# Patient Record
Sex: Female | Born: 1955 | Race: White | Hispanic: No | Smoking: Never smoker
Health system: Southern US, Community
[De-identification: ages and names within clinical notes are randomized; demographics above are authoritative.]

## PROBLEM LIST (undated history)

## (undated) DIAGNOSIS — G8929 Other chronic pain: Secondary | ICD-10-CM

## (undated) DIAGNOSIS — Z972 Presence of dental prosthetic device (complete) (partial): Secondary | ICD-10-CM

## (undated) DIAGNOSIS — M797 Fibromyalgia: Secondary | ICD-10-CM

## (undated) DIAGNOSIS — I10 Essential (primary) hypertension: Secondary | ICD-10-CM

## (undated) DIAGNOSIS — G43909 Migraine, unspecified, not intractable, without status migrainosus: Secondary | ICD-10-CM

## (undated) DIAGNOSIS — R5382 Chronic fatigue, unspecified: Secondary | ICD-10-CM

## (undated) DIAGNOSIS — C801 Malignant (primary) neoplasm, unspecified: Secondary | ICD-10-CM

## (undated) HISTORY — PX: ABDOMINAL HYSTERECTOMY: SHX81

## (undated) HISTORY — PX: NASAL SINUS SURGERY: SHX719

## (undated) HISTORY — DX: Essential (primary) hypertension: I10

---

## 2005-12-23 ENCOUNTER — Ambulatory Visit: Payer: Self-pay | Admitting: Internal Medicine

## 2006-04-10 ENCOUNTER — Ambulatory Visit: Payer: Self-pay | Admitting: Podiatry

## 2007-04-29 ENCOUNTER — Ambulatory Visit: Payer: Self-pay | Admitting: Internal Medicine

## 2007-11-22 ENCOUNTER — Ambulatory Visit: Payer: Self-pay | Admitting: Unknown Physician Specialty

## 2007-12-16 ENCOUNTER — Ambulatory Visit: Payer: Self-pay | Admitting: Unknown Physician Specialty

## 2008-04-03 ENCOUNTER — Ambulatory Visit: Payer: Self-pay | Admitting: Internal Medicine

## 2008-05-05 ENCOUNTER — Ambulatory Visit: Payer: Self-pay | Admitting: Internal Medicine

## 2009-05-08 ENCOUNTER — Ambulatory Visit: Payer: Self-pay | Admitting: Internal Medicine

## 2010-05-21 ENCOUNTER — Ambulatory Visit: Payer: Self-pay | Admitting: Internal Medicine

## 2011-05-22 ENCOUNTER — Ambulatory Visit: Payer: Self-pay | Admitting: Internal Medicine

## 2011-06-03 ENCOUNTER — Ambulatory Visit: Payer: Self-pay | Admitting: Internal Medicine

## 2012-07-05 ENCOUNTER — Ambulatory Visit: Payer: Self-pay | Admitting: Internal Medicine

## 2013-07-21 ENCOUNTER — Ambulatory Visit: Payer: Self-pay | Admitting: Internal Medicine

## 2014-09-05 ENCOUNTER — Ambulatory Visit: Payer: Self-pay | Admitting: Internal Medicine

## 2014-09-18 ENCOUNTER — Ambulatory Visit: Payer: Self-pay | Admitting: Internal Medicine

## 2014-09-18 ENCOUNTER — Emergency Department: Payer: Self-pay | Admitting: Emergency Medicine

## 2014-09-26 ENCOUNTER — Ambulatory Visit: Payer: Self-pay | Admitting: Internal Medicine

## 2014-09-26 HISTORY — PX: BREAST BIOPSY: SHX20

## 2014-10-26 ENCOUNTER — Ambulatory Visit
Admit: 2014-10-26 | Disposition: A | Discharge status: Home / Self Care | Payer: Self-pay | Attending: Surgery | Admitting: Surgery

## 2014-10-26 DIAGNOSIS — I10 Essential (primary) hypertension: Secondary | ICD-10-CM

## 2014-10-26 LAB — CBC WITH DIFFERENTIAL/PLATELET
BASOS PCT: 0.7 %
Basophil #: 0.1 10*3/uL (ref 0.0–0.1)
EOS ABS: 0.2 10*3/uL (ref 0.0–0.7)
Eosinophil %: 2.3 %
HCT: 39.1 % (ref 35.0–47.0)
HGB: 13.4 g/dL (ref 12.0–16.0)
Lymphocyte #: 2 10*3/uL (ref 1.0–3.6)
Lymphocyte %: 27.7 %
MCH: 30.5 pg (ref 26.0–34.0)
MCHC: 34.2 g/dL (ref 32.0–36.0)
MCV: 89 fL (ref 80–100)
MONO ABS: 0.4 x10 3/mm (ref 0.2–0.9)
Monocyte %: 5.7 %
NEUTROS PCT: 63.6 %
Neutrophil #: 4.6 10*3/uL (ref 1.4–6.5)
Platelet: 239 10*3/uL (ref 150–440)
RBC: 4.39 10*6/uL (ref 3.80–5.20)
RDW: 12.9 % (ref 11.5–14.5)
WBC: 7.2 10*3/uL (ref 3.6–11.0)

## 2014-10-26 LAB — BASIC METABOLIC PANEL
ANION GAP: 5 — AB (ref 7–16)
BUN: 12 mg/dL
Calcium, Total: 9.9 mg/dL
Chloride: 101 mmol/L
Co2: 34 mmol/L — ABNORMAL HIGH
Creatinine: 0.72 mg/dL
EGFR (Non-African Amer.): >60
GLUCOSE: 104 mg/dL — AB
POTASSIUM: 3.8 mmol/L
SODIUM: 140 mmol/L

## 2014-11-02 ENCOUNTER — Ambulatory Visit
Admit: 2014-11-02 | Disposition: A | Discharge status: Home / Self Care | Payer: Self-pay | Attending: Surgery | Admitting: Surgery

## 2014-11-02 HISTORY — PX: BREAST BIOPSY: SHX20

## 2014-11-06 LAB — SURGICAL PATHOLOGY

## 2014-11-12 NOTE — Op Note (Signed)
PATIENT NAME:  Carla Gomez, Carla Gomez MR#:  [id number] DATE OF BIRTH:  05/31/1956  DATE OF PROCEDURE:  11/02/2014  PREOPERATIVE DIAGNOSIS: Left breast intraductal papilloma.   POSTOPERATIVE DIAGNOSIS: Left breast intraductal papilloma.   PROCEDURE PERFORMED: Left breast needle localization, excisional biopsy.   SURGEON: Erick Murin A. Marina Gravel, MD   ASSISTANT: None.   ANESTHESIA: General with local, 30 mL of 0.25% plain Marcaine.   FINDINGS: Specimen mammogram was sufficient.   ESTIMATED BLOOD LOSS: Minimal.   DESCRIPTION OF PROCEDURE: With the patient was brought to the Operating Room. The left breast was sterilely prepped and draped with ChloraPrep solution. Timeout was observed. A 3.5 cm curvilinear incision in the left inner lower quadrant was fashioned. Subdermal flaps were raised. The wire was brought through this incision. Stay suture of 3-0 Vicryl was placed. Excisional biopsy was then performed in the direction of the wire path. Specimen radiograph was sufficient.   Two hemoclips were placed at the base of the wound for future mammographic detection.   With hemostasis being ensured. A total of 30 mL of 0.25% plain Marcaine was infiltrated along the operative field.   The wound was then closed in multiple layers of 3-0 Vicryl and 4-0 running subcuticular in the skin with 4-0 Vicryl. Steri-Strips, Telfa and Tegaderm were then applied and the patient was subsequently extubated and taken to the recovery room in stable and satisfactory condition by anesthesia services.    ____________________________ Jeannette How Marina Gravel, MD mab:AT D: 11/02/2014 10:50:57 ET T: 11/02/2014 14:55:54 ET JOB#: [id number]  cc: Elta Guadeloupe A. Marina Gravel, MD, <Dictator> Leona Carry. Hall Busing, MD Deziah Renwick Bettina Gavia MD ELECTRONICALLY SIGNED 11/08/2014 12:37

## 2015-08-27 ENCOUNTER — Other Ambulatory Visit: Payer: Self-pay | Admitting: Internal Medicine

## 2015-08-27 DIAGNOSIS — Z1231 Encounter for screening mammogram for malignant neoplasm of breast: Secondary | ICD-10-CM

## 2015-09-10 ENCOUNTER — Ambulatory Visit: Payer: Self-pay

## 2015-09-11 ENCOUNTER — Ambulatory Visit
Admission: RE | Admit: 2015-09-11 | Discharge: 2015-09-11 | Disposition: A | Discharge status: Home / Self Care | Payer: Medicare Other | Source: Ambulatory Visit | Attending: Internal Medicine | Admitting: Internal Medicine

## 2015-09-11 DIAGNOSIS — Z1231 Encounter for screening mammogram for malignant neoplasm of breast: Secondary | ICD-10-CM | POA: Diagnosis present

## 2015-09-11 HISTORY — DX: Malignant (primary) neoplasm, unspecified: C80.1

## 2016-06-02 ENCOUNTER — Other Ambulatory Visit: Payer: Self-pay | Admitting: Internal Medicine

## 2016-06-02 DIAGNOSIS — R891 Abnormal level of hormones in specimens from other organs, systems and tissues: Secondary | ICD-10-CM

## 2016-06-02 DIAGNOSIS — R7989 Other specified abnormal findings of blood chemistry: Secondary | ICD-10-CM

## 2016-06-02 DIAGNOSIS — Z780 Asymptomatic menopausal state: Secondary | ICD-10-CM

## 2016-06-10 ENCOUNTER — Ambulatory Visit: Admission: RE | Admit: 2016-06-10 | Payer: Medicare Other | Source: Ambulatory Visit

## 2016-06-22 IMAGING — MG MM DIGITAL SCREENING BILAT W/ CAD
2 series · 5 of 5 positions shown · non-contrast
Comparison: Previous exam(s).

CLINICAL DATA: Screening.

EXAM:
DIGITAL SCREENING BILATERAL MAMMOGRAM WITH CAD

[R CC · right · 4 of 4 slices shown]
[im 1/4]
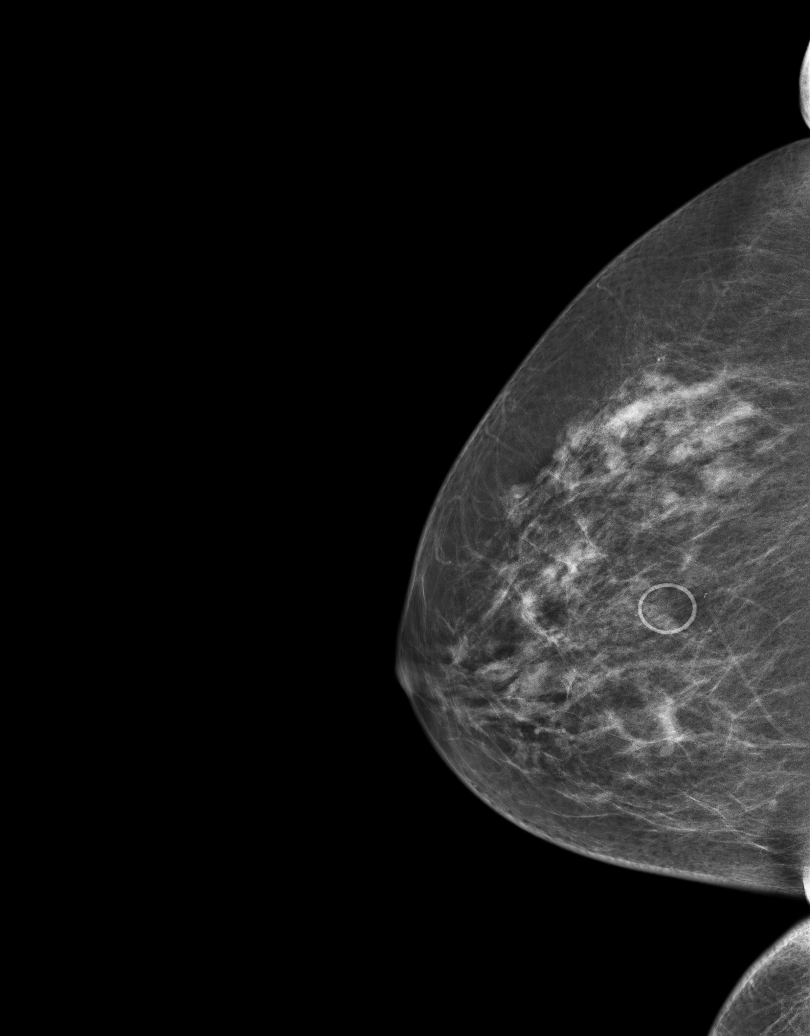
[im 2/4]
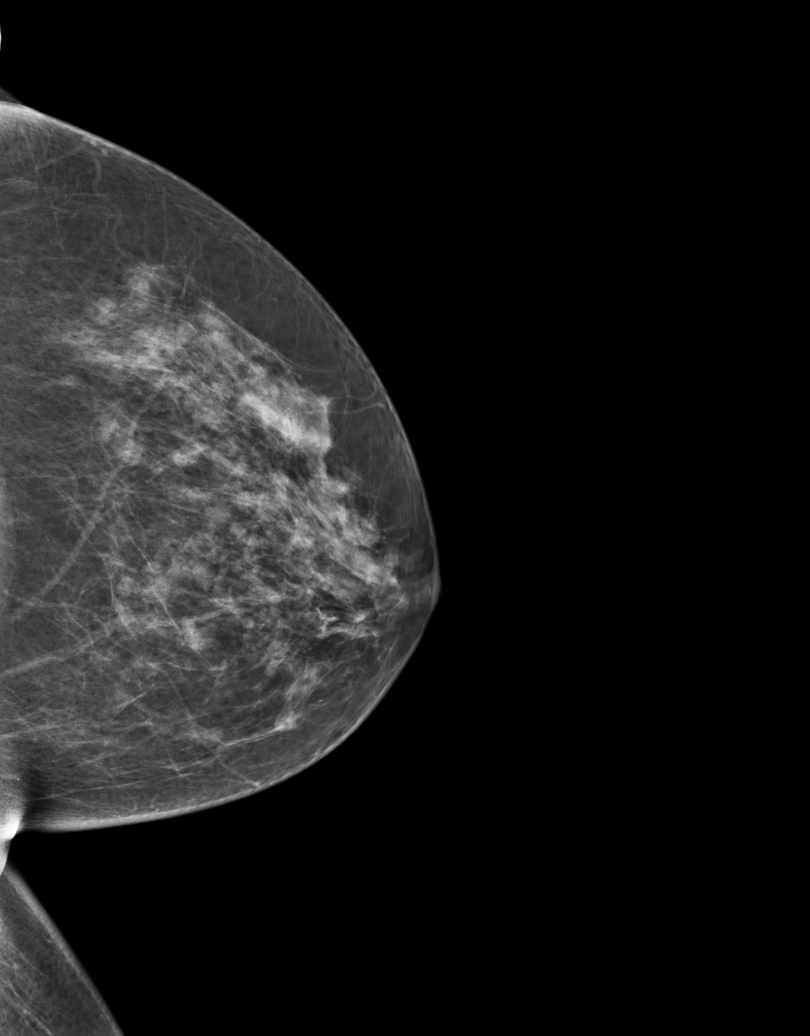
[im 3/4]
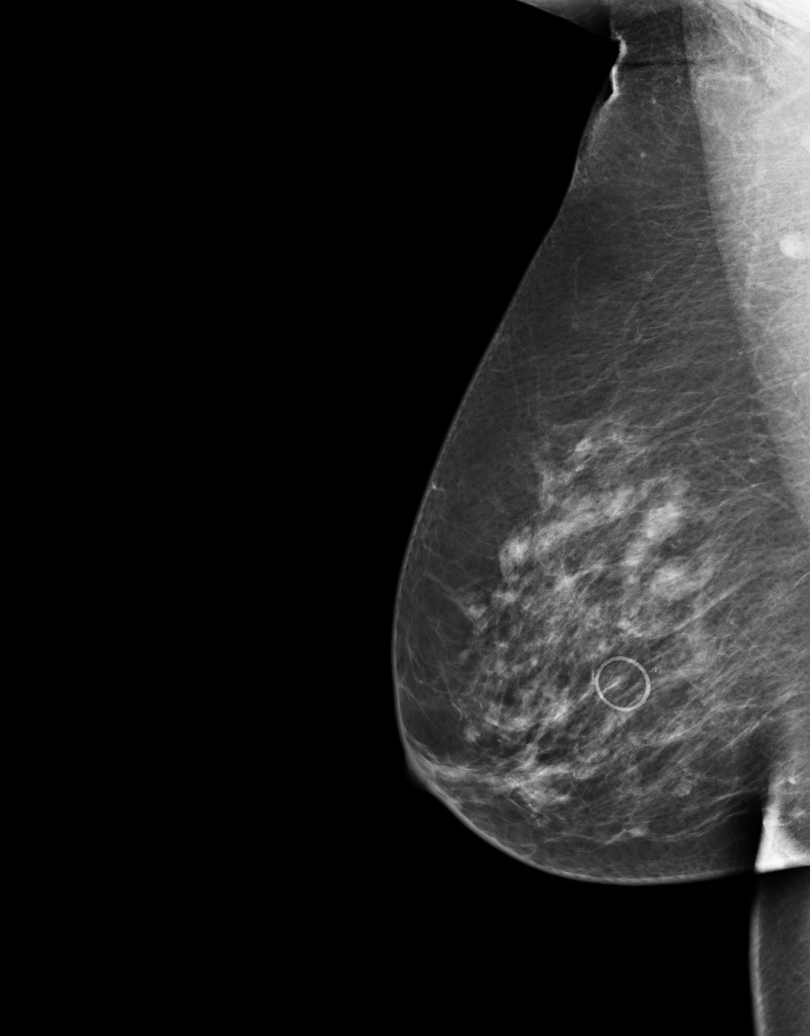
[im 4/4]
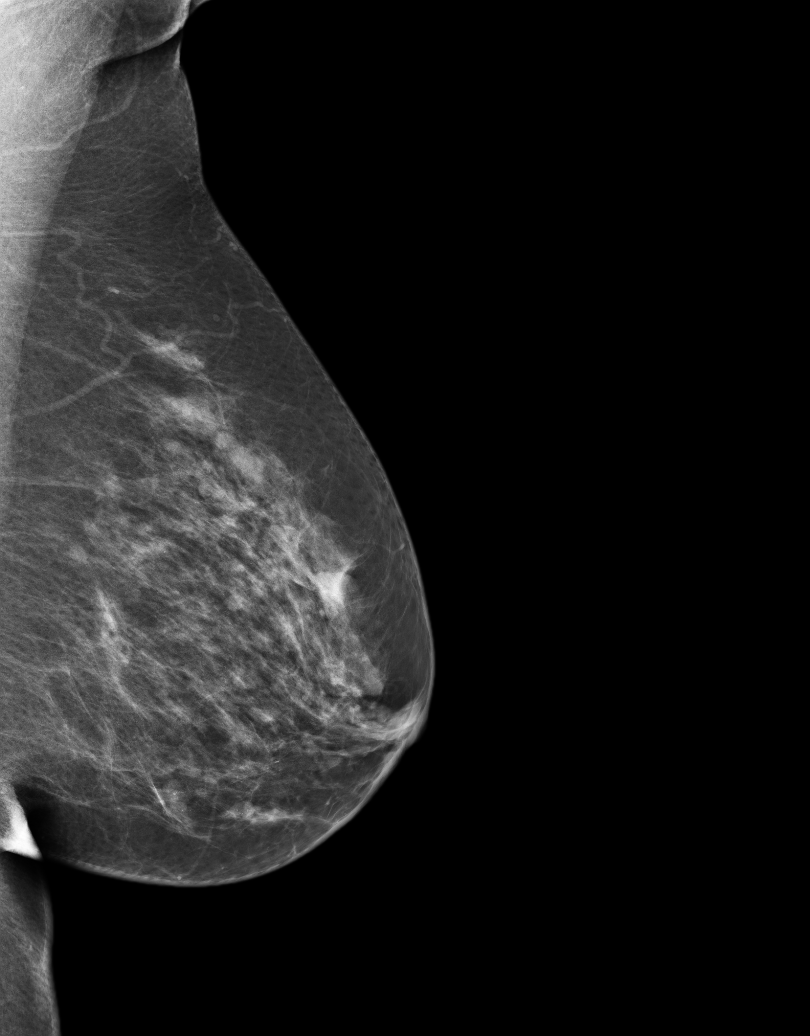

[L MLO]
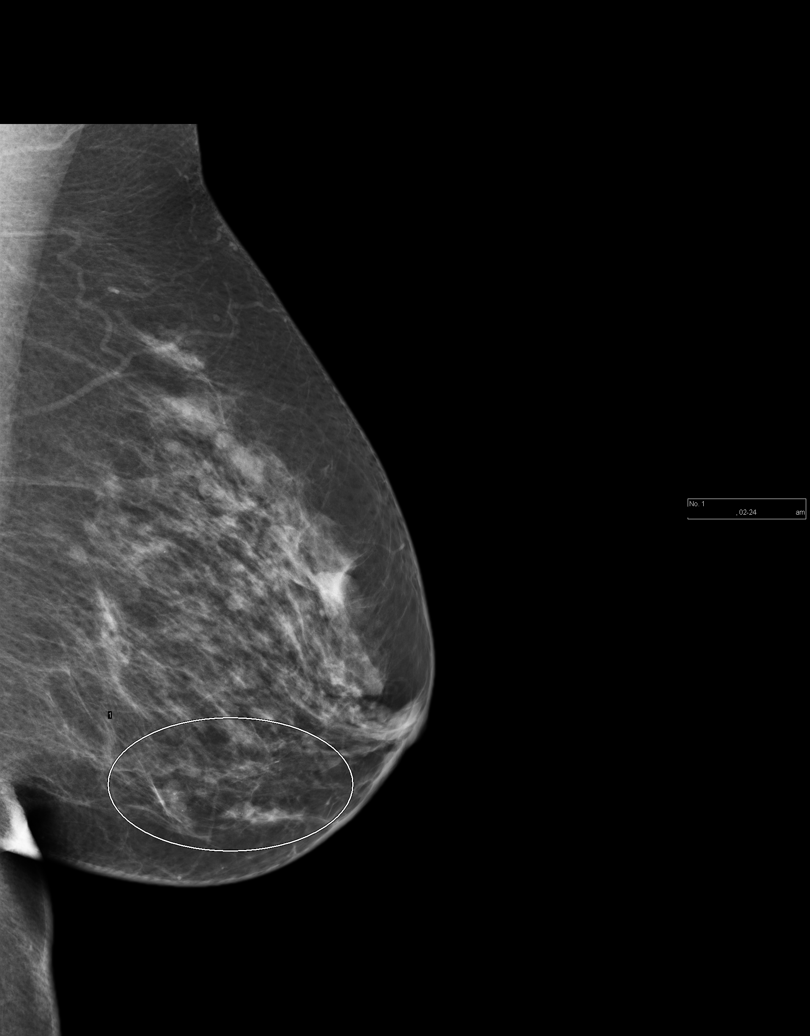

[5 of 5 positions shown; findings below may reference images not displayed]

ACR Breast Density Category b: There are scattered areas of
fibroglandular density.
FINDINGS: In the left breast, calcifications warrant further evaluation with
magnified views. In the right breast, no findings suspicious for
malignancy. Images were processed with CAD.
IMPRESSION: Further evaluation is suggested for calcifications in the left
breast.

RECOMMENDATION:
Diagnostic mammogram of the left breast. (Code:[id number]-[id number])

The patient will be contacted regarding the findings, and additional
imaging will be scheduled.

BI-RADS CATEGORY  0: Incomplete. Need additional imaging evaluation
and/or prior mammograms for comparison.

## 2016-09-23 ENCOUNTER — Other Ambulatory Visit: Payer: Self-pay | Admitting: Internal Medicine

## 2016-09-23 DIAGNOSIS — Z780 Asymptomatic menopausal state: Secondary | ICD-10-CM

## 2016-09-23 DIAGNOSIS — Z1231 Encounter for screening mammogram for malignant neoplasm of breast: Secondary | ICD-10-CM

## 2016-10-01 ENCOUNTER — Ambulatory Visit: Payer: Medicare Other

## 2016-10-01 ENCOUNTER — Other Ambulatory Visit: Payer: Medicare Other

## 2016-10-06 ENCOUNTER — Inpatient Hospital Stay: Admission: RE | Admit: 2016-10-06 | Payer: Medicare Other | Source: Ambulatory Visit

## 2016-10-06 ENCOUNTER — Ambulatory Visit: Payer: Medicare Other

## 2016-10-07 ENCOUNTER — Ambulatory Visit
Admission: RE | Admit: 2016-10-07 | Discharge: 2016-10-07 | Disposition: A | Discharge status: Home / Self Care | Payer: Medicare Other | Source: Ambulatory Visit | Attending: Internal Medicine | Admitting: Internal Medicine

## 2016-10-07 DIAGNOSIS — Z1231 Encounter for screening mammogram for malignant neoplasm of breast: Secondary | ICD-10-CM | POA: Diagnosis not present

## 2016-10-07 DIAGNOSIS — Z780 Asymptomatic menopausal state: Secondary | ICD-10-CM | POA: Diagnosis present

## 2017-10-05 ENCOUNTER — Other Ambulatory Visit: Payer: Self-pay | Admitting: Internal Medicine

## 2017-10-05 DIAGNOSIS — Z1231 Encounter for screening mammogram for malignant neoplasm of breast: Secondary | ICD-10-CM

## 2017-10-13 ENCOUNTER — Ambulatory Visit
Admission: RE | Admit: 2017-10-13 | Discharge: 2017-10-13 | Disposition: A | Discharge status: Home / Self Care | Payer: Medicare Other | Source: Ambulatory Visit | Attending: Internal Medicine | Admitting: Internal Medicine

## 2017-10-13 DIAGNOSIS — R928 Other abnormal and inconclusive findings on diagnostic imaging of breast: Secondary | ICD-10-CM | POA: Diagnosis not present

## 2017-10-13 DIAGNOSIS — Z1231 Encounter for screening mammogram for malignant neoplasm of breast: Secondary | ICD-10-CM | POA: Diagnosis present

## 2017-10-15 ENCOUNTER — Other Ambulatory Visit: Payer: Self-pay | Admitting: Internal Medicine

## 2017-10-15 DIAGNOSIS — N6320 Unspecified lump in the left breast, unspecified quadrant: Secondary | ICD-10-CM

## 2017-10-15 DIAGNOSIS — R928 Other abnormal and inconclusive findings on diagnostic imaging of breast: Secondary | ICD-10-CM

## 2017-10-22 ENCOUNTER — Ambulatory Visit
Admission: RE | Admit: 2017-10-22 | Discharge: 2017-10-22 | Disposition: A | Discharge status: Home / Self Care | Payer: Medicare Other | Source: Ambulatory Visit | Attending: Internal Medicine | Admitting: Internal Medicine

## 2017-10-22 DIAGNOSIS — N6320 Unspecified lump in the left breast, unspecified quadrant: Secondary | ICD-10-CM

## 2017-10-22 DIAGNOSIS — R928 Other abnormal and inconclusive findings on diagnostic imaging of breast: Secondary | ICD-10-CM

## 2018-06-21 ENCOUNTER — Other Ambulatory Visit: Payer: Self-pay | Admitting: Internal Medicine

## 2018-06-21 DIAGNOSIS — R109 Unspecified abdominal pain: Secondary | ICD-10-CM

## 2018-06-23 ENCOUNTER — Encounter (INDEPENDENT_AMBULATORY_CARE_PROVIDER_SITE_OTHER): Payer: Self-pay

## 2018-06-23 ENCOUNTER — Ambulatory Visit
Admission: RE | Admit: 2018-06-23 | Discharge: 2018-06-23 | Disposition: A | Discharge status: Home / Self Care | Payer: Medicare Other | Source: Ambulatory Visit | Attending: Internal Medicine | Admitting: Internal Medicine

## 2018-06-23 DIAGNOSIS — K8020 Calculus of gallbladder without cholecystitis without obstruction: Secondary | ICD-10-CM | POA: Diagnosis not present

## 2018-06-23 DIAGNOSIS — K828 Other specified diseases of gallbladder: Secondary | ICD-10-CM | POA: Diagnosis not present

## 2018-06-23 DIAGNOSIS — R109 Unspecified abdominal pain: Secondary | ICD-10-CM | POA: Diagnosis not present

## 2020-01-13 ENCOUNTER — Other Ambulatory Visit: Payer: Self-pay | Admitting: Internal Medicine

## 2020-01-13 DIAGNOSIS — Z1231 Encounter for screening mammogram for malignant neoplasm of breast: Secondary | ICD-10-CM

## 2020-01-13 DIAGNOSIS — E2839 Other primary ovarian failure: Secondary | ICD-10-CM

## 2020-01-25 ENCOUNTER — Other Ambulatory Visit: Payer: Self-pay

## 2020-01-25 ENCOUNTER — Ambulatory Visit
Admission: RE | Admit: 2020-01-25 | Discharge: 2020-01-25 | Disposition: A | Discharge status: Home / Self Care | Payer: Medicare Other | Source: Ambulatory Visit | Attending: Internal Medicine | Admitting: Internal Medicine

## 2020-01-25 DIAGNOSIS — E2839 Other primary ovarian failure: Secondary | ICD-10-CM | POA: Diagnosis not present

## 2020-01-25 DIAGNOSIS — Z13820 Encounter for screening for osteoporosis: Secondary | ICD-10-CM | POA: Insufficient documentation

## 2020-01-25 DIAGNOSIS — Z1231 Encounter for screening mammogram for malignant neoplasm of breast: Secondary | ICD-10-CM | POA: Diagnosis present

## 2020-01-25 DIAGNOSIS — Z780 Asymptomatic menopausal state: Secondary | ICD-10-CM | POA: Insufficient documentation

## 2020-05-09 ENCOUNTER — Other Ambulatory Visit: Payer: Self-pay | Admitting: Internal Medicine

## 2020-05-09 DIAGNOSIS — R4182 Altered mental status, unspecified: Secondary | ICD-10-CM

## 2020-05-16 ENCOUNTER — Ambulatory Visit
Admission: RE | Admit: 2020-05-16 | Discharge: 2020-05-16 | Disposition: A | Discharge status: Home / Self Care | Payer: Medicare Other | Source: Ambulatory Visit | Attending: Internal Medicine | Admitting: Internal Medicine

## 2020-05-16 ENCOUNTER — Other Ambulatory Visit: Payer: Self-pay

## 2020-05-16 ENCOUNTER — Encounter (INDEPENDENT_AMBULATORY_CARE_PROVIDER_SITE_OTHER): Payer: Self-pay

## 2020-05-16 DIAGNOSIS — R4182 Altered mental status, unspecified: Secondary | ICD-10-CM | POA: Diagnosis not present

## 2020-11-07 ENCOUNTER — Encounter: Payer: Self-pay | Admitting: Obstetrics & Gynecology

## 2020-11-07 ENCOUNTER — Ambulatory Visit: Payer: Medicare Other | Admitting: Obstetrics & Gynecology

## 2020-11-07 ENCOUNTER — Other Ambulatory Visit: Payer: Self-pay

## 2020-11-07 VITALS — BP 140/90 | Ht 64.0 in | Wt 160.0 lb

## 2020-11-07 DIAGNOSIS — N907 Vulvar cyst: Secondary | ICD-10-CM | POA: Insufficient documentation

## 2020-11-07 DIAGNOSIS — N8111 Cystocele, midline: Secondary | ICD-10-CM | POA: Insufficient documentation

## 2020-11-07 DIAGNOSIS — R1032 Left lower quadrant pain: Secondary | ICD-10-CM | POA: Insufficient documentation

## 2020-11-07 NOTE — Progress Notes (Signed)
Abdominal Pain Patient is a 65 yo G2P2 WF who presents for evaluation of abdominal pain. The pain is described as sharp and shooting, and is 7/10 in intensity. Pain is located in the LLQ area without radiation. Onset was cyclical occurring for about one week each month predictably and starting/ occurring several years ago. Symptoms have been gradually worsening since. Aggravating factors: none. Alleviating factors: none. Associated symptoms: severe migraine type headaches during this week as well, also bloating.  The patient denies constipation, diarrhea, fever, nausea and vomiting. Risk factors for pelvic/abdominal pain include prior h/o ovarian cysts when younger.  She has had TVH 30 years ago for prolapse.  Additional Complaints: Cystocele/Rectocele Patient complains of a cystocele. Problem started several months ago. Symptoms include: prolapse of tissue with straining, discomfort: mild and having to push bladder back up at times when she voids.  Denies leakage.  She does have urge and nocturia (altohough gets up at night frequently for pain and IBS also). Symptoms have gradually worsened.   Additional Complaints: Pt also has cyst on her vulvas that is bothersome.  She first notcied this 2 years ago, and grew to a point but has somewhat stop growing of recent months.  It is slightly tender, and irritative as she walks, exercises, etc.  No drainage.  No bleeding.   PMHx: She  has a past medical history of Cancer ([place]) and Hypertension. Also,  has a past surgical history that includes Abdominal hysterectomy; Breast biopsy (Left, 09/26/14); and Breast biopsy (Left, 11/02/14)., family history includes Breast cancer in her maternal aunt.,  reports that she has never smoked. She has never used smokeless tobacco. She reports that she does not drink alcohol and does not use drugs.  She has a current medication list which includes the following prescription(s): alprazolam, amlodipine, cyclobenzaprine,  hydrocodone-acetaminophen, metoprolol succinate, pravastatin, and sumatriptan. Also, is allergic to grass pollen(k-o-r-t-swt vern).  Review of Systems  Constitutional: Positive for malaise/fatigue. Negative for chills and fever.  HENT: Negative for congestion, sinus pain and sore throat.   Eyes: Negative for blurred vision and pain.  Respiratory: Negative for cough and wheezing.   Cardiovascular: Negative for chest pain and leg swelling.  Gastrointestinal: Positive for abdominal pain. Negative for constipation, diarrhea, heartburn, nausea and vomiting.  Genitourinary: Positive for urgency. Negative for dysuria, frequency and hematuria.  Musculoskeletal: Positive for joint pain, myalgias and neck pain. Negative for back pain.  Skin: Negative for itching and rash.  Neurological: Positive for headaches. Negative for dizziness, tremors and weakness.  Endo/Heme/Allergies: Does not bruise/bleed easily.  Psychiatric/Behavioral: Negative for depression. The patient is nervous/anxious. The patient does not have insomnia.     Objective: BP 140/90   Ht 5\' 4"  (1.626 m)   Wt 160 lb (72.6 kg)   BMI 27.46 kg/m  Physical Exam Constitutional:      General: She is not in acute distress.    Appearance: She is well-developed.  Genitourinary:     Bladder and urethral meatus normal.     Genitourinary Comments: Cuff intact/ no lesions  Absent uterus and cervix     Right Labia: lesions.     Right Labia: No rash or tenderness.    Left Labia: No tenderness, lesions or rash.    Vulva exam comments: 2 cm mobile mildly T cyst, no drg.        Vaginal cuff intact.    No vaginal erythema or bleeding.     Anterior vaginal prolapse present.    Mild vaginal atrophy  present.     Right Adnexa: not tender and no mass present.    Left Adnexa: tender.    Left Adnexa: no mass present.    Cervix is absent.     Uterus is absent.     Bladder exam comments: Gr 3 cystocele.     Pelvic exam was performed with  patient in the lithotomy position.  HENT:     Head: Normocephalic and atraumatic.     Nose: Nose normal.  Abdominal:     General: There is no distension.     Palpations: Abdomen is soft.     Tenderness: There is no abdominal tenderness.  Musculoskeletal:        General: Normal range of motion.  Neurological:     Mental Status: She is alert and oriented to person, place, and time.     Cranial Nerves: No cranial nerve deficit.  Skin:    General: Skin is warm and dry.  Psychiatric:        Attention and Perception: Attention normal.        Mood and Affect: Mood normal.        Speech: Speech normal.        Behavior: Behavior normal.        Cognition and Memory: Cognition normal.        Judgment: Judgment normal.    Pessary Fitting Patient presents for a pessary fitting. She desires a pessary as her means of controlling her symptoms of prolapse and/or urinary incontinence. She understands the care needed for a pessary and desires to proceed. Alternative treatment options have been discussed at length and the patient voices an understanding of each option.   PROCEDURE: The patient was placed in dorsal lithotomy position. Examination confirmed prolapse. A 3 ring type pessary resulted in expulsion.  A 2 1/4 size gellhorn was fitted without difficulty. The patient subsequently ambulated, voided and performed valsalva maneuvers without dislodging the pessary and with mild discomfort and feeling the knob. She does not desire a pessary after this fitting.   ASSESSMENT/PLAN:    Problem List Items Addressed This Visit    Labial cyst - Primary    Low likelihood for cancer    Pt would like excision   Midline cystocele    Pessary not a good option for patient    Consideration for Anterior repair discussed.    No sling needed as no incontinence   LLQ pain    [place] to assess for cyst as etiology for cyclical pain    Options if present discussed    Otherwise, would cont w non-surgical tx of pains     Plan would be for surgery to excise cyst and cystocele repair, and anything else based on ultrasound.  Will discuss after [place].    Info given to patient.  A total of 75 minutes were spent face-to-face with the patient as well as preparation, review, communication, and documentation during this encounter.  Multiple problems addressed.     Barnett Applebaum, MD, [facility], [facility] 11/07/2020  3:48 PM

## 2020-11-07 NOTE — Addendum Note (Signed)
Addended by: Gae Dry on: 11/07/2020 03:52 PM   Modules accepted: Orders

## 2020-11-07 NOTE — Patient Instructions (Signed)
Transvaginal Ultrasound A transvaginal ultrasound, also called an endovaginal ultrasound, is a test that uses sound waves to take pictures of the female genital tract. The pictures are taken with a device, called a transducer, that is placed in the vagina. This test may be done to:  Check for problems with your pregnancy.  Check your developing baby.  Check for anything abnormal in the uterus or ovaries.  Find out why you have pelvic pain or bleeding. Tell a health care provider about:  Any allergies you have.  All medicines you are taking, including vitamins, herbs, eye drops, creams, and over-the-counter medicines.  Any blood disorders you have.  Any surgeries you have had.  Any medical conditions you have.  Whether you are pregnant or may be pregnant.  Whether you are having your menstrual period. What are the risks? This is a safe procedure. There are no known risks or complications of having this test. What happens before the procedure? This procedure needs to be done when your bladder is empty. Follow your health care provider's instructions about drinking fluids and emptying your bladder before the test. What happens during the procedure?  You will empty your bladder before the procedure.  You will undress from the waist down.  You will lie down on an exam table, with your knees bent and your feet in foot holders.  A health care provider will cover the transducer with a sterile cover.  A gel will be put on the transducer. The gel helps transmit the sound waves and prevents irritation of your vagina.  The technician will insert the transducer into your vagina to get images. These will be displayed on a monitor that looks like a small television screen.  The transducer will be removed when the procedure is complete. The procedure may vary among health care providers and hospitals.   What happens after the procedure?  It is up to you to get the results of your  procedure. Ask your health care provider, or the department that is doing the procedure, when your results will be ready.  Keep all follow-up visits as told by your health care provider. This is important. Summary  A transvaginal ultrasound, also called an endovaginal ultrasound, is a test that uses sound waves to take pictures of the female genital tract.  This is a safe procedure. There are no known risks associated with this test.  The procedure needs to be done when your bladder is empty. Follow your health care provider's instructions about drinking fluids and emptying your bladder before the test.  During the procedure, you will undress from the waist down and lie down on an exam table. A technician will insert a transducer into your vagina to obtain images.  Ask your health care provider, or the department that is doing the procedure, when your results will be ready. This information is not intended to replace advice given to you by your health care provider. Make sure you discuss any questions you have with your health care provider. Document Revised: 02/10/2018 Document Reviewed: 02/10/2018 Elsevier Patient Education  2021 Elsevier Inc.  

## 2020-11-30 ENCOUNTER — Telehealth (INDEPENDENT_AMBULATORY_CARE_PROVIDER_SITE_OTHER): Payer: Self-pay | Admitting: Gastroenterology

## 2020-11-30 DIAGNOSIS — Z1211 Encounter for screening for malignant neoplasm of colon: Secondary | ICD-10-CM

## 2020-11-30 MED ORDER — NA SULFATE-K SULFATE-MG SULF 17.5-3.13-1.6 GM/177ML PO SOLN: 1.0000 | Freq: Once | ORAL | 0 refills | Status: AC

## 2020-11-30 NOTE — Progress Notes (Signed)
Gastroenterology Pre-Procedure Review  Request Date: 12/21/2020 Requesting Physician: Dr. Allen Norris   PATIENT REVIEW QUESTIONS: The patient responded to the following health history questions as indicated:    1. Are you having any GI issues? No, Hx of IBS  2. Do you have a personal history of Polyps? No, cologuard was positive  3. Do you have a family history of Colon Cancer or Polyps? no 4. Diabetes Mellitus? no 5. Joint replacements in the past 12 months?no 6. Major health problems in the past 3 months?no 7. Any artificial heart valves, MVP, or defibrillator?no    MEDICATIONS & ALLERGIES:    Patient reports the following regarding taking any anticoagulation/antiplatelet therapy:   Plavix, Coumadin, Eliquis, Xarelto, Lovenox, Pradaxa, Brilinta, or Effient? no Aspirin? No   Patient confirms/reports the following medications:  Current Outpatient Medications  Medication Sig Dispense Refill  . ALPRAZolam (XANAX) 1 MG tablet Take 1 mg by mouth 4 (four) times daily.    Marland Kitchen amLODipine (NORVASC) 5 MG tablet Take 1 tablet by mouth every morning.    . cyclobenzaprine (FLEXERIL) 10 MG tablet Take 1 tablet by mouth 3 (three) times daily.    Marland Kitchen HYDROcodone-acetaminophen (NORCO) 10-325 MG tablet Take 1-2 tablets by mouth every 6 (six) hours.    . metoprolol succinate (TOPROL-XL) 25 MG 24 hr tablet Take 1.5 tablets by mouth 2 (two) times daily.    . pravastatin (PRAVACHOL) 40 MG tablet SMARTSIG:1 Tablet(s) By Mouth Every Evening    . SUMAtriptan (IMITREX) 100 MG tablet Take 100 mg by mouth daily as needed.     No current facility-administered medications for this visit.    Patient confirms/reports the following allergies:  Allergies  Allergen Reactions  . Grass Pollen(K-O-R-T-Swt Vern)     No orders of the defined types were placed in this encounter.   AUTHORIZATION INFORMATION Primary Insurance: 1D#: Group #:  Secondary Insurance: 1D#: Group #:  SCHEDULE INFORMATION: Date:  12/21/2020 Time: Location: [facility]

## 2020-12-04 ENCOUNTER — Telehealth: Payer: Self-pay

## 2020-12-04 NOTE — Telephone Encounter (Signed)
Pt calling; now has colonoscopy sched for 6/10th; transvaginal u/s on the 13th and f/u c PH on the 14th.  Will the colonoscopy be problematic for the u/s.  [phone]  Adv pt it will not be problematic; to leave appointments as they are.

## 2020-12-06 ENCOUNTER — Encounter: Payer: Self-pay | Admitting: Gastroenterology

## 2020-12-21 ENCOUNTER — Encounter
Admission: RE | Disposition: A | Discharge status: Home / Self Care | Payer: Self-pay | Source: Home / Self Care | Attending: Gastroenterology

## 2020-12-21 ENCOUNTER — Ambulatory Visit
Admission: RE | Admit: 2020-12-21 | Discharge: 2020-12-21 | Disposition: A | Discharge status: Home / Self Care | Payer: Medicare Other | Attending: Gastroenterology | Admitting: Gastroenterology

## 2020-12-21 ENCOUNTER — Ambulatory Visit: Payer: Medicare Other | Admitting: Anesthesiology

## 2020-12-21 ENCOUNTER — Other Ambulatory Visit: Payer: Self-pay

## 2020-12-21 ENCOUNTER — Encounter: Payer: Self-pay | Admitting: Gastroenterology

## 2020-12-21 DIAGNOSIS — I10 Essential (primary) hypertension: Secondary | ICD-10-CM | POA: Insufficient documentation

## 2020-12-21 DIAGNOSIS — R195 Other fecal abnormalities: Secondary | ICD-10-CM | POA: Insufficient documentation

## 2020-12-21 DIAGNOSIS — Z79899 Other long term (current) drug therapy: Secondary | ICD-10-CM | POA: Diagnosis not present

## 2020-12-21 DIAGNOSIS — J301 Allergic rhinitis due to pollen: Secondary | ICD-10-CM | POA: Diagnosis not present

## 2020-12-21 DIAGNOSIS — Z1211 Encounter for screening for malignant neoplasm of colon: Secondary | ICD-10-CM

## 2020-12-21 DIAGNOSIS — Z5309 Procedure and treatment not carried out because of other contraindication: Secondary | ICD-10-CM | POA: Insufficient documentation

## 2020-12-21 HISTORY — PX: COLONOSCOPY WITH PROPOFOL: SHX5780

## 2020-12-21 HISTORY — DX: Migraine, unspecified, not intractable, without status migrainosus: G43.909

## 2020-12-21 HISTORY — DX: Presence of dental prosthetic device (complete) (partial): Z97.2

## 2020-12-21 HISTORY — DX: Other chronic pain: G89.29

## 2020-12-21 HISTORY — DX: Chronic fatigue, unspecified: R53.82

## 2020-12-21 HISTORY — DX: Fibromyalgia: M79.7

## 2020-12-21 SURGERY — COLONOSCOPY WITH PROPOFOL
Anesthesia: General | Site: Rectum

## 2020-12-21 MED ORDER — PROPOFOL 10 MG/ML IV BOLUS
INTRAVENOUS | Status: DC | PRN
  Administered 2020-12-21: 100 mg via INTRAVENOUS

## 2020-12-21 MED ORDER — LACTATED RINGERS IV SOLN: INTRAVENOUS | Status: DC

## 2020-12-21 MED ORDER — STERILE WATER FOR IRRIGATION IR SOLN: Status: DC | PRN

## 2020-12-21 MED ORDER — LIDOCAINE HCL (CARDIAC) PF 100 MG/5ML IV SOSY
PREFILLED_SYRINGE | INTRAVENOUS | Status: DC | PRN
  Administered 2020-12-21: 30 mg via INTRAVENOUS

## 2020-12-21 MED ORDER — SODIUM CHLORIDE 0.9 % IV SOLN
INTRAVENOUS | Status: DC
Start: 2020-12-21 — End: 2020-12-21

## 2020-12-21 SURGICAL SUPPLY — 6 items
GOWN CVR UNV OPN BCK APRN NK (MISCELLANEOUS) ×2 IMPLANT
GOWN ISOL THUMB LOOP REG UNIV (MISCELLANEOUS) ×6
KIT PRC NS LF DISP ENDO (KITS) ×1 IMPLANT
KIT PROCEDURE OLYMPUS (KITS) ×3
MANIFOLD NEPTUNE II (INSTRUMENTS) ×3 IMPLANT
WATER STERILE IRR 250ML POUR (IV SOLUTION) ×3 IMPLANT

## 2020-12-21 NOTE — H&P (Signed)
Lucilla Lame, MD [address] [place],  [address] Phone:[phone] Fax : [phone]  Primary Care Physician:  Albina Billet, MD Primary Gastroenterologist:  Dr. Allen Norris  Pre-Procedure History & Physical: HPI:  Carla Gomez is a 65 y.o. female is here for an colonoscopy.   Past Medical History:  Diagnosis Date   Cancer ([place])    basal cell   Chronic fatigue    Chronic pain    Dental bridge present    permanent bottom right   Fibromyalgia    Hypertension    Migraine headache     Past Surgical History:  Procedure Laterality Date   ABDOMINAL HYSTERECTOMY     BREAST BIOPSY Left 09/26/14   lt bx/clip- benign   BREAST BIOPSY Left 11/02/14   benign   NASAL SINUS SURGERY      Prior to Admission medications   Medication Sig Start Date End Date Taking? Authorizing Provider  ALPRAZolam Duanne Moron) 1 MG tablet Take 1 mg by mouth 4 (four) times daily. 10/29/20  Yes [provider]  amLODipine (NORVASC) 5 MG tablet Take 1 tablet by mouth every morning. 09/18/20  Yes [provider]  Cholecalciferol (VITAMIN D3) 50 MCG (2000 UT) TABS Take by mouth daily.   Yes [provider]  cyclobenzaprine (FLEXERIL) 10 MG tablet Take 1 tablet by mouth 3 (three) times daily. 10/13/20  Yes [provider]  HYDROcodone-acetaminophen (NORCO) 10-325 MG tablet Take 1-2 tablets by mouth every 6 (six) hours. 10/18/20  Yes [provider]  metoprolol succinate (TOPROL-XL) 25 MG 24 hr tablet Take 1.5 tablets by mouth 2 (two) times daily. 09/21/20  Yes [provider]  morphine (KADIAN) 100 MG 24 hr capsule Take 100 mg by mouth daily.   Yes [provider]  Multiple Vitamin (MULTIVITAMIN) tablet Take 1 tablet by mouth daily.   Yes [provider]  pravastatin (PRAVACHOL) 40 MG tablet SMARTSIG:1 Tablet(s) By Mouth Every Evening 10/01/20  Yes [provider]  SUMAtriptan (IMITREX) 100 MG tablet Take 100 mg by mouth  daily as needed. 05/24/20  Yes [provider]    Allergies as of 11/30/2020 - Review Complete 11/30/2020  Allergen Reaction Noted   Grass pollen(k-o-r-t-swt vern)  11/07/2020    Family History  Problem Relation Age of Onset   Breast cancer Maternal Aunt        mat great aunt    Social History   Socioeconomic History   Marital status: Divorced    Spouse name: Not on file   Number of children: Not on file   Years of education: Not on file   Highest education level: Not on file  Occupational History   Not on file  Tobacco Use   Smoking status: Never   Smokeless tobacco: Never  Vaping Use   Vaping Use: Never used  Substance and Sexual Activity   Alcohol use: Never   Drug use: Never   Sexual activity: Not Currently  Other Topics Concern   Not on file  Social History Narrative   Not on file   Social Determinants of Health   Financial Resource Strain: Not on file  Food Insecurity: Not on file  Transportation Needs: Not on file  Physical Activity: Not on file  Stress: Not on file  Social Connections: Not on file  Intimate Partner Violence: Not on file    Review of Systems: See HPI, otherwise negative ROS  Physical Exam: BP 134/89   Pulse (!) 125  Temp 97.7 F (36.5 C) (Temporal)   Resp 20   Ht 5\' 4"  (1.626 m)   Wt 70.8 kg   SpO2 97%   BMI 26.78 kg/m  General:   Alert,  pleasant and cooperative in NAD Head:  Normocephalic and atraumatic. Neck:  Supple; no masses or thyromegaly. Lungs:  Clear throughout to auscultation.    Heart:  Regular rate and rhythm. Abdomen:  Soft, nontender and nondistended. Normal bowel sounds, without guarding, and without rebound.   Neurologic:  Alert and  oriented x4;  grossly normal neurologically.  Impression/Plan: Carla Gomez is here for an colonoscopy to be performed for positive cologuard  Risks, benefits, limitations, and alternatives regarding  colonoscopy have been reviewed with the patient.   Questions have been answered.  All parties agreeable.   Lucilla Lame, MD  12/21/2020, 11:19 AM

## 2020-12-21 NOTE — Anesthesia Preprocedure Evaluation (Signed)
Anesthesia Evaluation  Patient identified by MRN, date of birth, ID band Patient awake    Reviewed: NPO status   History of Anesthesia Complications Negative for: history of anesthetic complications  Airway Mallampati: II  TM Distance: >3 FB Neck ROM: full    Dental no notable dental hx.    Pulmonary neg pulmonary ROS,    Pulmonary exam normal        Cardiovascular Exercise Tolerance: Good hypertension, Normal cardiovascular exam     Neuro/Psych  Headaches, Anxiety Fibromyalgia;  Chronic pain;    GI/Hepatic negative GI ROS, Neg liver ROS,   Endo/Other  negative endocrine ROS  Renal/GU negative Renal ROS  negative genitourinary   Musculoskeletal  (+) Fibromyalgia -  Abdominal   Peds  Hematology negative hematology ROS (+)   Anesthesia Other Findings   Reproductive/Obstetrics                             Anesthesia Physical Anesthesia Plan  ASA: 2  Anesthesia Plan: General   Post-op Pain Management:    Induction:   PONV Risk Score and Plan: 3 and Propofol infusion, TIVA and Ondansetron  Airway Management Planned:   Additional Equipment:   Intra-op Plan:   Post-operative Plan:   Informed Consent: I have reviewed the patients History and Physical, chart, labs and discussed the procedure including the risks, benefits and alternatives for the proposed anesthesia with the patient or authorized representative who has indicated his/her understanding and acceptance.       Plan Discussed with: CRNA  Anesthesia Plan Comments:         Anesthesia Quick Evaluation

## 2020-12-21 NOTE — Op Note (Signed)
[facility] Gastroenterology Patient Name: Carla Gomez Procedure Date: 12/21/2020 11:51 AM MRN: [id number] Account #: [id number] Date of Birth: [birth year] Admit Type: Outpatient Age: 65 Room: [facility] OR ROOM 01 Gender: Female Note Status: Finalized Procedure:             Colonoscopy Indications:           Positive Cologuard test Providers:             Lucilla Lame MD, MD Referring MD:          Leona Carry. Hall Busing, MD (Referring MD) Medicines:             Propofol per Anesthesia Complications:         No immediate complications. Procedure:             Pre-Anesthesia Assessment:                        - Prior to the procedure, a History and Physical was                         performed, and patient medications and allergies were                         reviewed. The patient's tolerance of previous                         anesthesia was also reviewed. The risks and benefits                         of the procedure and the sedation options and risks                         were discussed with the patient. All questions were                         answered, and informed consent was obtained. Prior                         Anticoagulants: The patient has taken no previous                         anticoagulant or antiplatelet agents. ASA Grade                         Assessment: II - A patient with mild systemic disease.                         After reviewing the risks and benefits, the patient                         was deemed in satisfactory condition to undergo the                         procedure.                        After obtaining informed consent, the colonoscope was  passed under direct vision. Throughout the procedure,                         the patient's blood pressure, pulse, and oxygen                         saturations were monitored continuously. The was                         introduced through the anus with the intention of                          advancing to the cecum. The scope was advanced to the                         sigmoid colon before the procedure was aborted.                         Medications were given. The colonoscopy was performed                         without difficulty. The patient tolerated the                         procedure well. The quality of the bowel preparation                         was not adequate to identify polyps 6 mm and larger in                         size. Findings:      The perianal and digital rectal examinations were normal.      A large amount of stool was found in the rectum and in the sigmoid       colon, precluding visualization. Impression:            - Preparation of the colon was inadequate.                        - Stool in the rectum and in the sigmoid colon.                        - No specimens collected. Recommendation:        - Discharge patient to home.                        - Resume previous diet.                        - Continue present medications.                        - Repeat colonoscopy because the bowel preparation was                         poor. Procedure Code(s):     --- Professional ---                        [phone], 73, Colonoscopy, flexible; diagnostic,  including collection of specimen(s) by brushing or                         washing, when performed (separate procedure) Diagnosis Code(s):     --- Professional ---                        R19.5, Other fecal abnormalities CPT copyright 2019 American Medical Association. All rights reserved. The codes documented in this report are preliminary and upon coder review may  be revised to meet current compliance requirements. Lucilla Lame MD, MD 12/21/2020 12:04:15 PM This report has been signed electronically. Number of Addenda: 0 Note Initiated On: 12/21/2020 11:51 AM Total Procedure Duration: 0 hours 3 minutes 3 seconds  Estimated Blood Loss:  Estimated blood loss:  none.      [facility]

## 2020-12-21 NOTE — Anesthesia Postprocedure Evaluation (Signed)
Anesthesia Post Note  Patient: Location manager  Procedure(s) Performed: COLONOSCOPY WITH PROPOFOL (Rectum)     Patient location during evaluation: PACU Anesthesia Type: General Level of consciousness: awake and alert Pain management: pain level controlled Vital Signs Assessment: post-procedure vital signs reviewed and stable Respiratory status: spontaneous breathing, nonlabored ventilation, respiratory function stable and patient connected to nasal cannula oxygen Cardiovascular status: blood pressure returned to baseline and stable Postop Assessment: no apparent nausea or vomiting Anesthetic complications: no   No notable events documented. : Procedure aborted intraop by GI, d/t poor colon prep.  Fidel Levy

## 2020-12-21 NOTE — Anesthesia Procedure Notes (Signed)
Date/Time: 12/21/2020 11:57 AM Performed by: Cameron Ali, CRNA Pre-anesthesia Checklist: Patient identified, Emergency Drugs available, Suction available, Timeout performed and Patient being monitored Patient Re-evaluated:Patient Re-evaluated prior to induction Oxygen Delivery Method: Nasal cannula Placement Confirmation: positive ETCO2

## 2020-12-21 NOTE — Transfer of Care (Signed)
Immediate Anesthesia Transfer of Care Note  Patient: Carla Gomez  Procedure(s) Performed: COLONOSCOPY WITH PROPOFOL (Rectum)  Patient Location: PACU  Anesthesia Type: General  Level of Consciousness: awake, alert  and patient cooperative  Airway and Oxygen Therapy: Patient Spontanous Breathing and Patient connected to supplemental oxygen  Post-op Assessment: Post-op Vital signs reviewed, Patient's Cardiovascular Status Stable, Respiratory Function Stable, Patent Airway and No signs of Nausea or vomiting  Post-op Vital Signs: Reviewed and stable  Complications: No notable events documented.

## 2020-12-24 ENCOUNTER — Encounter: Payer: Self-pay | Admitting: Gastroenterology

## 2020-12-25 ENCOUNTER — Ambulatory Visit: Payer: Medicare Other | Admitting: Obstetrics & Gynecology

## 2020-12-25 ENCOUNTER — Other Ambulatory Visit: Payer: Self-pay

## 2020-12-25 ENCOUNTER — Encounter: Payer: Self-pay | Admitting: Obstetrics & Gynecology

## 2020-12-25 VITALS — BP 130/80 | Ht 64.0 in | Wt 156.0 lb

## 2020-12-25 DIAGNOSIS — N907 Vulvar cyst: Secondary | ICD-10-CM | POA: Diagnosis not present

## 2020-12-25 DIAGNOSIS — N8111 Cystocele, midline: Secondary | ICD-10-CM | POA: Diagnosis not present

## 2020-12-25 NOTE — Progress Notes (Signed)
  HPI: Pt has mild sx's from labial cyst (no change in size in 1-2 years according to her) and her only sx w GU is urgency; denies LOU or GSI.  She has had some mild LQ pains, prior hyst, and was worried about ovarian cysts.  Ultrasound demonstrates no masses seen, no ovarian cysts These findings are Pelvis normal  PMHx: She  has a past medical history of Cancer ([place]), Chronic fatigue, Chronic pain, Dental bridge present, Fibromyalgia, Hypertension, and Migraine headache. Also,  has a past surgical history that includes Abdominal hysterectomy; Breast biopsy (Left, 09/26/14); Breast biopsy (Left, 11/02/14); Nasal sinus surgery; and Colonoscopy with propofol (N/A, 12/21/2020)., family history includes Breast cancer in her maternal aunt.,  reports that she has never smoked. She has never used smokeless tobacco. She reports that she does not drink alcohol and does not use drugs.  She has a current medication list which includes the following prescription(s): alprazolam, amlodipine, vitamin d3, cyclobenzaprine, hydrocodone-acetaminophen, morphine, multivitamin, pravastatin, sumatriptan, and metoprolol succinate. Also, is allergic to citrus, grass pollen([id number]-o-r-t-swt vern), and latex.  Review of Systems  All other systems reviewed and are negative.  Objective: BP 130/80   Ht 5\' 4"  (1.626 m)   Wt 156 lb (70.8 kg)   BMI 26.78 kg/m   Physical examination Constitutional NAD, Conversant  Skin No rashes, lesions or ulceration.   Extremities: Moves all appropriately.  Normal ROM for age. No lymphadenopathy.  Neuro: Grossly intact  Psych: Oriented to PPT.  Normal mood. Normal affect.   [place] at [facility], see report  Assessment:  Labial cyst  Midline cystocele  No surgery rec at this time. If labial; cyst grows, can excise. If urgency worsens, consider medical tx.  If incontinence or prolapse develops, consider AR.  A total of 25 minutes were spent face-to-face with the patient as well as preparation,  review, communication, and documentation during this encounter.   Carla Applebaum, MD, [facility], [facility] 12/25/2020  3:27 PM

## 2021-11-29 ENCOUNTER — Emergency Department: Payer: Medicare Other

## 2021-11-29 ENCOUNTER — Other Ambulatory Visit: Payer: Self-pay

## 2021-11-29 ENCOUNTER — Inpatient Hospital Stay
Admission: EM | Admit: 2021-11-29 | Discharge: 2021-12-05 | DRG: 682 | Disposition: A | Discharge status: Other Acute Inpatient Hospital | Payer: Medicare Other | Attending: Internal Medicine | Admitting: Internal Medicine

## 2021-11-29 DIAGNOSIS — N179 Acute kidney failure, unspecified: Principal | ICD-10-CM | POA: Diagnosis present

## 2021-11-29 DIAGNOSIS — R5382 Chronic fatigue, unspecified: Secondary | ICD-10-CM | POA: Diagnosis present

## 2021-11-29 DIAGNOSIS — R600 Localized edema: Secondary | ICD-10-CM | POA: Diagnosis not present

## 2021-11-29 DIAGNOSIS — Z79891 Long term (current) use of opiate analgesic: Secondary | ICD-10-CM

## 2021-11-29 DIAGNOSIS — R700 Elevated erythrocyte sedimentation rate: Secondary | ICD-10-CM | POA: Diagnosis present

## 2021-11-29 DIAGNOSIS — M797 Fibromyalgia: Secondary | ICD-10-CM | POA: Diagnosis present

## 2021-11-29 DIAGNOSIS — G9341 Metabolic encephalopathy: Secondary | ICD-10-CM | POA: Diagnosis present

## 2021-11-29 DIAGNOSIS — G43909 Migraine, unspecified, not intractable, without status migrainosus: Secondary | ICD-10-CM | POA: Diagnosis present

## 2021-11-29 DIAGNOSIS — F05 Delirium due to known physiological condition: Secondary | ICD-10-CM | POA: Diagnosis present

## 2021-11-29 DIAGNOSIS — G894 Chronic pain syndrome: Secondary | ICD-10-CM | POA: Diagnosis present

## 2021-11-29 DIAGNOSIS — I10 Essential (primary) hypertension: Secondary | ICD-10-CM | POA: Diagnosis present

## 2021-11-29 DIAGNOSIS — D649 Anemia, unspecified: Secondary | ICD-10-CM | POA: Diagnosis present

## 2021-11-29 DIAGNOSIS — M6282 Rhabdomyolysis: Secondary | ICD-10-CM | POA: Diagnosis present

## 2021-11-29 DIAGNOSIS — N390 Urinary tract infection, site not specified: Secondary | ICD-10-CM | POA: Diagnosis present

## 2021-11-29 DIAGNOSIS — E860 Dehydration: Secondary | ICD-10-CM | POA: Diagnosis present

## 2021-11-29 DIAGNOSIS — E876 Hypokalemia: Secondary | ICD-10-CM | POA: Diagnosis present

## 2021-11-29 DIAGNOSIS — G8929 Other chronic pain: Secondary | ICD-10-CM | POA: Diagnosis not present

## 2021-11-29 DIAGNOSIS — R000 Tachycardia, unspecified: Secondary | ICD-10-CM | POA: Diagnosis present

## 2021-11-29 DIAGNOSIS — R918 Other nonspecific abnormal finding of lung field: Secondary | ICD-10-CM | POA: Diagnosis present

## 2021-11-29 DIAGNOSIS — G253 Myoclonus: Secondary | ICD-10-CM | POA: Diagnosis not present

## 2021-11-29 DIAGNOSIS — Z602 Problems related to living alone: Secondary | ICD-10-CM | POA: Diagnosis present

## 2021-11-29 DIAGNOSIS — E8770 Fluid overload, unspecified: Secondary | ICD-10-CM | POA: Diagnosis not present

## 2021-11-29 DIAGNOSIS — E785 Hyperlipidemia, unspecified: Secondary | ICD-10-CM | POA: Diagnosis present

## 2021-11-29 DIAGNOSIS — F419 Anxiety disorder, unspecified: Secondary | ICD-10-CM | POA: Diagnosis present

## 2021-11-29 DIAGNOSIS — Z79899 Other long term (current) drug therapy: Secondary | ICD-10-CM

## 2021-11-29 DIAGNOSIS — Z803 Family history of malignant neoplasm of breast: Secondary | ICD-10-CM

## 2021-11-29 DIAGNOSIS — R0989 Other specified symptoms and signs involving the circulatory and respiratory systems: Secondary | ICD-10-CM | POA: Diagnosis not present

## 2021-11-29 DIAGNOSIS — R4182 Altered mental status, unspecified: Principal | ICD-10-CM

## 2021-11-29 DIAGNOSIS — E663 Overweight: Secondary | ICD-10-CM | POA: Diagnosis present

## 2021-11-29 DIAGNOSIS — G40901 Epilepsy, unspecified, not intractable, with status epilepticus: Secondary | ICD-10-CM | POA: Diagnosis not present

## 2021-11-29 DIAGNOSIS — J690 Pneumonitis due to inhalation of food and vomit: Secondary | ICD-10-CM

## 2021-11-29 DIAGNOSIS — E871 Hypo-osmolality and hyponatremia: Secondary | ICD-10-CM | POA: Diagnosis present

## 2021-11-29 DIAGNOSIS — T82838A Hemorrhage of vascular prosthetic devices, implants and grafts, initial encounter: Secondary | ICD-10-CM | POA: Diagnosis not present

## 2021-11-29 DIAGNOSIS — R569 Unspecified convulsions: Secondary | ICD-10-CM | POA: Diagnosis not present

## 2021-11-29 DIAGNOSIS — E8721 Acute metabolic acidosis: Secondary | ICD-10-CM | POA: Diagnosis present

## 2021-11-29 DIAGNOSIS — Z9109 Other allergy status, other than to drugs and biological substances: Secondary | ICD-10-CM

## 2021-11-29 DIAGNOSIS — E162 Hypoglycemia, unspecified: Secondary | ICD-10-CM | POA: Diagnosis not present

## 2021-11-29 DIAGNOSIS — Z6829 Body mass index (BMI) 29.0-29.9, adult: Secondary | ICD-10-CM | POA: Diagnosis not present

## 2021-11-29 DIAGNOSIS — G9340 Encephalopathy, unspecified: Secondary | ICD-10-CM | POA: Diagnosis not present

## 2021-11-29 DIAGNOSIS — N184 Chronic kidney disease, stage 4 (severe): Secondary | ICD-10-CM | POA: Diagnosis not present

## 2021-11-29 DIAGNOSIS — Z91040 Latex allergy status: Secondary | ICD-10-CM

## 2021-11-29 LAB — CBC
HCT: 28.4 % — ABNORMAL LOW (ref 36.0–46.0)
Hemoglobin: 9.9 g/dL — ABNORMAL LOW (ref 12.0–15.0)
MCH: 28.8 pg (ref 26.0–34.0)
MCHC: 34.9 g/dL (ref 30.0–36.0)
MCV: 82.6 fL (ref 80.0–100.0)
Platelets: 155 10*3/uL (ref 150–400)
RBC: 3.44 MIL/uL — ABNORMAL LOW (ref 3.87–5.11)
RDW: 12.8 % (ref 11.5–15.5)
WBC: 8.9 10*3/uL (ref 4.0–10.5)
nRBC: 0 % (ref 0.0–0.2)

## 2021-11-29 LAB — CK: Total CK: 1375 U/L — ABNORMAL HIGH (ref 38–234)

## 2021-11-29 LAB — COMPREHENSIVE METABOLIC PANEL
ALT: 29 U/L (ref 0–44)
AST: 19 U/L (ref 15–41)
Albumin: 3.7 g/dL (ref 3.5–5.0)
Alkaline Phosphatase: 96 U/L (ref 38–126)
Anion gap: 27 — ABNORMAL HIGH (ref 5–15)
BUN: 123 mg/dL — ABNORMAL HIGH (ref 8–23)
CO2: 15 mmol/L — ABNORMAL LOW (ref 22–32)
Calcium: 8.8 mg/dL — ABNORMAL LOW (ref 8.9–10.3)
Chloride: 81 mmol/L — ABNORMAL LOW (ref 98–111)
Creatinine, Ser: 16.28 mg/dL — ABNORMAL HIGH (ref 0.44–1.00)
GFR, Estimated: 2 mL/min — ABNORMAL LOW (ref >60)
Glucose, Bld: 130 mg/dL — ABNORMAL HIGH (ref 70–99)
Potassium: 5.1 mmol/L (ref 3.5–5.1)
Sodium: 123 mmol/L — ABNORMAL LOW (ref 135–145)
Total Bilirubin: 1.4 mg/dL — ABNORMAL HIGH (ref 0.3–1.2)
Total Protein: 7.7 g/dL (ref 6.5–8.1)

## 2021-11-29 LAB — URINALYSIS, ROUTINE W REFLEX MICROSCOPIC
Bilirubin Urine: NEGATIVE
Glucose, UA: NEGATIVE mg/dL
Ketones, ur: NEGATIVE mg/dL
Leukocytes,Ua: NEGATIVE
Nitrite: NEGATIVE
Protein, ur: 100 mg/dL — AB
Specific Gravity, Urine: 1.011 (ref 1.005–1.030)
WBC, UA: >50 WBC/hpf — ABNORMAL HIGH (ref 0–5)
pH: 5 (ref 5.0–8.0)

## 2021-11-29 LAB — URINE DRUG SCREEN, QUALITATIVE (ARMC ONLY)
Amphetamines, Ur Screen: NOT DETECTED
Barbiturates, Ur Screen: NOT DETECTED
Benzodiazepine, Ur Scrn: POSITIVE — AB
Cannabinoid 50 Ng, Ur ~~LOC~~: NOT DETECTED
Cocaine Metabolite,Ur ~~LOC~~: NOT DETECTED
MDMA (Ecstasy)Ur Screen: NOT DETECTED
Methadone Scn, Ur: NOT DETECTED
Opiate, Ur Screen: POSITIVE — AB
Phencyclidine (PCP) Ur S: NOT DETECTED
Tricyclic, Ur Screen: POSITIVE — AB

## 2021-11-29 LAB — LACTIC ACID, PLASMA
Lactic Acid, Venous: 0.6 mmol/L (ref 0.5–1.9)
Lactic Acid, Venous: 0.7 mmol/L (ref 0.5–1.9)

## 2021-11-29 LAB — CBG MONITORING, ED: Glucose-Capillary: 137 mg/dL — ABNORMAL HIGH (ref 70–99)

## 2021-11-29 MED ORDER — AMLODIPINE BESYLATE 5 MG PO TABS
5.0000 mg | ORAL_TABLET | Freq: Every morning | ORAL | Status: DC
  Administered 2021-11-30 – 2021-12-01 (×2): 5 mg via ORAL
  Filled 2021-11-29 (×4): qty 1

## 2021-11-29 MED ORDER — VITAMIN D 25 MCG (1000 UNIT) PO TABS
ORAL_TABLET | Freq: Every day | ORAL | Status: DC
  Administered 2021-11-30 – 2021-12-01 (×2): 1000 [IU] via ORAL
  Filled 2021-11-29 (×6): qty 1

## 2021-11-29 MED ORDER — ACETAMINOPHEN 650 MG RE SUPP
650.0000 mg | Freq: Four times a day (QID) | RECTAL | Status: DC | PRN
Start: 2021-11-29 — End: 2021-12-05

## 2021-11-29 MED ORDER — CYCLOBENZAPRINE HCL 10 MG PO TABS
10.0000 mg | ORAL_TABLET | Freq: Three times a day (TID) | ORAL | Status: DC
  Administered 2021-11-29 – 2021-12-01 (×5): 10 mg via ORAL
  Filled 2021-11-29 (×8): qty 1

## 2021-11-29 MED ORDER — ONDANSETRON HCL 4 MG/2ML IJ SOLN: 4.0000 mg | Freq: Four times a day (QID) | INTRAMUSCULAR | Status: DC | PRN

## 2021-11-29 MED ORDER — SUMATRIPTAN SUCCINATE 50 MG PO TABS
100.0000 mg | ORAL_TABLET | Freq: Every day | ORAL | Status: DC | PRN
  Filled 2021-11-29: qty 2

## 2021-11-29 MED ORDER — ONDANSETRON HCL 4 MG PO TABS: 4.0000 mg | ORAL_TABLET | Freq: Four times a day (QID) | ORAL | Status: DC | PRN

## 2021-11-29 MED ORDER — SODIUM CHLORIDE 0.9 % IV SOLN: INTRAVENOUS | Status: DC

## 2021-11-29 MED ORDER — MAGNESIUM HYDROXIDE 400 MG/5ML PO SUSP: 30.0000 mL | Freq: Every day | ORAL | Status: DC | PRN

## 2021-11-29 MED ORDER — PRAVASTATIN SODIUM 20 MG PO TABS: 40.0000 mg | ORAL_TABLET | Freq: Every day | ORAL | Status: DC

## 2021-11-29 MED ORDER — TRAZODONE HCL 50 MG PO TABS
25.0000 mg | ORAL_TABLET | Freq: Every evening | ORAL | Status: DC | PRN
  Administered 2021-12-01 – 2021-12-02 (×2): 25 mg via ORAL
  Filled 2021-11-29 (×2): qty 1

## 2021-11-29 MED ORDER — ACETAMINOPHEN 325 MG PO TABS: 650.0000 mg | ORAL_TABLET | Freq: Four times a day (QID) | ORAL | Status: DC | PRN

## 2021-11-29 MED ORDER — HYDROCODONE-ACETAMINOPHEN 10-325 MG PO TABS
1.0000 | ORAL_TABLET | Freq: Four times a day (QID) | ORAL | Status: DC
  Administered 2021-11-29 – 2021-11-30 (×2): 1 via ORAL
  Filled 2021-11-29 (×2): qty 1

## 2021-11-29 MED ORDER — ADULT MULTIVITAMIN W/MINERALS CH
1.0000 | ORAL_TABLET | Freq: Every day | ORAL | Status: DC
  Administered 2021-11-29 – 2021-11-30 (×2): 1 via ORAL
  Filled 2021-11-29 (×4): qty 1

## 2021-11-29 MED ORDER — HEPARIN SODIUM (PORCINE) 5000 UNIT/ML IJ SOLN
5000.0000 [IU] | Freq: Three times a day (TID) | INTRAMUSCULAR | Status: DC
  Administered 2021-11-29 – 2021-12-05 (×16): 5000 [IU] via SUBCUTANEOUS
  Filled 2021-11-29 (×16): qty 1

## 2021-11-29 MED ORDER — ALPRAZOLAM 0.5 MG PO TABS
1.0000 mg | ORAL_TABLET | Freq: Four times a day (QID) | ORAL | Status: DC
Start: 2021-11-29 — End: 2021-12-01
  Administered 2021-11-29 – 2021-11-30 (×2): 1 mg via ORAL
  Filled 2021-11-29 (×4): qty 2

## 2021-11-29 MED ORDER — SODIUM CHLORIDE 0.9 % IV BOLUS
1000.0000 mL | Freq: Once | INTRAVENOUS | Status: AC
  Administered 2021-11-29: 1000 mL via INTRAVENOUS

## 2021-11-29 NOTE — Assessment & Plan Note (Addendum)
-   continue statin therapy. 

## 2021-11-29 NOTE — Assessment & Plan Note (Addendum)
Unclear etiology.  Possibly prerenal plus mild rhabdo?  Patient on a strong amount of opiates and benzos and it is possible, that she may have taken some illicit substances although this is unknown.  ESR significantly elevated and work-up in progress.  Nephrology following.  Status post dialysis catheter placement and first hemodialysis session done 5/20.  Another incomplete session on 5/22.  No dialysis today.  Further dialysis per nephrology team.

## 2021-11-29 NOTE — Assessment & Plan Note (Addendum)
Mild.  Improving with IV fluids.  CK continues to trend downward 246> 129.

## 2021-11-29 NOTE — ED Triage Notes (Signed)
Pt somnolent with slurred speech, brought by EMS see first nurse note. Pt states "I was in the process of reducing my meds..." then stops talking. Per first nurse note who spoke to EMS, pt had unwitnessed fall at home and neighbors called EMS.   Pt is answering questions appropriately but slowly. States may have fainted today. Pt is keeping eyes closed. Pt attempting to give history but is repeating the same statement again and again.   Pt states "something happened to me..." and then said "I started going into withdrawel yesterday" and states takes oxycodone for chronic pain.   Provider at bedside examining pt and placing orders.

## 2021-11-29 NOTE — ED Provider Notes (Signed)
[facility] Provider Note    Event Date/Time   First MD Initiated Contact with Patient 11/29/21 2031     (approximate)   History   Altered Mental Status   HPI  Carla Gomez is a 66 y.o. female  who presents to the emergency department today via EMS after neighbors heard patient yelling. Patient herself is unable to give any significant history give altered mental status.       Physical Exam   Triage Vital Signs: ED Triage Vitals  Enc Vitals Group     BP 11/29/21 1833 (!) 121/59     Pulse Rate 11/29/21 1833 94     Resp 11/29/21 1833 20     Temp 11/29/21 1833 98.1 F (36.7 C)     Temp Source 11/29/21 1833 Oral     SpO2 11/29/21 1833 99 %     Weight 11/29/21 1834 175 lb (79.4 kg)     Height 11/29/21 1834 '5\' 4"'$  (1.626 m)     Head Circumference --      Peak Flow --      Pain Score 11/29/21 1834 0     Pain Loc --      Pain Edu? --      Excl. in [place]? --     Most recent vital signs: Vitals:   11/29/21 1833 11/29/21 2022  BP: (!) 121/59 124/70  Pulse: 94 90  Resp: 20 20  Temp: 98.1 F (36.7 C)   SpO2: 99% 100%   General: Awake, alert, not oriented. CV:  Good peripheral perfusion. Regular rate and rhythm. Resp:  Normal effort. Lungs clear. Abd:  No distention. Minimally tender in right upper quadrant.    ED Results / Procedures / Treatments   Labs (all labs ordered are listed, but only abnormal results are displayed) Labs Reviewed  COMPREHENSIVE METABOLIC PANEL - Abnormal; Notable for the following components:      Result Value   Sodium 123 (*)    Chloride 81 (*)    CO2 15 (*)    Glucose, Bld 130 (*)    BUN 123 (*)    Creatinine, Ser 16.28 (*)    Calcium 8.8 (*)    Total Bilirubin 1.4 (*)    GFR, Estimated 2 (*)    Anion gap 27 (*)    All other components within normal limits  CBC - Abnormal; Notable for the following components:   RBC 3.44 (*)    Hemoglobin 9.9 (*)    HCT 28.4 (*)    All other components within  normal limits  CK - Abnormal; Notable for the following components:   Total CK 1,375 (*)    All other components within normal limits  CBG MONITORING, ED - Abnormal; Notable for the following components:   Glucose-Capillary 137 (*)    All other components within normal limits  URINE DRUG SCREEN, QUALITATIVE ([facility] ONLY)  URINALYSIS, ROUTINE W REFLEX MICROSCOPIC     EKG  I, Nance Pear, attending physician, personally viewed and interpreted this EKG  EKG Time: 1829 Rate: 95 Rhythm: normal sinus rhythm Axis: left axis deviation Intervals: qtc 469 QRS: LVH ST changes: no st elevation Impression: abnormal ekg  RADIOLOGY I independently interpreted and visualized the CT head/cervical spine. My interpretation: No bleed Radiology interpretation:  IMPRESSION:  1.  No acute intracranial process.  2.  No acute fracture or traumatic listhesis in the cervical spine.  3. Patchy opacities in the peripheral left upper lobe, which  are  nonspecific but could be infectious or inflammatory. Consider CT  chest for further evaluation if clinically indicated.    PROCEDURES:  Critical Care performed: No  Procedures   MEDICATIONS ORDERED IN ED: Medications - No data to display   IMPRESSION / MDM / [place] / ED COURSE  I reviewed the triage vital signs and the nursing notes.                              Differential diagnosis includes, but is not limited to, intracranial process, infection, anemia.  Patient presented to the emergency department today because of concerns for altered mental status.  Apparently neighbors heard her yelling.  Patient is unable to give any history here.  Blood work is notable for elevated creatinine and BUN.  Sodium and chloride are low.  CK is elevated at 1500.  Do not see any evidence that patient has a history of end-stage renal disease on dialysis.  The patient was started on IV fluids.  Discussed with Dr. Holley Raring with nephrology.  Recommends  ultrasound at this time.  Discussed with Dr. Sidney Ace with the hospitalist service will plan on admission.   FINAL CLINICAL IMPRESSION(S) / ED DIAGNOSES   Final diagnoses:  Altered mental status, unspecified altered mental status type  AKI (acute kidney injury) ([place])       Note:  This document was prepared using Dragon voice recognition software and may include unintentional dictation errors.    Nance Pear, MD 11/29/21 2106

## 2021-11-29 NOTE — H&P (Addendum)
Home     [place]   PATIENT NAME: Carla Gomez    MR#:  [id number]  DATE OF BIRTH:  02-06-1956  DATE OF ADMISSION:  11/29/2021  PRIMARY CARE PHYSICIAN: Albina Billet, MD   Patient is coming from: Home  REQUESTING/REFERRING PHYSICIAN: Nance Pear, MD  CHIEF COMPLAINT:   Chief Complaint  Patient presents with   Altered Mental Status    HISTORY OF PRESENT ILLNESS:  Carla Gomez is a 66 y.o. Caucasian female with medical history significant for fibromyalgia, hypertension, chronic pain and migraine, who presented to the ER with acute onset of altered mental status.  Patient was apparently heard hollering by her neighbors who called EMS.  The patient was repeating herself and stuttering in the ER.  During my interview she was very slow to answer questions with occasional tangential thoughts and inappropriate answers.  She did deny any previous history of chronic kidney disease.  She has been having significant diminished urine output.  No paresthesias or focal muscle weakness.  She had no reported nausea or vomiting or diarrhea or fever or chills.  No chest pain or palpitations.  No cough or wheezing.  ED Course: When she came to the ER, BP was 121/59 with otherwise normal vital signs.    Labs revealed a UA that was positive for UTI with 100 protein more than 50 WBCs, 11-20 RBCs. Lactic acid was 0.6 and later 0.7 BMP showed hyponatremia 123 and hypokalemia of 81 and acidosis of with a CO2 of 15, glucose 130 BUN 123 and creatinine of 16.28 previously normal in 2016 with no other available renal functions and care everywhere.  Anion gap was 27 and calcium 8.8 with total bili 1.4.  Total CK was 1375.  CBC showed anemia with hemoglobin 9.9 hematocrit 28.4 previously normal.  EKG as reviewed by me : EKG showed normal sinus rhythm with a rate of 95 with minimal voltage criteria for LVH and poor R wave progression with Q waves inferiorly Imaging: Noncontrasted CT scan revealed no acute  intra abnormalities.  C-spine CT shows no acute fracture or traumatic listhesis of the cervical spine.  There are patchy opacities in the peripheral left upper lobe that are nonspecific but could be infectious or inflammatory with recommendation for chest CT.  Renal ultrasound was ordered and is currently pending.  The patient was given 1 L bolus of IV lactated Ringer.  She will be admitted to a medical telemetry bed for further evaluation and management.  PAST MEDICAL HISTORY:   Past Medical History:  Diagnosis Date   Cancer ([place])    basal cell   Chronic fatigue    Chronic pain    Dental bridge present    permanent bottom right   Fibromyalgia    Hypertension    Migraine headache     PAST SURGICAL HISTORY:   Past Surgical History:  Procedure Laterality Date   ABDOMINAL HYSTERECTOMY     BREAST BIOPSY Left 09/26/14   lt bx/clip- benign   BREAST BIOPSY Left 11/02/14   benign   COLONOSCOPY WITH PROPOFOL N/A 12/21/2020   Procedure: COLONOSCOPY WITH PROPOFOL;  Surgeon: Lucilla Lame, MD;  Location: [facility];  Service: Endoscopy;  Laterality: N/A;   NASAL SINUS SURGERY      SOCIAL HISTORY:   Social History   Tobacco Use   Smoking status: Never   Smokeless tobacco: Never  Substance Use Topics   Alcohol use: Never    FAMILY HISTORY:   Family  History  Problem Relation Age of Onset   Breast cancer Maternal Aunt        mat great aunt    DRUG ALLERGIES:   Allergies  Allergen Reactions   Citrus     Triggers migraines   Grass Pollen(K-O-R-T-Swt Vern)    Latex     Redness around mouth after dental procedures    REVIEW OF SYSTEMS:   ROS As per history of present illness. All pertinent systems were reviewed above. Constitutional, HEENT, cardiovascular, respiratory, GI, GU, musculoskeletal, neuro, psychiatric, endocrine, integumentary and hematologic systems were reviewed and are otherwise negative/unremarkable except for positive findings mentioned above in the  HPI.   MEDICATIONS AT HOME:   Prior to Admission medications   Medication Sig Start Date End Date Taking? Authorizing Provider  ALPRAZolam Duanne Moron) 1 MG tablet Take 1 mg by mouth 4 (four) times daily. 10/29/20   [provider]  amLODipine (NORVASC) 5 MG tablet Take 1 tablet by mouth every morning. 09/18/20   [provider]  Cholecalciferol (VITAMIN D3) 50 MCG (2000 UT) TABS Take by mouth daily.    [provider]  cyclobenzaprine (FLEXERIL) 10 MG tablet Take 1 tablet by mouth 3 (three) times daily. 10/13/20   [provider]  HYDROcodone-acetaminophen (NORCO) 10-325 MG tablet Take 1-2 tablets by mouth every 6 (six) hours. 10/18/20   [provider]  morphine (KADIAN) 100 MG 24 hr capsule Take 100 mg by mouth daily.    [provider]  Multiple Vitamin (MULTIVITAMIN) tablet Take 1 tablet by mouth daily.    [provider]  pravastatin (PRAVACHOL) 40 MG tablet SMARTSIG:1 Tablet(s) By Mouth Every Evening 10/01/20   [provider]  SUMAtriptan (IMITREX) 100 MG tablet Take 100 mg by mouth daily as needed. 05/24/20   [provider]      VITAL SIGNS:  Blood pressure 115/66, pulse 92, temperature 98.2 F (36.8 C), temperature source Oral, resp. rate 16, height '5\' 4"'$  (1.626 m), weight 74.6 kg, SpO2 95 %.  PHYSICAL EXAMINATION:  Physical Exam  GENERAL:  66 y.o.-year-old Caucasian female patient lying in the bed with no acute distress.  EYES: Pupils equal, round, reactive to light and accommodation. No scleral icterus. Extraocular muscles intact.  HEENT: Head atraumatic, normocephalic. Oropharynx and nasopharynx clear.  NECK:  Supple, no jugular venous distention. No thyroid enlargement, no tenderness.  LUNGS: Normal breath sounds bilaterally, no wheezing, rales,rhonchi or crepitation. No use of accessory muscles of respiration.  CARDIOVASCULAR: Regular rate and rhythm, S1, S2 normal. No murmurs, rubs, or gallops.   ABDOMEN: Soft, nondistended, nontender. Bowel sounds present. No organomegaly or mass.  EXTREMITIES: No pedal edema, cyanosis, or clubbing.  NEUROLOGIC: Cranial nerves II through XII are intact.  The patient was globally confused.  Her speech was slow with mild stuttering without slurring.  Muscle strength 5/5 in all extremities. Sensation intact. Gait not checked.  PSYCHIATRIC: The patient is alert and globally confused.  Normal affect and good eye contact. SKIN: No obvious rash, lesion, or ulcer.   LABORATORY PANEL:   CBC Recent Labs  Lab 11/29/21 1832  WBC 8.9  HGB 9.9*  HCT 28.4*  PLT 155   ------------------------------------------------------------------------------------------------------------------  Chemistries  Recent Labs  Lab 11/29/21 1832  NA 123*  K 5.1  CL 81*  CO2 15*  GLUCOSE 130*  BUN 123*  CREATININE 16.28*  CALCIUM 8.8*  AST 19  ALT 29  ALKPHOS 96  BILITOT 1.4*   ------------------------------------------------------------------------------------------------------------------  Cardiac Enzymes No results  for input(s): TROPONINI in the last 168 hours. ------------------------------------------------------------------------------------------------------------------  RADIOLOGY:  CT HEAD WO CONTRAST (5MM)  Result Date: 11/29/2021 CLINICAL DATA:  Fall EXAM: CT HEAD WITHOUT CONTRAST CT CERVICAL SPINE WITHOUT CONTRAST TECHNIQUE: Multidetector CT imaging of the head and cervical spine was performed following the standard protocol without intravenous contrast. Multiplanar CT image reconstructions of the cervical spine were also generated. RADIATION DOSE REDUCTION: This exam was performed according to the departmental dose-optimization program which includes automated exposure control, adjustment of the mA and/or kV according to patient size and/or use of iterative reconstruction technique. COMPARISON:  CT head 05/16/2020, no prior cervical spine CT. FINDINGS:  CT HEAD FINDINGS Brain: No evidence of acute infarction, hemorrhage, cerebral edema, mass, mass effect, or midline shift. No hydrocephalus or extra-axial fluid collection. Vascular: No hyperdense vessel. Skull: Normal. Negative for fracture or focal lesion. Sinuses/Orbits: Sequela of chronic right maxillary sinusitis and prior right maxillary antrostomy and right ethmoidectomy, with partial opacification of the right ethmoid air cells. The remaining paranasal sinuses are clear. The orbits are unremarkable. Other: The mastoid air cells are well aerated. CT CERVICAL SPINE FINDINGS Alignment: No listhesis. Skull base and vertebrae: No acute fracture. No primary bone lesion or focal pathologic process. Soft tissues and spinal canal: No prevertebral fluid or swelling. No visible canal hematoma. Disc levels: No high-grade spinal canal stenosis. Uncovertebral and facet arthropathy causes severe left neural foraminal narrowing at C3-C4 and moderate neural foraminal narrowing on the right at C3-C4 and on the left at C4-C5. Upper chest: Patchy opacities in the peripheral left upper lobe (series 3, image 97. Other: None. IMPRESSION: 1.  No acute intracranial process. 2.  No acute fracture or traumatic listhesis in the cervical spine. 3. Patchy opacities in the peripheral left upper lobe, which are nonspecific but could be infectious or inflammatory. Consider CT chest for further evaluation if clinically indicated. Electronically Signed   By: Merilyn Baba M.D.   On: 11/29/2021 19:13   CT Cervical Spine Wo Contrast  Result Date: 11/29/2021 CLINICAL DATA:  Fall EXAM: CT HEAD WITHOUT CONTRAST CT CERVICAL SPINE WITHOUT CONTRAST TECHNIQUE: Multidetector CT imaging of the head and cervical spine was performed following the standard protocol without intravenous contrast. Multiplanar CT image reconstructions of the cervical spine were also generated. RADIATION DOSE REDUCTION: This exam was performed according to the departmental  dose-optimization program which includes automated exposure control, adjustment of the mA and/or kV according to patient size and/or use of iterative reconstruction technique. COMPARISON:  CT head 05/16/2020, no prior cervical spine CT. FINDINGS: CT HEAD FINDINGS Brain: No evidence of acute infarction, hemorrhage, cerebral edema, mass, mass effect, or midline shift. No hydrocephalus or extra-axial fluid collection. Vascular: No hyperdense vessel. Skull: Normal. Negative for fracture or focal lesion. Sinuses/Orbits: Sequela of chronic right maxillary sinusitis and prior right maxillary antrostomy and right ethmoidectomy, with partial opacification of the right ethmoid air cells. The remaining paranasal sinuses are clear. The orbits are unremarkable. Other: The mastoid air cells are well aerated. CT CERVICAL SPINE FINDINGS Alignment: No listhesis. Skull base and vertebrae: No acute fracture. No primary bone lesion or focal pathologic process. Soft tissues and spinal canal: No prevertebral fluid or swelling. No visible canal hematoma. Disc levels: No high-grade spinal canal stenosis. Uncovertebral and facet arthropathy causes severe left neural foraminal narrowing at C3-C4 and moderate neural foraminal narrowing on the right at C3-C4 and on the left at C4-C5. Upper chest: Patchy opacities in the peripheral left upper lobe (series 3, image 97.  Other: None. IMPRESSION: 1.  No acute intracranial process. 2.  No acute fracture or traumatic listhesis in the cervical spine. 3. Patchy opacities in the peripheral left upper lobe, which are nonspecific but could be infectious or inflammatory. Consider CT chest for further evaluation if clinically indicated. Electronically Signed   By: Merilyn Baba M.D.   On: 11/29/2021 19:13   US Renal  Result Date: 11/29/2021 CLINICAL DATA:  Elevated creatinine EXAM: RENAL / URINARY TRACT ULTRASOUND COMPLETE COMPARISON:  06/23/2018 FINDINGS: Right Kidney: Renal measurements: 12.4 x 7.2 x  7.3 cm. = volume: 340 mL. Mild increased echogenicity is noted. No mass or hydronephrosis is noted. Left Kidney: Renal measurements: 11.5 x 5.1 x 5.7 cm. = volume: 174 mL. Mild increased echogenicity is noted. No mass or hydronephrosis is noted. Bladder: Appears normal for degree of bladder distention. Other: None. IMPRESSION: Mild increased echogenicity consistent with medical renal disease. No other focal abnormality is noted. Electronically Signed   By: Inez Catalina M.D.   On: 11/29/2021 21:21      IMPRESSION AND PLAN:  Assessment and Plan: * AKI (acute kidney injury) ([place]) - The patient will be admitted to a medical telemetry bed. - The patient is significantly dry and therefore it is likely prerenal due to volume depletion and dehydration.  It could be additionally related to rhabdomyolysis. - We will continue hydration with IV normal saline. - We will follow BMPs. - We will follow bilateral renal ultrasound. - We will obtain a nephrology consultation. - Dr. Holley Raring was notified and is aware about the patient.  Rhabdomyolysis - The patient will be hydrated with IV normal saline. - We will follow serial muscle enzymes without renal function.  Acute metabolic encephalopathy - This is clearly secondary to azotemia with marked acute kidney injury. - We will follow mental status with improvement of renal functions.  Anxiety - We will continue Xanax.  Dyslipidemia - We will continue statin therapy.  Essential hypertension - We will continue Norvasc.   DVT prophylaxis: Subcutaneous heparin. Advanced Care Planning:  Code Status: full code.  Family Communication:  The plan of care was discussed in details with the patient (and family). I answered all questions. The patient agreed to proceed with the above mentioned plan. Further management will depend upon hospital course. Disposition Plan: Back to previous home environment Consults called: Nephrology. All the records are reviewed  and case discussed with ED provider.  Status is: Inpatient  At the time of the admission, it appears that the appropriate admission status for this patient is inpatient.  This is judged to be reasonable and necessary in order to provide the required intensity of service to ensure the patient's safety given the presenting symptoms, physical exam findings and initial radiographic and laboratory data in the context of comorbid conditions.  The patient requires inpatient status due to high intensity of service, high risk of further deterioration and high frequency of surveillance required.  I certify that at the time of admission, it is my clinical judgment that the patient will require inpatient hospital care extending more than 2 midnights.                            Dispo: The patient is from: Home              Anticipated d/c is to: Home              Patient currently is not medically  stable to d/c.              Difficult to place patient: No  Christel Mormon M.D on 11/29/2021 at 11:41 PM  [facility]   From 7 PM-7 AM, contact night-coverage [email or web address]  CC: Primary care physician; Albina Billet, MD

## 2021-11-29 NOTE — Assessment & Plan Note (Addendum)
Have changed Xanax from scheduled to as needed and decrease dose.  Monitoring for withdrawal.

## 2021-11-29 NOTE — ED Notes (Signed)
Red top also collected and sent to lab

## 2021-11-29 NOTE — ED Provider Triage Note (Signed)
Emergency Medicine Provider Triage Evaluation Note  Carla Gomez, a 66 y.o. female  was evaluated in triage.  Pt complains of AMS.  Patient herself is a poor historian, upon arrival, patient is somnolent with eyes closed, but will respond to verbal stimuli.  Patient is slow to answer questions, easily losing track of her thought.  She presents to the ED after her neighbors heard her calling out following a mechanical fall.  Is unwitnessed fall, unclear how long the patient may have been down.  Patient does endorse that she is under chronic pain management by her primary provider, and reports she felt like she was going into withdrawal yesterday.  She denies any complaints of pain or injury at this time.  Review of Systems  Positive: altered Negative: CP, SOB  Physical Exam  BP (!) 121/59   Pulse 94   Temp 98.1 F (36.7 C) (Oral)   Resp 20   Ht '5\' 4"'$  (1.626 m)   Wt 79.4 kg   SpO2 99%   BMI 30.04 kg/m  Gen:   Awake, no distress  alert to person, year Resp:  Normal effort CTA MSK:   Moves extremities without difficulty  Other:  RRR  Medical Decision Making  Medically screening exam initiated at 6:38 PM.  Appropriate orders placed.  Carla Gomez was informed that the remainder of the evaluation will be completed by another provider, this initial triage assessment does not replace that evaluation, and the importance of remaining in the ED until their evaluation is complete.  Patient to the ED via EMS from home, with reports of altered mental status, after being found down by her neighbors following an unwitnessed fall.   Carla Needles, PA-C 11/29/21 1841

## 2021-11-29 NOTE — ED Triage Notes (Signed)
First nurse note: Patient arrived by EMS from home. Neighbors heard patient hollering and called EMS. Patient had fallen and was helped up and in chair when EMS arrived. Patient believes she is going through withdrawals and has not slept in 3 days. Reports she does not feel safe enough to sleep at home. EMS vitals 127/53 b/p, 98HR, 98% RA, cbg 127

## 2021-11-29 NOTE — Assessment & Plan Note (Addendum)
Tachycardic and hypertensive.  We will switch Norvasc to metoprolol 25 mg p.o. twice daily

## 2021-11-29 NOTE — Assessment & Plan Note (Addendum)
Secondary to uremia brought on by renal failure.  Patient lives alone and we do not have a baseline or a timeframe for when her mentation began to change.  Some improvement on 5/21, but by 5/22 is more somnolent and confused

## 2021-11-30 ENCOUNTER — Inpatient Hospital Stay: Payer: Medicare Other

## 2021-11-30 DIAGNOSIS — N179 Acute kidney failure, unspecified: Secondary | ICD-10-CM | POA: Diagnosis not present

## 2021-11-30 DIAGNOSIS — R0989 Other specified symptoms and signs involving the circulatory and respiratory systems: Secondary | ICD-10-CM | POA: Diagnosis present

## 2021-11-30 DIAGNOSIS — E663 Overweight: Secondary | ICD-10-CM | POA: Diagnosis present

## 2021-11-30 DIAGNOSIS — G9341 Metabolic encephalopathy: Secondary | ICD-10-CM | POA: Diagnosis not present

## 2021-11-30 LAB — HEPATITIS PANEL, ACUTE
HCV Ab: NONREACTIVE
Hep A IgM: NONREACTIVE
Hep B C IgM: NONREACTIVE
Hepatitis B Surface Ag: NONREACTIVE

## 2021-11-30 LAB — RENAL FUNCTION PANEL
Albumin: 3.4 g/dL — ABNORMAL LOW (ref 3.5–5.0)
Anion gap: 20 — ABNORMAL HIGH (ref 5–15)
BUN: 125 mg/dL — ABNORMAL HIGH (ref 8–23)
CO2: 15 mmol/L — ABNORMAL LOW (ref 22–32)
Calcium: 8.2 mg/dL — ABNORMAL LOW (ref 8.9–10.3)
Chloride: 88 mmol/L — ABNORMAL LOW (ref 98–111)
Creatinine, Ser: 15.49 mg/dL — ABNORMAL HIGH (ref 0.44–1.00)
GFR, Estimated: 2 mL/min — ABNORMAL LOW (ref >60)
Glucose, Bld: 99 mg/dL (ref 70–99)
Phosphorus: 10.8 mg/dL — ABNORMAL HIGH (ref 2.5–4.6)
Potassium: 5.1 mmol/L (ref 3.5–5.1)
Sodium: 123 mmol/L — ABNORMAL LOW (ref 135–145)

## 2021-11-30 LAB — CBC
HCT: 25.4 % — ABNORMAL LOW (ref 36.0–46.0)
HCT: 25.8 % — ABNORMAL LOW (ref 36.0–46.0)
Hemoglobin: 9 g/dL — ABNORMAL LOW (ref 12.0–15.0)
Hemoglobin: 9 g/dL — ABNORMAL LOW (ref 12.0–15.0)
MCH: 29.2 pg (ref 26.0–34.0)
MCH: 28.8 pg (ref 26.0–34.0)
MCHC: 35.4 g/dL (ref 30.0–36.0)
MCHC: 34.9 g/dL (ref 30.0–36.0)
MCV: 82.5 fL (ref 80.0–100.0)
MCV: 82.7 fL (ref 80.0–100.0)
Platelets: 143 10*3/uL — ABNORMAL LOW (ref 150–400)
Platelets: 134 10*3/uL — ABNORMAL LOW (ref 150–400)
RBC: 3.08 MIL/uL — ABNORMAL LOW (ref 3.87–5.11)
RBC: 3.12 MIL/uL — ABNORMAL LOW (ref 3.87–5.11)
RDW: 12.7 % (ref 11.5–15.5)
RDW: 13 % (ref 11.5–15.5)
WBC: 8.3 10*3/uL (ref 4.0–10.5)
WBC: 9.7 10*3/uL (ref 4.0–10.5)
nRBC: 0 % (ref 0.0–0.2)
nRBC: 0 % (ref 0.0–0.2)

## 2021-11-30 LAB — COMPREHENSIVE METABOLIC PANEL
ALT: 23 U/L (ref 0–44)
AST: 16 U/L (ref 15–41)
Albumin: 3.3 g/dL — ABNORMAL LOW (ref 3.5–5.0)
Alkaline Phosphatase: 84 U/L (ref 38–126)
Anion gap: 21 — ABNORMAL HIGH (ref 5–15)
BUN: 130 mg/dL — ABNORMAL HIGH (ref 8–23)
CO2: 15 mmol/L — ABNORMAL LOW (ref 22–32)
Calcium: 8 mg/dL — ABNORMAL LOW (ref 8.9–10.3)
Chloride: 85 mmol/L — ABNORMAL LOW (ref 98–111)
Creatinine, Ser: 15.21 mg/dL — ABNORMAL HIGH (ref 0.44–1.00)
GFR, Estimated: 2 mL/min — ABNORMAL LOW (ref >60)
Glucose, Bld: 111 mg/dL — ABNORMAL HIGH (ref 70–99)
Potassium: 4.8 mmol/L (ref 3.5–5.1)
Sodium: 121 mmol/L — ABNORMAL LOW (ref 135–145)
Total Bilirubin: 1.1 mg/dL (ref 0.3–1.2)
Total Protein: 6.9 g/dL (ref 6.5–8.1)

## 2021-11-30 LAB — BASIC METABOLIC PANEL
Anion gap: 22 — ABNORMAL HIGH (ref 5–15)
BUN: 119 mg/dL — ABNORMAL HIGH (ref 8–23)
CO2: 15 mmol/L — ABNORMAL LOW (ref 22–32)
Calcium: 8.4 mg/dL — ABNORMAL LOW (ref 8.9–10.3)
Chloride: 86 mmol/L — ABNORMAL LOW (ref 98–111)
Creatinine, Ser: 15.79 mg/dL — ABNORMAL HIGH (ref 0.44–1.00)
GFR, Estimated: 2 mL/min — ABNORMAL LOW (ref >60)
Glucose, Bld: 105 mg/dL — ABNORMAL HIGH (ref 70–99)
Potassium: 5 mmol/L (ref 3.5–5.1)
Sodium: 123 mmol/L — ABNORMAL LOW (ref 135–145)

## 2021-11-30 LAB — CK
Total CK: 901 U/L — ABNORMAL HIGH (ref 38–234)
Total CK: 732 U/L — ABNORMAL HIGH (ref 38–234)

## 2021-11-30 LAB — MRSA NEXT GEN BY PCR, NASAL: MRSA by PCR Next Gen: NOT DETECTED

## 2021-11-30 LAB — SEDIMENTATION RATE: Sed Rate: 106 mm/hr — ABNORMAL HIGH (ref 0–30)

## 2021-11-30 LAB — HEPATITIS B SURFACE ANTIBODY,QUALITATIVE: Hep B S Ab: NONREACTIVE

## 2021-11-30 LAB — HEMOGLOBIN A1C
Hgb A1c MFr Bld: 5.7 % — ABNORMAL HIGH (ref 4.8–5.6)
Mean Plasma Glucose: 116.89 mg/dL

## 2021-11-30 LAB — HIV ANTIBODY (ROUTINE TESTING W REFLEX): HIV Screen 4th Generation wRfx: NONREACTIVE

## 2021-11-30 LAB — PHOSPHORUS: Phosphorus: 11 mg/dL — ABNORMAL HIGH (ref 2.5–4.6)

## 2021-11-30 LAB — TSH: TSH: 2.12 u[IU]/mL (ref 0.350–4.500)

## 2021-11-30 MED ORDER — ALTEPLASE 2 MG IJ SOLR: 2.0000 mg | Freq: Once | INTRAMUSCULAR | Status: DC | PRN

## 2021-11-30 MED ORDER — CHLORHEXIDINE GLUCONATE CLOTH 2 % EX PADS
6.0000 | MEDICATED_PAD | Freq: Every day | CUTANEOUS | Status: DC
  Administered 2021-12-01: 6 via TOPICAL

## 2021-11-30 MED ORDER — PENTAFLUOROPROP-TETRAFLUOROETH EX AERO: 1.0000 "application " | INHALATION_SPRAY | CUTANEOUS | Status: DC | PRN

## 2021-11-30 MED ORDER — SODIUM CHLORIDE 0.9 % IV SOLN: 100.0000 mL | INTRAVENOUS | Status: DC | PRN

## 2021-11-30 MED ORDER — MIDAZOLAM HCL 2 MG/2ML IJ SOLN: 1.0000 mg | Freq: Once | INTRAMUSCULAR | Status: DC

## 2021-11-30 MED ORDER — SODIUM CHLORIDE 0.9 % IV SOLN
1.0000 g | INTRAVENOUS | Status: DC
Start: 2021-11-30 — End: 2021-12-03
  Administered 2021-11-30 – 2021-12-02 (×3): 1 g via INTRAVENOUS
  Filled 2021-11-30: qty 1
  Filled 2021-11-30: qty 10
  Filled 2021-11-30 (×2): qty 1

## 2021-11-30 MED ORDER — LIDOCAINE HCL (PF) 1 % IJ SOLN: 5.0000 mL | INTRAMUSCULAR | Status: DC | PRN

## 2021-11-30 MED ORDER — MIDAZOLAM HCL 2 MG/2ML IJ SOLN
INTRAMUSCULAR | Status: AC
  Administered 2021-11-30: 2 mg
  Filled 2021-11-30: qty 2

## 2021-11-30 MED ORDER — FENTANYL CITRATE PF 50 MCG/ML IJ SOSY: 25.0000 ug | PREFILLED_SYRINGE | Freq: Once | INTRAMUSCULAR | Status: DC

## 2021-11-30 MED ORDER — CHLORHEXIDINE GLUCONATE CLOTH 2 % EX PADS
6.0000 | MEDICATED_PAD | Freq: Every day | CUTANEOUS | Status: DC
  Administered 2021-12-01 – 2021-12-05 (×5): 6 via TOPICAL

## 2021-11-30 MED ORDER — HEPARIN SODIUM (PORCINE) 1000 UNIT/ML IJ SOLN
INTRAMUSCULAR | Status: AC
Start: 2021-11-30 — End: 2021-12-01
  Filled 2021-11-30: qty 10

## 2021-11-30 MED ORDER — FENTANYL CITRATE PF 50 MCG/ML IJ SOSY
PREFILLED_SYRINGE | INTRAMUSCULAR | Status: AC
  Administered 2021-11-30: 50 ug
  Filled 2021-11-30: qty 1

## 2021-11-30 MED ORDER — LIDOCAINE-PRILOCAINE 2.5-2.5 % EX CREA: 1.0000 "application " | TOPICAL_CREAM | CUTANEOUS | Status: DC | PRN

## 2021-11-30 MED ORDER — HEPARIN SODIUM (PORCINE) 1000 UNIT/ML DIALYSIS: 1000.0000 [IU] | INTRAMUSCULAR | Status: DC | PRN

## 2021-11-30 NOTE — Consult Note (Signed)
[facility] CONSULT NOTE    Date: 11/30/2021                  Patient Name:  Carla Gomez  MRN: [id number]  DOB: Aug 31, 1955  Age / Sex: 66 y.o., female         PCP: Albina Billet, MD                 Service Requesting Consult: Medicine                  Reason for Consult: Acute kidney injury             History of Present Illness: Patient is a 66 y.o. female with a PMHx of hypertension, hyperlipidemia, fibromyalgia, chronic pain syndrome and migraine headaches now admitted via the emergency room yesterday with history of mental status changes.  She was found to have a creatinine of 16.2 with a BUN of 123.  She also has a sodium of 123 yesterday.  Her CPK level on admission was 1375.  She was started on IV fluid resuscitation with minimal improvement of urine output.  She denies any prior history of nonsteroidal anti-inflammatory drug use.  She is not aware of any history of renal failure.  She had a renal sonogram done in the emergency room which was negative. Patient's chart was completely reviewed.  There is no evidence of any blood work in the last 7 years. Patient's history goes to [facility] but unable to find any notes or labs.  She has been on opiate medications as outpatient.  Medications: Outpatient medications: Medications Prior to Admission  Medication Sig Dispense Refill Last Dose   amLODipine (NORVASC) 5 MG tablet Take 1 tablet by mouth every morning.   Past Week   Cholecalciferol (VITAMIN D3) 50 MCG (2000 UT) TABS Take 1 tablet by mouth daily.   Past Week   cyclobenzaprine (FLEXERIL) 10 MG tablet Take 1 tablet by mouth 3 (three) times daily.   Past Week   HYDROcodone-acetaminophen (NORCO) 10-325 MG tablet Take 1-2 tablets by mouth every 6 (six) hours.   Past Week   morphine (MS CONTIN) 100 MG 12 hr tablet Take 100 mg by mouth daily.   Past Week   Multiple Vitamin (MULTIVITAMIN) tablet Take 1 tablet by mouth daily.   Past Week    pravastatin (PRAVACHOL) 40 MG tablet SMARTSIG:1 Tablet(s) By Mouth Every Evening   Past Week   rosuvastatin (CRESTOR) 20 MG tablet SMARTSIG:1 Tablet(s) By Mouth Every Evening   Past Week   SUMAtriptan (IMITREX) 100 MG tablet Take 100 mg by mouth daily as needed.   prn at prn   ALPRAZolam (XANAX) 1 MG tablet Take 1 mg by mouth 4 (four) times daily.       Discontinued Meds:   Medications Discontinued During This Encounter  Medication Reason   morphine (KADIAN) 100 MG 24 hr capsule Dose change    Current medications: Current Facility-Administered Medications  Medication Dose Route Frequency Provider Last Rate Last Admin   0.9 %  sodium chloride infusion   Intravenous Continuous Mansy, Jan A, MD 125 mL/hr at 11/30/21 1000 Infusion Verify at 11/30/21 1000   acetaminophen (TYLENOL) tablet 650 mg  650 mg Oral Q6H PRN Mansy, Jan A, MD       Or   acetaminophen (TYLENOL) suppository 650 mg  650 mg Rectal Q6H PRN Mansy, Arvella Merles, MD       ALPRAZolam Duanne Moron) tablet 1 mg  1 mg Oral QID Mansy, Jan A, MD   1 mg at 11/30/21 1610   amLODipine (NORVASC) tablet 5 mg  5 mg Oral q morning Mansy, Jan A, MD   5 mg at 11/30/21 9604   cholecalciferol (VITAMIN D3) tablet   Oral Daily Mansy, Jan A, MD   1,000 Units at 11/30/21 5409   cyclobenzaprine (FLEXERIL) tablet 10 mg  10 mg Oral TID Mansy, Jan A, MD   10 mg at 11/30/21 8119   heparin injection 5,000 Units  5,000 Units Subcutaneous Q8H Mansy, Jan A, MD   5,000 Units at 11/30/21 0551   HYDROcodone-acetaminophen (NORCO) 10-325 MG per tablet 1-2 tablet  1-2 tablet Oral Q6H Mansy, Jan A, MD   1 tablet at 11/30/21 0551   magnesium hydroxide (MILK OF MAGNESIA) suspension 30 mL  30 mL Oral Daily PRN Mansy, Jan A, MD       multivitamin with minerals tablet 1 tablet  1 tablet Oral Daily Mansy, Jan A, MD   1 tablet at 11/30/21 0937   ondansetron (ZOFRAN) tablet 4 mg  4 mg Oral Q6H PRN Mansy, Jan A, MD       Or   ondansetron [facility]) injection 4 mg  4 mg Intravenous Q6H  PRN Mansy, Jan A, MD       pravastatin (PRAVACHOL) tablet 40 mg  40 mg Oral q1800 Mansy, Jan A, MD       SUMAtriptan (IMITREX) tablet 100 mg  100 mg Oral Daily PRN Mansy, Jan A, MD       traZODone (DESYREL) tablet 25 mg  25 mg Oral QHS PRN Mansy, Arvella Merles, MD          Allergies: Allergies  Allergen Reactions   Citrus     Triggers migraines   Grass Pollen(K-O-R-T-Swt Vern)    Latex     Redness around mouth after dental procedures      Past Medical History: Past Medical History:  Diagnosis Date   Cancer ([place])    basal cell   Chronic fatigue    Chronic pain    Dental bridge present    permanent bottom right   Fibromyalgia    Hypertension    Migraine headache      Past Surgical History: Past Surgical History:  Procedure Laterality Date   ABDOMINAL HYSTERECTOMY     BREAST BIOPSY Left 09/26/14   lt bx/clip- benign   BREAST BIOPSY Left 11/02/14   benign   COLONOSCOPY WITH PROPOFOL N/A 12/21/2020   Procedure: COLONOSCOPY WITH PROPOFOL;  Surgeon: Lucilla Lame, MD;  Location: [place];  Service: Endoscopy;  Laterality: N/A;   NASAL SINUS SURGERY       Family History: Family History  Problem Relation Age of Onset   Breast cancer Maternal Aunt        mat great aunt     Social History: Social History   Socioeconomic History   Marital status: Divorced    Spouse name: Not on file   Number of children: Not on file   Years of education: Not on file   Highest education level: Not on file  Occupational History   Not on file  Tobacco Use   Smoking status: Never   Smokeless tobacco: Never  Vaping Use   Vaping Use: Never used  Substance and Sexual Activity   Alcohol use: Never   Drug use: Never   Sexual activity: Not Currently  Other Topics Concern   Not on file  Social History  Narrative   Not on file   Social Determinants of Health   Financial Resource Strain: Not on file  Food Insecurity: Not on file  Transportation Needs: Not on file  Physical  Activity: Not on file  Stress: Not on file  Social Connections: Not on file  Intimate Partner Violence: Not on file     Review of Systems: As per HPI  Vital Signs: Blood pressure 121/69, pulse 98, temperature 98 F (36.7 C), temperature source Oral, resp. rate 17, height '5\' 4"'$  (1.626 m), weight 74.6 kg, SpO2 93 %.  Weight trends: Filed Weights   11/29/21 1834 11/29/21 2155  Weight: 79.4 kg 74.6 kg    Physical Exam: Physical Exam: General:  No acute distress  Head:  Normocephalic, atraumatic. Moist oral mucosal membranes  Eyes:  Anicteric  Neck:  Supple  Lungs:   Clear to auscultation, normal effort  Heart:  S1S2 no rubs  Abdomen:   Soft, nontender, bowel sounds present  Extremities:  peripheral edema.  Neurologic: Lethargic but arousable and following commands  Skin:  No lesions  Access:     Lab results:  Basic Metabolic Panel: Recent Labs  Lab 11/29/21 1832 11/30/21 0423  NA 123* 123*  K 5.1 5.0  CL 81* 86*  CO2 15* 15*  GLUCOSE 130* 105*  BUN 123* 119*  CREATININE 16.28* 15.79*  CALCIUM 8.8* 8.4*    Creatinine  Date/Time Value Ref Range Status  10/26/2014 10:11 AM 0.72 mg/dL Final    Comment:    0.44-1.00 NOTE: New Reference Range  09/19/14    Creatinine, Ser  Date/Time Value Ref Range Status  11/30/2021 04:23 AM 15.79 (H) 0.44 - 1.00 mg/dL Final  11/29/2021 06:32 PM 16.28 (H) 0.44 - 1.00 mg/dL Final    CBC: Recent Labs  Lab 11/29/21 1832 11/30/21 0423  WBC 8.9 8.3  HGB 9.9* 9.0*  HCT 28.4* 25.4*  MCV 82.6 82.5  PLT 155 143*    Microbiology: No results found for this or any previous visit.  Urinalysis: Recent Labs    11/29/21 2021  COLORURINE YELLOW*  LABSPEC 1.011  PHURINE 5.0  GLUCOSEU NEGATIVE  HGBUR LARGE*  BILIRUBINUR NEGATIVE  KETONESUR NEGATIVE  PROTEINUR 100*  NITRITE NEGATIVE  LEUKOCYTESUR NEGATIVE     Imaging:  CT HEAD WO CONTRAST (5MM)  Result Date: 11/29/2021 CLINICAL DATA:  Fall EXAM: CT HEAD WITHOUT  CONTRAST CT CERVICAL SPINE WITHOUT CONTRAST TECHNIQUE: Multidetector CT imaging of the head and cervical spine was performed following the standard protocol without intravenous contrast. Multiplanar CT image reconstructions of the cervical spine were also generated. RADIATION DOSE REDUCTION: This exam was performed according to the departmental dose-optimization program which includes automated exposure control, adjustment of the mA and/or kV according to patient size and/or use of iterative reconstruction technique. COMPARISON:  CT head 05/16/2020, no prior cervical spine CT. FINDINGS: CT HEAD FINDINGS Brain: No evidence of acute infarction, hemorrhage, cerebral edema, mass, mass effect, or midline shift. No hydrocephalus or extra-axial fluid collection. Vascular: No hyperdense vessel. Skull: Normal. Negative for fracture or focal lesion. Sinuses/Orbits: Sequela of chronic right maxillary sinusitis and prior right maxillary antrostomy and right ethmoidectomy, with partial opacification of the right ethmoid air cells. The remaining paranasal sinuses are clear. The orbits are unremarkable. Other: The mastoid air cells are well aerated. CT CERVICAL SPINE FINDINGS Alignment: No listhesis. Skull base and vertebrae: No acute fracture. No primary bone lesion or focal pathologic process. Soft tissues and spinal canal: No prevertebral fluid or swelling.  No visible canal hematoma. Disc levels: No high-grade spinal canal stenosis. Uncovertebral and facet arthropathy causes severe left neural foraminal narrowing at C3-C4 and moderate neural foraminal narrowing on the right at C3-C4 and on the left at C4-C5. Upper chest: Patchy opacities in the peripheral left upper lobe (series 3, image 97. Other: None. IMPRESSION: 1.  No acute intracranial process. 2.  No acute fracture or traumatic listhesis in the cervical spine. 3. Patchy opacities in the peripheral left upper lobe, which are nonspecific but could be infectious or  inflammatory. Consider CT chest for further evaluation if clinically indicated. Electronically Signed   By: Merilyn Baba M.D.   On: 11/29/2021 19:13   CT Cervical Spine Wo Contrast  Result Date: 11/29/2021 CLINICAL DATA:  Fall EXAM: CT HEAD WITHOUT CONTRAST CT CERVICAL SPINE WITHOUT CONTRAST TECHNIQUE: Multidetector CT imaging of the head and cervical spine was performed following the standard protocol without intravenous contrast. Multiplanar CT image reconstructions of the cervical spine were also generated. RADIATION DOSE REDUCTION: This exam was performed according to the departmental dose-optimization program which includes automated exposure control, adjustment of the mA and/or kV according to patient size and/or use of iterative reconstruction technique. COMPARISON:  CT head 05/16/2020, no prior cervical spine CT. FINDINGS: CT HEAD FINDINGS Brain: No evidence of acute infarction, hemorrhage, cerebral edema, mass, mass effect, or midline shift. No hydrocephalus or extra-axial fluid collection. Vascular: No hyperdense vessel. Skull: Normal. Negative for fracture or focal lesion. Sinuses/Orbits: Sequela of chronic right maxillary sinusitis and prior right maxillary antrostomy and right ethmoidectomy, with partial opacification of the right ethmoid air cells. The remaining paranasal sinuses are clear. The orbits are unremarkable. Other: The mastoid air cells are well aerated. CT CERVICAL SPINE FINDINGS Alignment: No listhesis. Skull base and vertebrae: No acute fracture. No primary bone lesion or focal pathologic process. Soft tissues and spinal canal: No prevertebral fluid or swelling. No visible canal hematoma. Disc levels: No high-grade spinal canal stenosis. Uncovertebral and facet arthropathy causes severe left neural foraminal narrowing at C3-C4 and moderate neural foraminal narrowing on the right at C3-C4 and on the left at C4-C5. Upper chest: Patchy opacities in the peripheral left upper lobe (series  3, image 97. Other: None. IMPRESSION: 1.  No acute intracranial process. 2.  No acute fracture or traumatic listhesis in the cervical spine. 3. Patchy opacities in the peripheral left upper lobe, which are nonspecific but could be infectious or inflammatory. Consider CT chest for further evaluation if clinically indicated. Electronically Signed   By: Merilyn Baba M.D.   On: 11/29/2021 19:13   US Renal  Result Date: 11/29/2021 CLINICAL DATA:  Elevated creatinine EXAM: RENAL / URINARY TRACT ULTRASOUND COMPLETE COMPARISON:  06/23/2018 FINDINGS: Right Kidney: Renal measurements: 12.4 x 7.2 x 7.3 cm. = volume: 340 mL. Mild increased echogenicity is noted. No mass or hydronephrosis is noted. Left Kidney: Renal measurements: 11.5 x 5.1 x 5.7 cm. = volume: 174 mL. Mild increased echogenicity is noted. No mass or hydronephrosis is noted. Bladder: Appears normal for degree of bladder distention. Other: None. IMPRESSION: Mild increased echogenicity consistent with medical renal disease. No other focal abnormality is noted. Electronically Signed   By: Inez Catalina M.D.   On: 11/29/2021 21:21   DG Abd 2 Views  Result Date: 11/30/2021 CLINICAL DATA:  66 year old female with abdominal pain. EXAM: ABDOMEN - 2 VIEW COMPARISON:  None Available. FINDINGS: Stool and gas in the colon and rectum noted. Nondistended gas-filled loops of small bowel are present.  No dilated bowel loops are noted to suggest bowel obstruction. There is no evidence of pneumoperitoneum. No suspicious calcifications are identified. IMPRESSION: 1. Nonspecific nonobstructive bowel gas pattern. No evidence of pneumoperitoneum. 2. No evidence of suspicious calcifications. Electronically Signed   By: Margarette Canada M.D.   On: 11/30/2021 10:48     Assessment & Plan:  66 y.o. female with a PMHx of hypertension, hyperlipidemia, fibromyalgia, chronic pain syndrome and migraine headaches now admitted via the emergency room yesterday with history of mental status  changes.  She was found to have a creatinine of 16.2 with a BUN of 123.  She also has a sodium of 123 yesterday.  Her CPK level on admission was 1375.  She was started on IV fluid resuscitation with minimal improvement of urine output.  She denies any prior history of nonsteroidal anti-inflammatory drug use.  She is not aware of any history of renal failure.  She had a renal sonogram done in the emergency room which was negative.  Principal Problem:   AKI (acute kidney injury) ([place]) Active Problems:   Rhabdomyolysis   Essential hypertension   Dyslipidemia   Anxiety   Acute metabolic encephalopathy   Overweight (BMI 25.0-29.9)   Abnormal finding of lung  #1: Acute kidney injury: Patient has acute kidney injury with metabolic acidosis and hyponatremia.  The acute kidney injury may be speculated secondary to severe volume depletion due to decreased p.o. intake complicated by fall leading to rhabdomyolysis.  At the present time I would like to continue the isotonic saline at 125 cc an hour.  We will continue to monitor renal profile closely.  If the urine output does not improve or the mental status does not improve we may have to start her on dialysis.  Serological tests were ordered.  #2: Hyponatremia: Hyponatremia most likely secondary to decreased p.o. intake.  We will continue the isotonic saline hydration.  #3: Metabolic acidosis: Metabolic acidosis may be secondary to renal failure complicated by possible infection.  No acute need for bicarbonate infusion  #4: Rhabdomyolysis: We will continue IV fluid resuscitation and check CPK and myoglobin levels.  #5: Hypertension: Continue amlodipine as ordered.  #6: Sepsis/infection: We will start her on Rocephin for presumed urinary infection.  I will continue to follow the patient closely during the hospitalization and give recommendations as required.  Case discussed with PMD.  LOS: 1 Lyla Son, MD [facility]. 5/20/202312:58 PM

## 2021-11-30 NOTE — Assessment & Plan Note (Addendum)
CT scan of C-spine noted incidental finding of opacity in left upper lobe.  Could be infection versus inflammatory.  CT scan of chest to follow when patient is more lucid and can lie still.  Incidentally, chest x-ray done on 5/21 (because patient had been pulling at her dialysis catheter line) noted significant improvement in infiltrate.

## 2021-11-30 NOTE — Progress Notes (Signed)
Officer Coralyn Mark from [employer] called the ED and advised that if the friend of the Patient Carla Gomez) who lives in the same apartment complex cannot pick her up upon discharge, she would transport the Patient back to the Patient's residence. Officer Coralyn Mark can be reached at [phone].

## 2021-11-30 NOTE — Procedures (Signed)
Hemodialysis Catheter Insertion Procedure Note Carla Gomez [id number] 1956/02/20  Procedure: Insertion of Hemodialysis Catheter Indications: Hemodialysis  Procedure Details Consent: Risks of procedure as well as the alternatives and risks of each were explained to the (patient/caregiver).  Consent for procedure obtained.  Time Out: Verified patient identification, verified procedure, site/side was marked, verified correct patient position, special equipment/implants available, medications/allergies/relevent history reviewed, required imaging and test results available.  Performed  Maximum sterile technique was used including antiseptics, cap, gloves, gown, hand hygiene, mask, and sheet.  Skin prep: Chlorhexidine; local anesthetic administered  A Trialysis HD catheter was placed in the left internal jugular vein using the Seldinger technique.  Evaluation Blood flow good Complications: No apparent complications Patient did tolerate procedure well. Chest X-ray ordered to verify placement.  CXR: pending.   Procedure performed under direct supervision of Dr. Patsey Berthold and with ultrasound guidance for real time vessel cannulation.       Line secured at the 19 cm mark. BIOPATCH applied to the insertion site.  Carla Gomez, AGACNP-BC [facility] Prefer epic messenger for cross cover needs If after hours, please call E-link  11/30/2021, 5:24 PM

## 2021-11-30 NOTE — Assessment & Plan Note (Addendum)
Meets criteria BMI greater than 25 

## 2021-11-30 NOTE — Progress Notes (Signed)
[facility] Progress Note  Patient: Carla Gomez    ZHG:[id number]  DOA: 11/29/2021    Date of Service: the patient was seen and examined on 11/30/2021  Brief hospital course: 66 year old female with past medical history of fibromyalgia, hypertension and chronic pain who presented to the emergency room on 5/19 with altered mental status.  Patient was brought in by EMS after her neighbors noted she was shouting and confused.  In the emergency room, patient found to have urinary tract infection, sodium of 123 and creatinine of 16.28 with a BUN of 123.  Last documented labs on this patient for from 2017 noting normal renal function at that time.  CK level at 1375.  Renal ultrasound done noted some chronic renal disease, but no atrophy or hydronephrosis.  Patient was admitted to the hospital service and started on IV fluids.  Nephrology was consulted.  Assessment and Plan: Assessment and Plan: * AKI (acute kidney injury) ([place]) Unclear etiology.  Possibly prerenal plus mild rhabdo?  We do not have a baseline for this patient.  Renal ultrasound unremarkable.  Neurology consulted.  Has responded to IV fluids with creatinine with minimal improvement although she started to have urine output.  For now, continue IV fluids and monitor output.  Nephrology to determine on if/when dialysis needed  Acute metabolic encephalopathy Secondary to uremia brought on by renal failure.  Patient lives alone and we do not have a baseline or a timeframe for when her mentation began to change.  Rhabdomyolysis Mild.  Continue IV fluids.  Recheck CK in the morning.  Essential hypertension - We will continue Norvasc.  Blood pressure is have been stable, in part due to intravascular volume depletion.  Abnormal finding of lung CT scan of C-spine noted incidental finding of opacity in left upper lobe.  Could be infection versus inflammatory.  CT scan of chest to follow  Anxiety - We will continue Xanax, although  monitoring for oversedation.  Dyslipidemia - We will continue statin therapy.  Overweight (BMI 25.0-29.9) Meets criteria BMI greater than 25       Body mass index is 28.23 kg/m.        Consultants: Nephrology  Procedures: Renal ultrasound: Mild medical disease.  No hydronephrosis.  Antimicrobials: None  Code Status: Full code   Subjective: Patient somewhat groggy.  Complains of itching of her right arm and thigh  Objective: Vital signs were reviewed and unremarkable. Vitals:   11/30/21 0830 11/30/21 1207  BP: 134/72 121/69  Pulse: 98 98  Resp: 17 17  Temp: (!) 97.5 F (36.4 C) 98 F (36.7 C)  SpO2: 98% 93%    Intake/Output Summary (Last 24 hours) at 11/30/2021 1240 Last data filed at 11/30/2021 1000 Gross per 24 hour  Intake 1960.44 ml  Output 55 ml  Net 1905.44 ml   Filed Weights   11/29/21 1834 11/29/21 2155  Weight: 79.4 kg 74.6 kg   Body mass index is 28.23 kg/m.  Exam:  General: Groggy, oriented x1 HEENT: Normocephalic, atraumatic, mucous membranes are dry Cardiovascular: Regular rate and rhythm, S1-S2 Respiratory: Clear to auscultation bilaterally Abdomen: Soft, nontender, nondistended, positive bowel sounds Musculoskeletal: No clubbing or cyanosis or edema Skin: No skin breaks, tears or lesions Psychiatry: Acutely delirious, no evidence of psychoses Neurology: Exam limited to patient's somnolence, but no focal deficits  Data Reviewed: Sodium staying at 123.  Creatinine with minimal improvement down to 15.8 but anion gap down to 22.  CK down to 901.  Hemoglobin at 9,  down from 9.9 of this was from hemoconcentration  Disposition:  Status is: Inpatient Remains inpatient appropriate because: Stabilization of renal function    Anticipated discharge date: 5/24  Family Communication: Left message for son DVT Prophylaxis: heparin injection 5,000 Units Start: 11/29/21 2300    Author: Annita Brod ,MD 11/30/2021 12:40 PM  To  reach On-call, see care teams to locate the attending and reach out via [email or web address]. Between 7PM-7AM, please contact night-coverage If you still have difficulty reaching the attending provider, please page the [facility] (Director on Call) for [facility] on amion for assistance.

## 2021-11-30 NOTE — Hospital Course (Addendum)
66 year old female with past medical history of fibromyalgia, hypertension and chronic pain who presented to the emergency room on 5/19 with altered mental status.  Patient was brought in by EMS after her neighbors noted she was shouting and confused.  In the emergency room, patient found to have urinary tract infection, sodium of 123 and creatinine of 16.28 with a BUN of 123.  Last documented labs on this patient for from 2017 noting normal renal function at that time.  CK level at 1375.  Renal ultrasound done noted some chronic renal disease, but no atrophy or hydronephrosis.  Patient was admitted to the hospital service and started on IV fluids.  Nephrology was consulted.  Although labs on 5/20 morning noted mildly improving creatinine with some mild urine output, follow-up labs that afternoon noted no urine output and worsening creatinine despite IV fluids.  Also developing myoclonus.  Patient transferred to stepdown and dialysis catheter placed and patient underwent hemodialysis.  By 5/21, patient still confused, but a bit more awake and alert.  She has since continued to have good urine output, but by 5/22 more drowsy and BUN rising.  Nephrology plans to repeat hemodialysis 5/22 without filtration.  5/23: Transferred to floor Psychiatry consulted and foley Dc'ed 5/24- creat up 4.8> 5.2, k 3.2 refusing k dur.

## 2021-11-30 NOTE — Progress Notes (Signed)
Spoke to patient's son and obtained consent. Dialyzed patient and she tolerated treatment well Will reassess in AM for further dialysis. Case discussed with ICU team.

## 2021-12-01 ENCOUNTER — Inpatient Hospital Stay: Payer: Medicare Other

## 2021-12-01 DIAGNOSIS — G8929 Other chronic pain: Secondary | ICD-10-CM | POA: Diagnosis present

## 2021-12-01 DIAGNOSIS — N179 Acute kidney failure, unspecified: Secondary | ICD-10-CM | POA: Diagnosis not present

## 2021-12-01 DIAGNOSIS — R0989 Other specified symptoms and signs involving the circulatory and respiratory systems: Secondary | ICD-10-CM | POA: Diagnosis not present

## 2021-12-01 DIAGNOSIS — E663 Overweight: Secondary | ICD-10-CM | POA: Diagnosis not present

## 2021-12-01 DIAGNOSIS — G9341 Metabolic encephalopathy: Secondary | ICD-10-CM | POA: Diagnosis not present

## 2021-12-01 LAB — CBC
HCT: 26.6 % — ABNORMAL LOW (ref 36.0–46.0)
Hemoglobin: 9.3 g/dL — ABNORMAL LOW (ref 12.0–15.0)
MCH: 28.4 pg (ref 26.0–34.0)
MCHC: 35 g/dL (ref 30.0–36.0)
MCV: 81.3 fL (ref 80.0–100.0)
Platelets: 140 10*3/uL — ABNORMAL LOW (ref 150–400)
RBC: 3.27 MIL/uL — ABNORMAL LOW (ref 3.87–5.11)
RDW: 12.8 % (ref 11.5–15.5)
WBC: 9.7 10*3/uL (ref 4.0–10.5)
nRBC: 0 % (ref 0.0–0.2)

## 2021-12-01 LAB — BASIC METABOLIC PANEL
Anion gap: 18 — ABNORMAL HIGH (ref 5–15)
BUN: 67 mg/dL — ABNORMAL HIGH (ref 8–23)
CO2: 20 mmol/L — ABNORMAL LOW (ref 22–32)
Calcium: 8.2 mg/dL — ABNORMAL LOW (ref 8.9–10.3)
Chloride: 95 mmol/L — ABNORMAL LOW (ref 98–111)
Creatinine, Ser: 9.01 mg/dL — ABNORMAL HIGH (ref 0.44–1.00)
GFR, Estimated: 4 mL/min — ABNORMAL LOW (ref >60)
Glucose, Bld: 88 mg/dL (ref 70–99)
Potassium: 4 mmol/L (ref 3.5–5.1)
Sodium: 133 mmol/L — ABNORMAL LOW (ref 135–145)

## 2021-12-01 LAB — CK: Total CK: 437 U/L — ABNORMAL HIGH (ref 38–234)

## 2021-12-01 LAB — HEPATITIS B SURFACE ANTIGEN: Hepatitis B Surface Ag: NONREACTIVE

## 2021-12-01 MED ORDER — HYDROCODONE-ACETAMINOPHEN 10-325 MG PO TABS: 1.0000 | ORAL_TABLET | Freq: Four times a day (QID) | ORAL | Status: DC | PRN

## 2021-12-01 MED ORDER — ALPRAZOLAM 0.5 MG PO TABS
0.5000 mg | ORAL_TABLET | Freq: Four times a day (QID) | ORAL | Status: DC | PRN
  Administered 2021-12-02 – 2021-12-03 (×2): 0.5 mg via ORAL
  Filled 2021-12-01 (×2): qty 1

## 2021-12-01 NOTE — Assessment & Plan Note (Addendum)
Patient on significant amount of opiate medications.  Unclear if she has been abusing these medications.  Urine drug screen positive for opiates and benzos, which she has prescriptions for, but tricyclic antidepressants also on drug screen.  Changed opiates to as needed only.  Monitoring very closely for withdrawal.

## 2021-12-01 NOTE — Plan of Care (Signed)
Continuing with plan of care. 

## 2021-12-01 NOTE — Progress Notes (Signed)
[facility]  PROGRESS NOTE   Subjective:   Patient is awake and alert.  Able to make conversation this morning.  Had dialysis last night. Able to feed herself this morning.  Urine output has improved.  Objective:  Vital signs: Blood pressure (!) 141/75, pulse (!) 101, temperature 97.8 F (36.6 C), temperature source Axillary, resp. rate 20, height '5\' 4"'$  (1.626 m), weight 76.4 kg, SpO2 96 %.  Intake/Output Summary (Last 24 hours) at 12/01/2021 1153 Last data filed at 12/01/2021 1100 Gross per 24 hour  Intake 2473.48 ml  Output 620 ml  Net 1853.48 ml   Filed Weights   11/30/21 1629 11/30/21 1855 11/30/21 2212  Weight: 76.5 kg 76.4 kg 76.4 kg     Physical Exam: General:  No acute distress  Head:  Normocephalic, atraumatic. Moist oral mucosal membranes  Eyes:  Anicteric  Neck:  Supple  Lungs:   Clear to auscultation, normal effort  Heart:  S1S2 no rubs  Abdomen:   Soft, nontender, bowel sounds present  Extremities:  peripheral edema.  Neurologic:  Awake, alert, following commands  Skin:  No lesions  Access:     Basic Metabolic Panel: Recent Labs  Lab 11/29/21 1832 11/30/21 0423 11/30/21 1300 11/30/21 1817 12/01/21 0511  NA 123* 123* 121* 123* 133*  K 5.1 5.0 4.8 5.1 4.0  CL 81* 86* 85* 88* 95*  CO2 15* 15* 15* 15* 20*  GLUCOSE 130* 105* 111* 99 88  BUN 123* 119* 130* 125* 67*  CREATININE 16.28* 15.79* 15.21* 15.49* 9.01*  CALCIUM 8.8* 8.4* 8.0* 8.2* 8.2*  PHOS  --   --  11.0* 10.8*  --     CBC: Recent Labs  Lab 11/29/21 1832 11/30/21 0423 11/30/21 1817 12/01/21 0511  WBC 8.9 8.3 9.7 9.7  HGB 9.9* 9.0* 9.0* 9.3*  HCT 28.4* 25.4* 25.8* 26.6*  MCV 82.6 82.5 82.7 81.3  PLT 155 143* 134* 140*     Urinalysis: Recent Labs    11/29/21 2021  COLORURINE YELLOW*  LABSPEC 1.011  PHURINE 5.0  GLUCOSEU NEGATIVE  HGBUR LARGE*  BILIRUBINUR NEGATIVE  KETONESUR NEGATIVE  PROTEINUR 100*  NITRITE NEGATIVE  LEUKOCYTESUR NEGATIVE       Imaging: CT HEAD WO CONTRAST (5MM)  Result Date: 11/29/2021 CLINICAL DATA:  Fall EXAM: CT HEAD WITHOUT CONTRAST CT CERVICAL SPINE WITHOUT CONTRAST TECHNIQUE: Multidetector CT imaging of the head and cervical spine was performed following the standard protocol without intravenous contrast. Multiplanar CT image reconstructions of the cervical spine were also generated. RADIATION DOSE REDUCTION: This exam was performed according to the departmental dose-optimization program which includes automated exposure control, adjustment of the mA and/or kV according to patient size and/or use of iterative reconstruction technique. COMPARISON:  CT head 05/16/2020, no prior cervical spine CT. FINDINGS: CT HEAD FINDINGS Brain: No evidence of acute infarction, hemorrhage, cerebral edema, mass, mass effect, or midline shift. No hydrocephalus or extra-axial fluid collection. Vascular: No hyperdense vessel. Skull: Normal. Negative for fracture or focal lesion. Sinuses/Orbits: Sequela of chronic right maxillary sinusitis and prior right maxillary antrostomy and right ethmoidectomy, with partial opacification of the right ethmoid air cells. The remaining paranasal sinuses are clear. The orbits are unremarkable. Other: The mastoid air cells are well aerated. CT CERVICAL SPINE FINDINGS Alignment: No listhesis. Skull base and vertebrae: No acute fracture. No primary bone lesion or focal pathologic process. Soft tissues and spinal canal: No prevertebral fluid or swelling. No visible canal hematoma. Disc levels: No high-grade spinal canal stenosis. Uncovertebral  and facet arthropathy causes severe left neural foraminal narrowing at C3-C4 and moderate neural foraminal narrowing on the right at C3-C4 and on the left at C4-C5. Upper chest: Patchy opacities in the peripheral left upper lobe (series 3, image 97. Other: None. IMPRESSION: 1.  No acute intracranial process. 2.  No acute fracture or traumatic listhesis in the cervical spine.  3. Patchy opacities in the peripheral left upper lobe, which are nonspecific but could be infectious or inflammatory. Consider CT chest for further evaluation if clinically indicated. Electronically Signed   By: Merilyn Baba M.D.   On: 11/29/2021 19:13   CT Cervical Spine Wo Contrast  Result Date: 11/29/2021 CLINICAL DATA:  Fall EXAM: CT HEAD WITHOUT CONTRAST CT CERVICAL SPINE WITHOUT CONTRAST TECHNIQUE: Multidetector CT imaging of the head and cervical spine was performed following the standard protocol without intravenous contrast. Multiplanar CT image reconstructions of the cervical spine were also generated. RADIATION DOSE REDUCTION: This exam was performed according to the departmental dose-optimization program which includes automated exposure control, adjustment of the mA and/or kV according to patient size and/or use of iterative reconstruction technique. COMPARISON:  CT head 05/16/2020, no prior cervical spine CT. FINDINGS: CT HEAD FINDINGS Brain: No evidence of acute infarction, hemorrhage, cerebral edema, mass, mass effect, or midline shift. No hydrocephalus or extra-axial fluid collection. Vascular: No hyperdense vessel. Skull: Normal. Negative for fracture or focal lesion. Sinuses/Orbits: Sequela of chronic right maxillary sinusitis and prior right maxillary antrostomy and right ethmoidectomy, with partial opacification of the right ethmoid air cells. The remaining paranasal sinuses are clear. The orbits are unremarkable. Other: The mastoid air cells are well aerated. CT CERVICAL SPINE FINDINGS Alignment: No listhesis. Skull base and vertebrae: No acute fracture. No primary bone lesion or focal pathologic process. Soft tissues and spinal canal: No prevertebral fluid or swelling. No visible canal hematoma. Disc levels: No high-grade spinal canal stenosis. Uncovertebral and facet arthropathy causes severe left neural foraminal narrowing at C3-C4 and moderate neural foraminal narrowing on the right at  C3-C4 and on the left at C4-C5. Upper chest: Patchy opacities in the peripheral left upper lobe (series 3, image 97. Other: None. IMPRESSION: 1.  No acute intracranial process. 2.  No acute fracture or traumatic listhesis in the cervical spine. 3. Patchy opacities in the peripheral left upper lobe, which are nonspecific but could be infectious or inflammatory. Consider CT chest for further evaluation if clinically indicated. Electronically Signed   By: Merilyn Baba M.D.   On: 11/29/2021 19:13   US Renal  Result Date: 11/29/2021 CLINICAL DATA:  Elevated creatinine EXAM: RENAL / URINARY TRACT ULTRASOUND COMPLETE COMPARISON:  06/23/2018 FINDINGS: Right Kidney: Renal measurements: 12.4 x 7.2 x 7.3 cm. = volume: 340 mL. Mild increased echogenicity is noted. No mass or hydronephrosis is noted. Left Kidney: Renal measurements: 11.5 x 5.1 x 5.7 cm. = volume: 174 mL. Mild increased echogenicity is noted. No mass or hydronephrosis is noted. Bladder: Appears normal for degree of bladder distention. Other: None. IMPRESSION: Mild increased echogenicity consistent with medical renal disease. No other focal abnormality is noted. Electronically Signed   By: Inez Catalina M.D.   On: 11/29/2021 21:21   DG Chest Port 1 View  Result Date: 11/30/2021 CLINICAL DATA:  Central line placement. EXAM: PORTABLE CHEST 1 VIEW COMPARISON:  None Available. FINDINGS: The heart size and mediastinal contours are within normal limits. Mild-to-moderate interstitial and airspace opacities are seen diffusely throughout the left breast. Mild right basilar interstitial and airspace opacities are  noted. There is no pleural effusion or pneumothorax. The visualized skeletal structures are unremarkable. A left internal jugular central venous catheter tip overlies the expected location of the confluence of the left brachiocephalic vein and superior vena cava. IMPRESSION: 1. Left internal jugular central venous catheter tip overlies the expected location  of the confluence of the left brachiocephalic vein and superior vena cava. No pneumothorax. 2. Mild-to-moderate left and mild right interstitial and airspace opacities may represent pulmonary edema and/or pneumonia. Electronically Signed   By: Zerita Boers M.D.   On: 11/30/2021 17:40   DG Abd 2 Views  Result Date: 11/30/2021 CLINICAL DATA:  66 year old female with abdominal pain. EXAM: ABDOMEN - 2 VIEW COMPARISON:  None Available. FINDINGS: Stool and gas in the colon and rectum noted. Nondistended gas-filled loops of small bowel are present. No dilated bowel loops are noted to suggest bowel obstruction. There is no evidence of pneumoperitoneum. No suspicious calcifications are identified. IMPRESSION: 1. Nonspecific nonobstructive bowel gas pattern. No evidence of pneumoperitoneum. 2. No evidence of suspicious calcifications. Electronically Signed   By: Margarette Canada M.D.   On: 11/30/2021 10:48     Medications:    sodium chloride 125 mL/hr at 12/01/21 0934   sodium chloride     sodium chloride     cefTRIAXone (ROCEPHIN)  IV Stopped (11/30/21 1502)    amLODipine  5 mg Oral q morning   Chlorhexidine Gluconate Cloth  6 each Topical Q0600   Chlorhexidine Gluconate Cloth  6 each Topical Q0600   cholecalciferol   Oral Daily   cyclobenzaprine  10 mg Oral TID   fentaNYL (SUBLIMAZE) injection  25 mcg Intravenous Once   fentaNYL (SUBLIMAZE) injection  25 mcg Intravenous Once   heparin  5,000 Units Subcutaneous Q8H   multivitamin with minerals  1 tablet Oral Daily    Assessment/ Plan:     66 y.o. female with a PMHx of hypertension, hyperlipidemia, fibromyalgia, chronic pain syndrome and migraine headaches now admitted via the emergency room yesterday with history of mental status changes.  She was found to have a creatinine of 16.2 with a BUN of 123.  She also has a sodium of 123 yesterday.  Her CPK level on admission was 1375.  She was started on IV fluid resuscitation with minimal improvement of  urine output.She had a renal sonogram done in the emergency room which was negative.  She was dialyzed last night with improvement in her mentation this morning.   Principal Problem:   AKI (acute kidney injury) ([place]) Active Problems:   Rhabdomyolysis   Essential hypertension   Dyslipidemia   Anxiety   Acute metabolic encephalopathy   Overweight (BMI 25.0-29.9)   Abnormal finding of lung   #1: Acute kidney injury: Patient has acute kidney injury with metabolic acidosis and hyponatremia with decreased urine output.  She was dialyzed last night.  Sodium has improved this morning.  Renal indicis are better.  Urine output has been improving.     #2: Hyponatremia: Hyponatremia most likely secondary to decreased p.o. intake.  We will continue the isotonic saline hydration.   #3: Metabolic acidosis: Metabolic acidosis may be secondary to renal failure complicated by possible infection.  Bicarb improved after dialysis.   #4: Rhabdomyolysis: CPK levels are improving.  We will continue IV fluid resuscitation.   #5: Hypertension: Continue amlodipine as ordered.   #6: Sepsis/infection: Continue the Rocephin.  We will continue to monitor the urine output closely and decide on further dialysis treatments tomorrow morning.  Serological tests are still pending. Case discussed with PMD and ICU team.  Spoke to patient's son last night.    LOS: [place], MD [facility] 5/21/202311:53 AM

## 2021-12-01 NOTE — Progress Notes (Signed)
[facility] Progress Note  Patient: Carla Gomez    NWG:[id number]  DOA: 11/29/2021    Date of Service: the patient was seen and examined on 12/01/2021  Brief hospital course: 66 year old female with past medical history of fibromyalgia, hypertension and chronic pain who presented to the emergency room on 5/19 with altered mental status.  Patient was brought in by EMS after her neighbors noted she was shouting and confused.  In the emergency room, patient found to have urinary tract infection, sodium of 123 and creatinine of 16.28 with a BUN of 123.  Last documented labs on this patient for from 2017 noting normal renal function at that time.  CK level at 1375.  Renal ultrasound done noted some chronic renal disease, but no atrophy or hydronephrosis.  Patient was admitted to the hospital service and started on IV fluids.  Nephrology was consulted.  Although labs on 5/20 morning noted mildly improving creatinine with some mild urine output, follow-up labs that afternoon noted no urine output and worsening creatinine despite IV fluids.  Also developing myoclonus.  Patient transferred to stepdown and dialysis catheter placed and patient underwent hemodialysis.  By 5/21, patient still confused, but a bit more awake and alert.  Assessment and Plan: Assessment and Plan: * AKI (acute kidney injury) ([place]) Unclear etiology.  Possibly prerenal plus mild rhabdo?  Patient on a strong amount of opiates and benzos and it is possible, that she may have taken some illicit substances although this is unknown.  ESR significantly elevated and work-up continuing.  Nephrology following.  Status post dialysis catheter placement and first hemodialysis session done 5/20.  Having increased urine output and somewhat improvement in mentation by 5/21.   Acute metabolic encephalopathy Secondary to uremia brought on by renal failure.  Patient lives alone and we do not have a baseline or a timeframe for when her  mentation began to change.  Some improvement from previous day following dialysis.  Rhabdomyolysis Mild.  Continue IV fluids.  CK continues to trend downward, currently at 437.  Essential hypertension - We will continue Norvasc.  Blood pressure is have been stable, in part due to intravascular volume depletion.  Chronic pain Patient on significant amount of opiate medications.  Unclear if she has been abusing these medications.  Urine drug screen positive for opiates and benzos, which she has prescriptions for, but tricyclic antidepressants also on drug screen.  Changed opiates to as needed only.  Monitoring very closely for withdrawal.  Abnormal finding of lung CT scan of C-spine noted incidental finding of opacity in left upper lobe.  Could be infection versus inflammatory.  CT scan of chest to follow  Anxiety Have changed Xanax from scheduled to as needed and decrease dose.  Monitoring for withdrawal.  Dyslipidemia - We will continue statin therapy.  Overweight (BMI 25.0-29.9) Meets criteria BMI greater than 25       Body mass index is 28.91 kg/m.        Consultants: Nephrology Critical care  Procedures: Renal ultrasound: Mild medical disease.  No hydronephrosis. Placement of temporary dialysis catheter 5/20 Initiation of hemodialysis 5/20  Antimicrobials: None  Code Status: Full code   Subjective: Patient still quite confused although a bit more alert and interactive than previous day.  Not able to give history.  Complains of pain in random different places although nothing consistent.  Objective: Vital signs were reviewed and unremarkable. Vitals:   12/01/21 1300 12/01/21 1400  BP: (!) 143/77 (!) 147/75  Pulse: Marland Kitchen)  104 (!) 103  Resp: 17 15  Temp:  97.6 F (36.4 C)  SpO2: 92% 92%    Intake/Output Summary (Last 24 hours) at 12/01/2021 1453 Last data filed at 12/01/2021 1300 Gross per 24 hour  Intake 3276.65 ml  Output 980 ml  Net 2296.65 ml     Filed Weights   11/30/21 1629 11/30/21 1855 11/30/21 2212  Weight: 76.5 kg 76.4 kg 76.4 kg   Body mass index is 28.91 kg/m.  Exam:  General: Still confused.  Oriented x2 HEENT: Normocephalic, atraumatic, mucous membranes are dry.  Noted temporary dialysis catheter in left IJ Cardiovascular: Regular rate and rhythm, S1-S2 Respiratory: Clear to auscultation bilaterally Abdomen: Soft, nontender, nondistended, positive bowel sounds Musculoskeletal: No clubbing or cyanosis.  Trace pitting edema Skin: No skin breaks, tears or lesions Psychiatry: Acutely delirious, no evidence of psychoses Neurology: Exam limited to patient's somnolence, but no focal deficits  Data Reviewed: Sodium much improved to 133.  Hemoglobin stable.  CK down to 437.  Creatinine down to 9 following hemodialysis the previous day.  Hepatitis panel negative.  Noted elevated ESR at 106.  Disposition:  Status is: Inpatient Remains inpatient appropriate because: Renal failure work-up.  Stabilization of renal function.    Anticipated discharge date: 5/26  Family Communication: Left message for son DVT Prophylaxis: heparin injection 5,000 Units Start: 11/29/21 2300    Author: Annita Brod ,MD 12/01/2021 2:53 PM  To reach On-call, see care teams to locate the attending and reach out via [email or web address]. Between 7PM-7AM, please contact night-coverage If you still have difficulty reaching the attending provider, please page the [facility] (Director on Call) for [facility] on amion for assistance.

## 2021-12-02 DIAGNOSIS — G9341 Metabolic encephalopathy: Secondary | ICD-10-CM | POA: Diagnosis not present

## 2021-12-02 DIAGNOSIS — N390 Urinary tract infection, site not specified: Secondary | ICD-10-CM | POA: Diagnosis present

## 2021-12-02 DIAGNOSIS — N179 Acute kidney failure, unspecified: Secondary | ICD-10-CM | POA: Diagnosis not present

## 2021-12-02 DIAGNOSIS — E663 Overweight: Secondary | ICD-10-CM | POA: Diagnosis not present

## 2021-12-02 DIAGNOSIS — R0989 Other specified symptoms and signs involving the circulatory and respiratory systems: Secondary | ICD-10-CM | POA: Diagnosis not present

## 2021-12-02 LAB — BASIC METABOLIC PANEL
Anion gap: 18 — ABNORMAL HIGH (ref 5–15)
BUN: 76 mg/dL — ABNORMAL HIGH (ref 8–23)
CO2: 16 mmol/L — ABNORMAL LOW (ref 22–32)
Calcium: 8.4 mg/dL — ABNORMAL LOW (ref 8.9–10.3)
Chloride: 97 mmol/L — ABNORMAL LOW (ref 98–111)
Creatinine, Ser: 9.01 mg/dL — ABNORMAL HIGH (ref 0.44–1.00)
GFR, Estimated: 4 mL/min — ABNORMAL LOW (ref >60)
Glucose, Bld: 94 mg/dL (ref 70–99)
Potassium: 4 mmol/L (ref 3.5–5.1)
Sodium: 131 mmol/L — ABNORMAL LOW (ref 135–145)

## 2021-12-02 LAB — ANA COMPREHENSIVE PANEL
Anti JO-1: <0.2 AI (ref 0.0–0.9)
Centromere Ab Screen: <0.2 AI (ref 0.0–0.9)
Chromatin Ab SerPl-aCnc: <0.2 AI (ref 0.0–0.9)
ENA SM Ab Ser-aCnc: <0.2 AI (ref 0.0–0.9)
Ribonucleic Protein: 0.3 AI (ref 0.0–0.9)
SSA (Ro) (ENA) Antibody, IgG: <0.2 AI (ref 0.0–0.9)
SSB (La) (ENA) Antibody, IgG: <0.2 AI (ref 0.0–0.9)
Scleroderma (Scl-70) (ENA) Antibody, IgG: <0.2 AI (ref 0.0–0.9)
ds DNA Ab: <1 IU/mL (ref 0–9)

## 2021-12-02 LAB — CK: Total CK: 246 U/L — ABNORMAL HIGH (ref 38–234)

## 2021-12-02 LAB — GLUCOSE, CAPILLARY: Glucose-Capillary: 102 mg/dL — ABNORMAL HIGH (ref 70–99)

## 2021-12-02 MED ORDER — HEPARIN SODIUM (PORCINE) 1000 UNIT/ML IJ SOLN
INTRAMUSCULAR | Status: AC
  Filled 2021-12-02: qty 10

## 2021-12-02 NOTE — Progress Notes (Signed)
Patient alert to self. Disoriented time, place, situation. Refuses medications at this time. States she may take them later when she wants to. Multiple attempts by this RN to assist patient with breakfast, patient continues to refuse food. Requested water and provided to her by this RN. Conversed with patient regarding removal of mittens. Explained our goal is for patient to be safely oriented without pulling at lines or harming self, upon reaching that goal mittens will be removed.

## 2021-12-02 NOTE — Progress Notes (Signed)
Hemodialysis Post Treatment Note  Dec 02, 2021  Access: LIJ Catheter   Treatment Time: 3 Hours  UF Removed: 0 ml  Next Scheduled Treatment: TBD  NOTE: Patient presents for scheduled 3-hour treatment, LIJ catheter intact, no signs of infection, dressing changed on 12/01/21. Patient tolerates treatment without significant incident. Patient initially sleepy, withdrawn, but awaken after second hour of treatment, becoming very vocal, fling arms, attempting to climb out of bed. Patient was easily redirected, and able to complete treatment. CVC maintained prescribed BFR, UF target met, patient stated during course of treatment she was nausea and was given NS bolus which appear to resolve issue. Patient very restless and combative at end of treatment, striking nurse and refusing assistance. Patient was able to complete treatment, and was transported back to assigned room.

## 2021-12-02 NOTE — Progress Notes (Signed)
[facility] Progress Note  Patient: Carla Gomez    EGB:[id number]  DOA: 11/29/2021    Date of Service: the patient was seen and examined on 12/02/2021  Brief hospital course: 66 year old female with past medical history of fibromyalgia, hypertension and chronic pain who presented to the emergency room on 5/19 with altered mental status.  Patient was brought in by EMS after her neighbors noted she was shouting and confused.  In the emergency room, patient found to have urinary tract infection, sodium of 123 and creatinine of 16.28 with a BUN of 123.  Last documented labs on this patient for from 2017 noting normal renal function at that time.  CK level at 1375.  Renal ultrasound done noted some chronic renal disease, but no atrophy or hydronephrosis.  Patient was admitted to the hospital service and started on IV fluids.  Nephrology was consulted.  Although labs on 5/20 morning noted mildly improving creatinine with some mild urine output, follow-up labs that afternoon noted no urine output and worsening creatinine despite IV fluids.  Also developing myoclonus.  Patient transferred to stepdown and dialysis catheter placed and patient underwent hemodialysis.  By 5/21, patient still confused, but a bit more awake and alert.  She has since continued to have good urine output, but by 5/22 more drowsy and BUN rising.  Nephrology plans to repeat hemodialysis 5/22 without filtration.  Assessment and Plan: Assessment and Plan: * AKI (acute kidney injury) ([place]) Unclear etiology.  Possibly prerenal plus mild rhabdo?  Patient on a strong amount of opiates and benzos and it is possible, that she may have taken some illicit substances although this is unknown.  ESR significantly elevated and work-up in progress.  Nephrology following.  Status post dialysis catheter placement and first hemodialysis session done 5/20.  Having increased urine output and somewhat improvement in mentation by 5/21.  However by  5/22, BUN rising and mentation worsening, so more hemodialysis planned for 5/22.  Acute metabolic encephalopathy Secondary to uremia brought on by renal failure.  Patient lives alone and we do not have a baseline or a timeframe for when her mentation began to change.  Some improvement on 5/21, but by 5/22 is more somnolent and confused  Rhabdomyolysis Mild.  Continue IV fluids.  CK continues to trend downward, currently at 246.  Essential hypertension - We will continue Norvasc.  Blood pressure initially stable, but have started to trend upward and are now in the 371G to 626R systolic.  Likely due to renal failure and some volume overload.  UTI (urinary tract infection) Urine cultures are pending.  Have started IV Rocephin.  A contributing factor, but certainly not the main cause of her renal injury.  Sepsis has been ruled out.  Chronic pain Patient on significant amount of opiate medications.  Unclear if she has been abusing these medications.  Urine drug screen positive for opiates and benzos, which she has prescriptions for, but tricyclic antidepressants also on drug screen.  Changed opiates to as needed only.  Monitoring very closely for withdrawal.  Abnormal finding of lung CT scan of C-spine noted incidental finding of opacity in left upper lobe.  Could be infection versus inflammatory.  CT scan of chest to follow when patient is more lucid and can lie still.  Incidentally, chest x-ray done on 5/21 (because patient had been pulling at her dialysis catheter line) noted significant improvement in infiltrate.  Anxiety Have changed Xanax from scheduled to as needed and decrease dose.  Monitoring for  withdrawal.  Dyslipidemia - We will continue statin therapy.  Overweight (BMI 25.0-29.9) Meets criteria BMI greater than 25       Body mass index is 29.71 kg/m.        Consultants: Nephrology Critical care  Procedures: Renal ultrasound: Mild medical disease.  No  hydronephrosis. Placement of temporary dialysis catheter 5/20 Hemodialysis 5/20, 5/22  Antimicrobials: None  Code Status: Full code   Subjective: Patient confused, more somnolent today.  Objective: Vital signs were reviewed and unremarkable. Vitals:   12/02/21 1345 12/02/21 1400  BP: (!) 146/81 (!) 149/79  Pulse: (!) 101 (!) 102  Resp: (!) 24 (!) 25  Temp:    SpO2: 98% 99%    Intake/Output Summary (Last 24 hours) at 12/02/2021 1417 Last data filed at 12/02/2021 1200 Gross per 24 hour  Intake 3708.25 ml  Output 1500 ml  Net 2208.25 ml    Filed Weights   11/30/21 1855 11/30/21 2212 12/02/21 1321  Weight: 76.4 kg 76.4 kg 78.5 kg   Body mass index is 29.71 kg/m.  Exam:  General: Still confused.  Oriented x1 at best, somnolent HEENT: Normocephalic, atraumatic, mucous membranes are dry.  Noted temporary dialysis catheter in left IJ Cardiovascular: Regular rate and rhythm, S1-S2, 2 out of 6 systolic ejection murmur Respiratory: Clear to auscultation bilaterally Abdomen: Soft, nontender, minimal distention, hypoactive bowel sounds Musculoskeletal: No clubbing or cyanosis.  Trace pitting edema Skin: No skin breaks, tears or lesions Psychiatry: Acutely delirious, no evidence of psychoses Neurology: Exam limited to patient's somnolence, but no focal deficits  Data Reviewed: Sodium at 131.  Anion gap of 18, secondary to uremia.  CK down to 46.  Hemoglobin stable at 9.3.  Creatinine of 9, unchanged from previous day with slightly rising BUN.  Disposition:  Status is: Inpatient Remains inpatient appropriate because: Renal failure work-up.  Stabilization of renal function.    Anticipated discharge date: 5/26  Family Communication: Left message for son DVT Prophylaxis: heparin injection 5,000 Units Start: 11/29/21 2300    Author: Annita Brod ,MD 12/02/2021 2:17 PM  To reach On-call, see care teams to locate the attending and reach out via  [email or web address]. Between 7PM-7AM, please contact night-coverage If you still have difficulty reaching the attending provider, please page the [facility] (Director on Call) for [facility] on amion for assistance.

## 2021-12-02 NOTE — Progress Notes (Signed)
Patient agreeable to eat pancake. This RN assisted patient to eat pancake and drink water. Patient states she is resting comfortably in bed without pain at this time.   Urine for analysis collected and sent to Lab.

## 2021-12-02 NOTE — Progress Notes (Signed)
Patient refused temperature check at 0800.   States "we do not want her to get better."   Refuses to answer any questions for me.   Will continue to provide comfort and try to establish rapport with patient.

## 2021-12-02 NOTE — Progress Notes (Signed)
Patient off floor to HD. Report given to HD RN Lorriane Shire.

## 2021-12-02 NOTE — Assessment & Plan Note (Addendum)
Urine cultures with no growth.  Treated with IV Rocephin, stopped antibiotics.  A contributing factor, but certainly not the main cause of her renal injury.  Sepsis has been ruled out.

## 2021-12-02 NOTE — Progress Notes (Signed)
Patient returned to room after completion of HD around 1700. Patient oriented to self only. Increasing anxiety, trying to climb out of bed, waving arms at staff in threatening manner to remove mittens. Complains of hearing voices. Also complains of left foot pain. Offered PRN Oxycodone for pain and Xanax for anxiety. Patient said "whatever you are doing I don't care, give me my mind medication". This was interpreted by this RN as Xanax and administered to patient. Patient swallowed Xanax and stated, " I just took a Xanax, thank you." Patient continues to verbalize she is being held at this "store" against her will and we are making her sell items to receive care. Repeatedly states she is worried about her health but will give no further details when asked what she means. Disoriented and incomprehensible speech mostly observed by this RN. Patient offered water and Carmex, accepted both. Educated she can not have mittens removed until she is oriented enough to be safe and not cause self harm. This RN has reassured patient we are here to help her get better and her kidneys are not functioning properly, causing confusion. No evidence of learning. Will continue to monitor closely.

## 2021-12-02 NOTE — Progress Notes (Addendum)
[facility]  PROGRESS NOTE   Subjective:   Patient appears drowsy, inaudible speech Responds to verbal stimuli Lower extremity edema remains Room air  Creatinine 9.01 UOP 1.6L via foley in proceeding 24 hours  Objective:  Vital signs: Blood pressure (!) 147/82, pulse (!) 101, temperature 97.8 F (36.6 C), temperature source Oral, resp. rate (!) 22, height '5\' 4"'$  (1.626 m), weight 78.5 kg, SpO2 97 %.  Intake/Output Summary (Last 24 hours) at 12/02/2021 1325 Last data filed at 12/02/2021 1200 Gross per 24 hour  Intake 3708.25 ml  Output 1500 ml  Net 2208.25 ml    Filed Weights   11/30/21 1855 11/30/21 2212 12/02/21 1321  Weight: 76.4 kg 76.4 kg 78.5 kg     Physical Exam: General:  No acute distress  Head:  Normocephalic, atraumatic. Moist oral mucosal membranes  Eyes:  Anicteric  Lungs:   Clear to auscultation, normal effort  Heart:  S1S2 no rubs  Abdomen:   Soft, nontender, bowel sounds present  Extremities:  1+ peripheral edema.  Neurologic:  Awake, alert, following commands  Skin:  No lesions  Access: Lt IJ temp cath    Basic Metabolic Panel: Recent Labs  Lab 11/30/21 0423 11/30/21 1300 11/30/21 1817 12/01/21 0511 12/02/21 0130  NA 123* 121* 123* 133* 131*  K 5.0 4.8 5.1 4.0 4.0  CL 86* 85* 88* 95* 97*  CO2 15* 15* 15* 20* 16*  GLUCOSE 105* 111* 99 88 94  BUN 119* 130* 125* 67* 76*  CREATININE 15.79* 15.21* 15.49* 9.01* 9.01*  CALCIUM 8.4* 8.0* 8.2* 8.2* 8.4*  PHOS  --  11.0* 10.8*  --   --      CBC: Recent Labs  Lab 11/29/21 1832 11/30/21 0423 11/30/21 1817 12/01/21 0511  WBC 8.9 8.3 9.7 9.7  HGB 9.9* 9.0* 9.0* 9.3*  HCT 28.4* 25.4* 25.8* 26.6*  MCV 82.6 82.5 82.7 81.3  PLT 155 143* 134* 140*      Urinalysis: Recent Labs    11/29/21 2021  COLORURINE YELLOW*  LABSPEC 1.011  PHURINE 5.0  GLUCOSEU NEGATIVE  HGBUR LARGE*  BILIRUBINUR NEGATIVE  KETONESUR NEGATIVE  PROTEINUR 100*  NITRITE NEGATIVE  LEUKOCYTESUR  NEGATIVE       Imaging: DG Chest 1 View  Result Date: 12/01/2021 CLINICAL DATA:  Evaluate central line placement EXAM: CHEST  1 VIEW COMPARISON:  Nov 30, 2021 FINDINGS: The double lumen left central line terminates near the brachiocephalic confluence, stable. No pneumothorax. No improving left pulmonary infiltrate. No other interval changes. IMPRESSION: 1. The left central line is stable with the distal tip terminating near the brachiocephalic confluence. 2. Significant improvement in left-sided infiltrate. Electronically Signed   By: Dorise Bullion III M.D.   On: 12/01/2021 15:37   DG Chest Port 1 View  Result Date: 11/30/2021 CLINICAL DATA:  Central line placement. EXAM: PORTABLE CHEST 1 VIEW COMPARISON:  None Available. FINDINGS: The heart size and mediastinal contours are within normal limits. Mild-to-moderate interstitial and airspace opacities are seen diffusely throughout the left breast. Mild right basilar interstitial and airspace opacities are noted. There is no pleural effusion or pneumothorax. The visualized skeletal structures are unremarkable. A left internal jugular central venous catheter tip overlies the expected location of the confluence of the left brachiocephalic vein and superior vena cava. IMPRESSION: 1. Left internal jugular central venous catheter tip overlies the expected location of the confluence of the left brachiocephalic vein and superior vena cava. No pneumothorax. 2. Mild-to-moderate left and mild right interstitial and  airspace opacities may represent pulmonary edema and/or pneumonia. Electronically Signed   By: Zerita Boers M.D.   On: 11/30/2021 17:40     Medications:    sodium chloride 125 mL/hr at 12/02/21 1200   sodium chloride     sodium chloride     cefTRIAXone (ROCEPHIN)  IV Stopped (12/01/21 1458)    amLODipine  5 mg Oral q morning   Chlorhexidine Gluconate Cloth  6 each Topical Q0600   Chlorhexidine Gluconate Cloth  6 each Topical Q0600    cholecalciferol   Oral Daily   cyclobenzaprine  10 mg Oral TID   heparin  5,000 Units Subcutaneous Q8H   multivitamin with minerals  1 tablet Oral Daily    Assessment/ Plan:     66 y.o. female with a PMHx of hypertension, hyperlipidemia, fibromyalgia, chronic pain syndrome and migraine headaches now admitted via the emergency room yesterday with history of mental status changes.  She was found to have a creatinine of 16.2 with a BUN of 123.  She also has a sodium of 123 yesterday.  Her CPK level on admission was 1375.  She was started on IV fluid resuscitation with minimal improvement of urine output.She had a renal sonogram done in the emergency room which was negative.  She was dialyzed last night with improvement in her mentation this morning.   Principal Problem:   AKI (acute kidney injury) ([place]) Active Problems:   Rhabdomyolysis   Essential hypertension   Dyslipidemia   Anxiety   Acute metabolic encephalopathy   Overweight (BMI 25.0-29.9)   Abnormal finding of lung   #1: Acute kidney injury: Patient has acute kidney injury with metabolic acidosis and hyponatremia with decreased urine output.   Dialysis initiated on Saturday due to decreased urine output and mental status. Creatinine has improved and patient making adequate urine ouput. However mentation remains a concern and BUN increased at 76. Will provide additional dialysis treatment today with no UF. May hold dialysis after this treatment to evaluate kidney function and need for further treatments.      #2: Hyponatremia: Hyponatremia most likely secondary to decreased p.o. intake.  Improved over weekend. Will continue to improve with dialysis and oral nutrition. Patient encouraged to increase oral intake.    #3: Metabolic acidosis: Metabolic acidosis may be secondary to renal failure complicated by possible infection.  Tends to improve after dialysis. Will continue to evaluate during renal recovery.    #4: Rhabdomyolysis: CPK  levels continue to improve.  Continue IV fluid resuscitation and encourage oral intake.    #5: Hypertension: Receiving Amlodipine daily. BP 138/83    LOS: 3 [facility] 5/22/20231:25 PM

## 2021-12-03 DIAGNOSIS — G9341 Metabolic encephalopathy: Secondary | ICD-10-CM | POA: Diagnosis not present

## 2021-12-03 DIAGNOSIS — N179 Acute kidney failure, unspecified: Secondary | ICD-10-CM | POA: Diagnosis not present

## 2021-12-03 DIAGNOSIS — I10 Essential (primary) hypertension: Secondary | ICD-10-CM | POA: Diagnosis not present

## 2021-12-03 DIAGNOSIS — M6282 Rhabdomyolysis: Secondary | ICD-10-CM | POA: Diagnosis not present

## 2021-12-03 LAB — BASIC METABOLIC PANEL
Anion gap: 13 (ref 5–15)
BUN: 39 mg/dL — ABNORMAL HIGH (ref 8–23)
CO2: 22 mmol/L (ref 22–32)
Calcium: 8.2 mg/dL — ABNORMAL LOW (ref 8.9–10.3)
Chloride: 101 mmol/L (ref 98–111)
Creatinine, Ser: 4.82 mg/dL — ABNORMAL HIGH (ref 0.44–1.00)
GFR, Estimated: 9 mL/min — ABNORMAL LOW (ref >60)
Glucose, Bld: 99 mg/dL (ref 70–99)
Potassium: 3.4 mmol/L — ABNORMAL LOW (ref 3.5–5.1)
Sodium: 136 mmol/L (ref 135–145)

## 2021-12-03 LAB — ANCA PROFILE
Anti-MPO Antibodies: <0.2 units (ref 0.0–0.9)
Anti-PR3 Antibodies: <0.2 units (ref 0.0–0.9)
Atypical P-ANCA titer: <1:20 {titer}
C-ANCA: <1:20 {titer}
P-ANCA: <1:20 {titer}

## 2021-12-03 LAB — URINE CULTURE: Culture: NO GROWTH

## 2021-12-03 LAB — HEPATITIS B SURFACE ANTIBODY, QUANTITATIVE: Hep B S AB Quant (Post): <3.1 m[IU]/mL — ABNORMAL LOW (ref >9.9)

## 2021-12-03 LAB — CK: Total CK: 129 U/L (ref 38–234)

## 2021-12-03 MED ORDER — METOPROLOL TARTRATE 25 MG PO TABS
25.0000 mg | ORAL_TABLET | Freq: Two times a day (BID) | ORAL | Status: DC
  Filled 2021-12-03 (×2): qty 1

## 2021-12-03 MED ORDER — POTASSIUM CHLORIDE CRYS ER 20 MEQ PO TBCR
40.0000 meq | EXTENDED_RELEASE_TABLET | Freq: Once | ORAL | Status: DC
  Filled 2021-12-03: qty 2

## 2021-12-03 NOTE — Progress Notes (Signed)
[facility]  PROGRESS NOTE   Subjective:   Patient seen resting quietly, appears drowsy but arousable for short periods. Appetite remains poor Remains on room air  Creatinine 4.82 UOP 1.45L via foley in proceeding 24 hours  Objective:  Vital signs: Blood pressure (!) 151/86, pulse (!) 102, temperature 98.3 F (36.8 C), temperature source Axillary, resp. rate 19, height '5\' 4"'$  (1.626 m), weight 78.5 kg, SpO2 94 %.  Intake/Output Summary (Last 24 hours) at 12/03/2021 1118 Last data filed at 12/03/2021 1047 Gross per 24 hour  Intake 2554.96 ml  Output 1435 ml  Net 1119.96 ml    Filed Weights   11/30/21 2212 12/02/21 1321 12/02/21 1637  Weight: 76.4 kg 78.5 kg 78.5 kg     Physical Exam: General:  No acute distress  Head:  Normocephalic, atraumatic. Moist oral mucosal membranes  Eyes:  Anicteric  Lungs:   Clear to auscultation, normal effort  Heart:  S1S2 no rubs  Abdomen:   Soft, nontender, bowel sounds present  Extremities:  1+ peripheral edema.  Neurologic:  Awake, alert, following commands  Skin:  No lesions  Access: Lt IJ temp cath  Gu Foley  Basic Metabolic Panel: Recent Labs  Lab 11/30/21 1300 11/30/21 1817 12/01/21 0511 12/02/21 0130 12/03/21 0327  NA 121* 123* 133* 131* 136  K 4.8 5.1 4.0 4.0 3.4*  CL 85* 88* 95* 97* 101  CO2 15* 15* 20* 16* 22  GLUCOSE 111* 99 88 94 99  BUN 130* 125* 67* 76* 39*  CREATININE 15.21* 15.49* 9.01* 9.01* 4.82*  CALCIUM 8.0* 8.2* 8.2* 8.4* 8.2*  PHOS 11.0* 10.8*  --   --   --      CBC: Recent Labs  Lab 11/29/21 1832 11/30/21 0423 11/30/21 1817 12/01/21 0511  WBC 8.9 8.3 9.7 9.7  HGB 9.9* 9.0* 9.0* 9.3*  HCT 28.4* 25.4* 25.8* 26.6*  MCV 82.6 82.5 82.7 81.3  PLT 155 143* 134* 140*      Urinalysis: No results for input(s): COLORURINE, LABSPEC, PHURINE, GLUCOSEU, HGBUR, BILIRUBINUR, KETONESUR, PROTEINUR, UROBILINOGEN, NITRITE, LEUKOCYTESUR in the last 72 hours.  Invalid input(s): APPERANCEUR      Imaging: DG Chest 1 View  Result Date: 12/01/2021 CLINICAL DATA:  Evaluate central line placement EXAM: CHEST  1 VIEW COMPARISON:  Nov 30, 2021 FINDINGS: The double lumen left central line terminates near the brachiocephalic confluence, stable. No pneumothorax. No improving left pulmonary infiltrate. No other interval changes. IMPRESSION: 1. The left central line is stable with the distal tip terminating near the brachiocephalic confluence. 2. Significant improvement in left-sided infiltrate. Electronically Signed   By: Dorise Bullion III M.D.   On: 12/01/2021 15:37     Medications:    sodium chloride 50 mL/hr at 12/03/21 1047    amLODipine  5 mg Oral q morning   Chlorhexidine Gluconate Cloth  6 each Topical Q0600   Chlorhexidine Gluconate Cloth  6 each Topical Q0600   cholecalciferol   Oral Daily   heparin  5,000 Units Subcutaneous Q8H   multivitamin with minerals  1 tablet Oral Daily    Assessment/ Plan:     66 y.o. female with a PMHx of hypertension, hyperlipidemia, fibromyalgia, chronic pain syndrome and migraine headaches now admitted via the emergency room yesterday with history of mental status changes.  She was found to have a creatinine of 16.2 with a BUN of 123.  She also has a sodium of 123 yesterday.  Her CPK level on admission was 1375.  She  was started on IV fluid resuscitation with minimal improvement of urine output.She had a renal sonogram done in the emergency room which was negative.  She was dialyzed last night with improvement in her mentation this morning.   Principal Problem:   AKI (acute kidney injury) ([place]) Active Problems:   Rhabdomyolysis   Essential hypertension   Dyslipidemia   Anxiety   Acute metabolic encephalopathy   Overweight (BMI 25.0-29.9)   Abnormal finding of lung   #1: Acute kidney injury: Patient has acute kidney injury with metabolic acidosis and hyponatremia with decreased urine output.   Patient received second dialysis treatment  yesterday, treatment began with patient drowsy with little verbal response.  Patient ended 3-hour treatment awake and confused, according to HD RN note.  Patient stated HD RN attempting to harm her with dialysis.  (Please see HD RN note) dialysis completed with no UF due to adequate urine output.  We will hold dialysis at this time to determine renal recovery and improve mentation.  We will decide daily if additional dialysis is needed.     #2: Hyponatremia: Hyponatremia most likely secondary to decreased p.o. intake.  Improved over weekend. Will continue to improve with dialysis and oral nutrition.   Sodium corrected with dialysis.   #3: Acute Metabolic acidosis: Metabolic acidosis may be secondary to renal failure complicated by possible infection.   ` Bicarb tends to improve after dialysis.  We will continue to monitor   #4: Rhabdomyolysis: resolved with IV fluids.  Will reduce IV fluids to normal saline 50 mL/h.   #5: Hypertension: Receiving Amlodipine daily.   Blood pressure currently 151/86 today    LOS: [facility] 5/23/202311:18 AM

## 2021-12-03 NOTE — Progress Notes (Signed)
Patient refused all morning medications. Education provided and reinforced. Patient will not take medications at this time. She repeatedly verbalizes "No water today." And "I do not take medications."

## 2021-12-03 NOTE — Progress Notes (Signed)
Refused breakfast. Tray remains in room and will continue to offer patient food.

## 2021-12-03 NOTE — Progress Notes (Signed)
Progress Note   Patient: Carla Gomez EZM:[id number] DOB: 25-Nov-1955 DOA: 11/29/2021     4 DOS: the patient was seen and examined on 12/03/2021   Brief hospital course: 66 year old female with past medical history of fibromyalgia, hypertension and chronic pain who presented to the emergency room on 5/19 with altered mental status.  Patient was brought in by EMS after her neighbors noted she was shouting and confused.  In the emergency room, patient found to have urinary tract infection, sodium of 123 and creatinine of 16.28 with a BUN of 123.  Last documented labs on this patient for from 2017 noting normal renal function at that time.  CK level at 1375.  Renal ultrasound done noted some chronic renal disease, but no atrophy or hydronephrosis.  Patient was admitted to the hospital service and started on IV fluids.  Nephrology was consulted.  Although labs on 5/20 morning noted mildly improving creatinine with some mild urine output, follow-up labs that afternoon noted no urine output and worsening creatinine despite IV fluids.  Also developing myoclonus.  Patient transferred to stepdown and dialysis catheter placed and patient underwent hemodialysis.  By 5/21, patient still confused, but a bit more awake and alert.  She has since continued to have good urine output, but by 5/22 more drowsy and BUN rising.  Nephrology plans to repeat hemodialysis 5/22 without filtration.  5/23: Transfer to floor.  Psychiatry consult.  Foley DC   Assessment and Plan: * AKI (acute kidney injury) ([place]) Unclear etiology.  Possibly prerenal plus mild rhabdo?  Patient on a strong amount of opiates and benzos and it is possible, that she may have taken some illicit substances although this is unknown.  ESR significantly elevated and work-up in progress.  Nephrology following.  Status post dialysis catheter placement and first hemodialysis session done 5/20.  Another incomplete session on 5/22.  No dialysis today.  Further  dialysis per nephrology team.  Acute metabolic encephalopathy Secondary to uremia brought on by renal failure and or drugs of abuse and or withdrawal.  Patient lives alone and we do not have a baseline or a timeframe for when her mentation began to change.  Psych consult  Rhabdomyolysis Mild.  Improving with IV fluids.  CK continues to trend downward 246> 129.  Essential hypertension Tachycardic and hypertensive.  We will switch Norvasc to metoprolol 25 mg p.o. twice daily  UTI (urinary tract infection) Urine cultures with no growth.  Treated with IV Rocephin, stopped antibiotics.  A contributing factor, but certainly not the main cause of her renal injury.  Sepsis has been ruled out.  Chronic pain Patient on significant amount of opiate medications.  Unclear if she has been abusing these medications.  Urine drug screen positive for opiates and benzos, which she has prescriptions for, but tricyclic antidepressants also on drug screen.  Changed opiates to as needed only.  Monitoring very closely for withdrawal.  Psychiatry consult  Abnormal finding of lung CT scan of C-spine noted incidental finding of opacity in left upper lobe.  Could be infection versus inflammatory.  CT scan of chest to follow when patient is more lucid and can lie still.  Incidentally, chest x-ray done on 5/21 (because patient had been pulling at her dialysis catheter line) noted significant improvement in infiltrate  Anxiety As needed Xanax.  Monitoring for withdrawal.  Psych consult  Dyslipidemia - continue statin therapy.  Overweight (BMI 25.0-29.9) Meets criteria BMI greater than 25.        Subjective:  Sleepy but resting quietly.  Was agitated last evening per nursing documentation.  Physical Exam: Vitals:   12/03/21 1000 12/03/21 1200 12/03/21 1400 12/03/21 1600  BP: (!) 151/86 (!) 150/90 (!) 157/88 (!) 156/84  Pulse: (!) 102 (!) 105 (!) 109 (!) 108  Resp: 19 19 20 12   Temp:  98.5 F (36.9 C)  98.6  F (37 C)  TempSrc:  Axillary  Axillary  SpO2: 94% 91% 98% 94%  Weight:      Height:       66 year old female lying in the bed comfortably without any acute distress Lungs clear to auscultation bilaterally Cardiovascular regular rate and rhythm Abdomen soft, benign Neuro Sleepy, nonfocal Dialysis access: Left IJ temporary cath  Data Reviewed:  Potassium 3.4  Family Communication: None  Disposition: Status is: Inpatient Remains inpatient appropriate because: AKI, received 2 dialysis session.  Further inpatient dialysis need depending on nephrology decision   Planned Discharge Destination: Home    DVT prophylaxis-subcu heparin Time spent: 35 minutes  Author: Max Sane, MD 12/03/2021 4:50 PM  For on call review [email or web address].

## 2021-12-03 NOTE — Progress Notes (Signed)
Patient eager to eat lunch. <25% eaten. Mittens removed by this RN. This RN assisted patient with set up and eating. Patient able to maneuver straw to mouth while holding cup. Unable to coordinate utensil holding at this time. Bilaterally equal moderate hand grip.  Patient is alert to self and year, states [place]. Reoriented without success. Patient states she wants to rest at this time. Mittens remain off patient at this time with close observation from this RN to prevent self harm. Lines and tubing covered and hid at this time. Will continue to assess frequently.

## 2021-12-03 NOTE — Progress Notes (Signed)
Patient refused PO potassium and metoprolol.  States that she does not need any medicine and firmly closed her mouth.  MD notified.

## 2021-12-03 NOTE — Consult Note (Signed)
Attempted consult on this patient. She is sleeping. Attempted to arouse her several times by calling her name loudly. She opened eyes briefly, mumbled and went back to sleep. Will make another attempt tomorrow.   Sherlon Handing, PMHNP-BC

## 2021-12-04 DIAGNOSIS — F05 Delirium due to known physiological condition: Secondary | ICD-10-CM | POA: Diagnosis not present

## 2021-12-04 DIAGNOSIS — N179 Acute kidney failure, unspecified: Secondary | ICD-10-CM | POA: Diagnosis not present

## 2021-12-04 LAB — BASIC METABOLIC PANEL
Anion gap: 13 (ref 5–15)
BUN: 48 mg/dL — ABNORMAL HIGH (ref 8–23)
CO2: 24 mmol/L (ref 22–32)
Calcium: 8.6 mg/dL — ABNORMAL LOW (ref 8.9–10.3)
Chloride: 103 mmol/L (ref 98–111)
Creatinine, Ser: 5.29 mg/dL — ABNORMAL HIGH (ref 0.44–1.00)
GFR, Estimated: 8 mL/min — ABNORMAL LOW (ref >60)
Glucose, Bld: 98 mg/dL (ref 70–99)
Potassium: 3.2 mmol/L — ABNORMAL LOW (ref 3.5–5.1)
Sodium: 140 mmol/L (ref 135–145)

## 2021-12-04 LAB — CBC
HCT: 25.3 % — ABNORMAL LOW (ref 36.0–46.0)
Hemoglobin: 8.7 g/dL — ABNORMAL LOW (ref 12.0–15.0)
MCH: 28.4 pg (ref 26.0–34.0)
MCHC: 34.4 g/dL (ref 30.0–36.0)
MCV: 82.7 fL (ref 80.0–100.0)
Platelets: 117 10*3/uL — ABNORMAL LOW (ref 150–400)
RBC: 3.06 MIL/uL — ABNORMAL LOW (ref 3.87–5.11)
RDW: 14.2 % (ref 11.5–15.5)
WBC: 11.2 10*3/uL — ABNORMAL HIGH (ref 4.0–10.5)
nRBC: 0 % (ref 0.0–0.2)

## 2021-12-04 MED ORDER — POTASSIUM CHLORIDE 10 MEQ/100ML IV SOLN
10.0000 meq | INTRAVENOUS | Status: AC
  Administered 2021-12-04 (×2): 10 meq via INTRAVENOUS
  Filled 2021-12-04 (×2): qty 100

## 2021-12-04 MED ORDER — METOPROLOL TARTRATE 5 MG/5ML IV SOLN
2.5000 mg | Freq: Four times a day (QID) | INTRAVENOUS | Status: DC
  Administered 2021-12-04 – 2021-12-05 (×6): 2.5 mg via INTRAVENOUS
  Filled 2021-12-04 (×6): qty 5

## 2021-12-04 NOTE — Consult Note (Signed)
[facility] Consult   Reason for Consult:  delirium Referring Physician:  [facility] Patient Identification: Carla Gomez MRN:  [id number] Principal Diagnosis: Delirium due to another medical condition Diagnosis:  Principal Problem:   Delirium due to another medical condition Active Problems:   AKI (acute kidney injury) ([place])   Rhabdomyolysis   Essential hypertension   Dyslipidemia   Anxiety   Acute metabolic encephalopathy   Overweight (BMI 25.0-29.9)   Abnormal finding of lung   Chronic pain   UTI (urinary tract infection)   Total Time spent with patient: 45 minutes  Subjective:   Carla Gomez is a 66 y.o. female patient admitted with acute problems as above.  HPI:  Patient was brought into the ED being EMS after her neighbors know that she was shouting confused.  She has been treated for her medical problems as listed above and psychiatry was consulted.  Writer attempted to make several visits to patient for evaluation yesterday and this morning, however she was unable to be alert enough for consult.  Patient was seen again today at 1730.  She was alert enough to make eye contact, but this was fleeting.  She was unable to tell me her name but was able to repeat her name back to me when I asked if she was Ms. [name].  She was not able to give responses to place, time, situation.  When asked if she had ever seen a psychiatrist she said "of course."  When asked if she had been on any psychiatric medications she replied "probably so."  She gave short, nonsensical answers to some questions.  Patient was noted to have some very short twitching movements of the left arm and right foot intermittently.  She was also noted to mumble at times and smile. Patient is unable to participate in thorough evaluation.  Patient appears to be experiencing delirium.  Will follow up tomorrow.  Past Psychiatric History: Unknown  Risk to Self:   Risk to Others:   Prior Inpatient  Therapy:   Prior Outpatient Therapy:    Past Medical History:  Past Medical History:  Diagnosis Date   Cancer ([place])    basal cell   Chronic fatigue    Chronic pain    Dental bridge present    permanent bottom right   Fibromyalgia    Hypertension    Migraine headache     Past Surgical History:  Procedure Laterality Date   ABDOMINAL HYSTERECTOMY     BREAST BIOPSY Left 09/26/14   lt bx/clip- benign   BREAST BIOPSY Left 11/02/14   benign   COLONOSCOPY WITH PROPOFOL N/A 12/21/2020   Procedure: COLONOSCOPY WITH PROPOFOL;  Surgeon: Lucilla Lame, MD;  Location: [facility];  Service: Endoscopy;  Laterality: N/A;   NASAL SINUS SURGERY     Family History:  Family History  Problem Relation Age of Onset   Breast cancer Maternal Aunt        mat great aunt   Family Psychiatric  History: Unknown Social History:  Social History   Substance and Sexual Activity  Alcohol Use Never     Social History   Substance and Sexual Activity  Drug Use Never    Social History   Socioeconomic History   Marital status: Divorced    Spouse name: Not on file   Number of children: Not on file   Years of education: Not on file   Highest education level: Not on file  Occupational History  Not on file  Tobacco Use   Smoking status: Never   Smokeless tobacco: Never  Vaping Use   Vaping Use: Never used  Substance and Sexual Activity   Alcohol use: Never   Drug use: Never   Sexual activity: Not Currently  Other Topics Concern   Not on file  Social History Narrative   Not on file   Social Determinants of Health   Financial Resource Strain: Not on file  Food Insecurity: Not on file  Transportation Needs: Not on file  Physical Activity: Not on file  Stress: Not on file  Social Connections: Not on file   Additional Social History:    Allergies:   Allergies  Allergen Reactions   Citrus     Triggers migraines   Grass Pollen(K-O-[id number]-T-Swt Vern)    Latex     Redness around  mouth after dental procedures    Labs:  Results for orders placed or performed during the hospital encounter of 11/29/21 (from the past 48 hour(s))  CK     Status: None   Collection Time: 12/03/21  3:27 AM  Result Value Ref Range   Total CK 129 38 - 234 U/L    Comment: Performed at [facility], [address] [place], [place] [address]  Basic metabolic panel     Status: Abnormal   Collection Time: 12/03/21  3:27 AM  Result Value Ref Range   Sodium 136 135 - 145 mmol/L   Potassium 3.4 (L) 3.5 - 5.1 mmol/L   Chloride 101 98 - 111 mmol/L   CO2 22 22 - 32 mmol/L   Glucose, Bld 99 70 - 99 mg/dL    Comment: Glucose reference range applies only to samples taken after fasting for at least 8 hours.   BUN 39 (H) 8 - 23 mg/dL   Creatinine, Ser 4.82 (H) 0.44 - 1.00 mg/dL   Calcium 8.2 (L) 8.9 - 10.3 mg/dL   GFR, Estimated 9 (L) >60 mL/min    Comment: (NOTE) Calculated using the CKD-EPI Creatinine Equation (2021)    Anion gap 13 5 - 15    Comment: Performed at [facility], [address] [place], [place] [address]  Basic metabolic panel     Status: Abnormal   Collection Time: 12/04/21  4:38 AM  Result Value Ref Range   Sodium 140 135 - 145 mmol/L   Potassium 3.2 (L) 3.5 - 5.1 mmol/L   Chloride 103 98 - 111 mmol/L   CO2 24 22 - 32 mmol/L   Glucose, Bld 98 70 - 99 mg/dL    Comment: Glucose reference range applies only to samples taken after fasting for at least 8 hours.   BUN 48 (H) 8 - 23 mg/dL   Creatinine, Ser 5.29 (H) 0.44 - 1.00 mg/dL   Calcium 8.6 (L) 8.9 - 10.3 mg/dL   GFR, Estimated 8 (L) >60 mL/min    Comment: (NOTE) Calculated using the CKD-EPI Creatinine Equation (2021)    Anion gap 13 5 - 15    Comment: Performed at [facility], [address] [place], [place] [address]  CBC     Status: Abnormal   Collection Time: 12/04/21  4:38 AM  Result Value Ref Range   WBC 11.2 (H) 4.0 - 10.5 K/uL   RBC 3.06 (L) 3.87 - 5.11 MIL/uL   Hemoglobin  8.7 (L) 12.0 - 15.0 g/dL   HCT 25.3 (L) 36.0 - 46.0 %   MCV 82.7 80.0 - 100.0 fL  MCH 28.4 26.0 - 34.0 pg   MCHC 34.4 30.0 - 36.0 g/dL   RDW 14.2 11.5 - 15.5 %   Platelets 117 (L) 150 - 400 K/uL    Comment: Immature Platelet Fraction may be clinically indicated, consider ordering this additional test [id number]    nRBC 0.0 0.0 - 0.2 %    Comment: Performed at [facility], [address] [place], [place] [address]    Current Facility-Administered Medications  Medication Dose Route Frequency Provider Last Rate Last Admin   0.9 %  sodium chloride infusion   Intravenous Continuous Colon Flattery, NP 50 mL/hr at 12/04/21 1451 New Bag at 12/04/21 1451   acetaminophen (TYLENOL) tablet 650 mg  650 mg Oral Q6H PRN Mansy, Jan A, MD       Or   acetaminophen (TYLENOL) suppository 650 mg  650 mg Rectal Q6H PRN Mansy, Arvella Merles, MD       ALPRAZolam Duanne Moron) tablet 0.5 mg  0.5 mg Oral Q6H PRN Annita Brod, MD   0.5 mg at 12/03/21 2108   Chlorhexidine Gluconate Cloth 2 % PADS 6 each  6 each Topical Q0600 Lyla Son, MD   6 each at 12/01/21 0643   Chlorhexidine Gluconate Cloth 2 % PADS 6 each  6 each Topical Q0600 Annita Brod, MD   6 each at 12/04/21 0604   cholecalciferol (VITAMIN D3) tablet   Oral Daily Mansy, Jan A, MD   1,000 Units at 12/01/21 0936   heparin injection 5,000 Units  5,000 Units Subcutaneous Q8H Mansy, Jan A, MD   5,000 Units at 12/04/21 1448   HYDROcodone-acetaminophen (NORCO) 10-325 MG per tablet 1 tablet  1 tablet Oral Q6H PRN Annita Brod, MD       magnesium hydroxide (MILK OF MAGNESIA) suspension 30 mL  30 mL Oral Daily PRN Mansy, Jan A, MD       metoprolol tartrate (LOPRESSOR) injection 2.5 mg  2.5 mg Intravenous Q6H Kc, Ramesh, MD   2.5 mg at 12/04/21 1744   multivitamin with minerals tablet 1 tablet  1 tablet Oral Daily Mansy, Jan A, MD   1 tablet at 11/30/21 0937   ondansetron (ZOFRAN) tablet 4 mg  4 mg Oral Q6H PRN Mansy, Jan A, MD       Or    ondansetron [facility]) injection 4 mg  4 mg Intravenous Q6H PRN Mansy, Jan A, MD       SUMAtriptan (IMITREX) tablet 100 mg  100 mg Oral Daily PRN Mansy, Jan A, MD       traZODone (DESYREL) tablet 25 mg  25 mg Oral QHS PRN Mansy, Jan A, MD   25 mg at 12/02/21 2221    Musculoskeletal: Strength & Muscle Tone: decreased Gait & Station:  did not assess Patient leans: N/A   Psychiatric Specialty Exam:  Presentation  General Appearance: Appropriate for Environment Eye Contact:Fleeting; Minimal Speech:Garbled Speech Volume:Decreased Handedness:No data recorded  Mood and Affect  Mood:Euthymic Affect:Flat  Thought Process  Thought Processes:Disorganized Descriptions of Associations:Loose  Orientation:Partial  Thought Content:Illogical  History of Schizophrenia/Schizoaffective disorder:No data recorded Duration of Psychotic Symptoms:No data recorded Hallucinations:Hallucinations: -- Pincus Badder)  Ideas of Reference:-- Pincus Badder)  Suicidal Thoughts:Suicidal Thoughts: -- Pincus Badder)  Homicidal Thoughts:Homicidal Thoughts: -- Pincus Badder)   Sensorium  Memory:Immediate Poor Judgment:-- Pincus Badder) Insight:-- Pincus Badder)  Executive Functions  Concentration:Poor Attention Span:Poor [place] of Knowledge:Poor Language:Poor  Psychomotor Activity  Psychomotor Activity:No data recorded  Assets  Assets:No data recorded  Sleep  Sleep:No data recorded  Physical Exam: Physical Exam Vitals and nursing note reviewed.  HENT:     Head: Normocephalic.     Nose: No congestion or rhinorrhea.  Eyes:     General:        Right eye: No discharge.        Left eye: No discharge.  Pulmonary:     Effort: Pulmonary effort is normal.  Musculoskeletal:        General: Normal range of motion.  Skin:    General: Skin is dry.   Review of Systems  Reason unable to perform ROS: Pt hospitalized with delirium.  Blood pressure (!) 162/94, pulse (!) 104, temperature 98.1 F (36.7 C), resp. rate 19, height '5\' 4"'$   (1.626 m), weight 78.5 kg, SpO2 96 %. Body mass index is 29.71 kg/m.  Treatment Plan Summary: Daily contact with patient to assess and evaluate symptoms and progress in treatment  Disposition:  unable to determine at this time  Sherlon Handing, NP 12/04/2021 6:43 PM

## 2021-12-04 NOTE — Progress Notes (Signed)
[facility]  PROGRESS NOTE   Subjective:   Patient seen sitting up in bed. Eyes closed and mumbles when names is called Unable to respond questions  Creatinine 5.29 BUN 48 (39) UOP 1L on dayshift yesterday  Objective:  Vital signs: Blood pressure (!) 156/95, pulse (!) 107, temperature 97.6 F (36.4 C), resp. rate 18, height '5\' 4"'$  (1.626 m), weight 78.5 kg, SpO2 95 %.  Intake/Output Summary (Last 24 hours) at 12/04/2021 1153 Last data filed at 12/04/2021 1012 Gross per 24 hour  Intake 420.51 ml  Output 550 ml  Net -129.49 ml    Filed Weights   11/30/21 2212 12/02/21 1321 12/02/21 1637  Weight: 76.4 kg 78.5 kg 78.5 kg     Physical Exam: General:  No acute distress  Head:  Normocephalic, atraumatic. Moist oral mucosal membranes  Eyes:  Anicteric  Lungs:   Clear to auscultation, normal effort  Heart:  S1S2 no rubs  Abdomen:   Soft, nontender, bowel sounds present  Extremities:  1+ peripheral edema.  Neurologic:  Nonfocal, following commands  Skin:  No lesions  Access: Lt IJ temp cath    Basic Metabolic Panel: Recent Labs  Lab 11/30/21 1300 11/30/21 1817 12/01/21 0511 12/02/21 0130 12/03/21 0327 12/04/21 0438  NA 121* 123* 133* 131* 136 140  K 4.8 5.1 4.0 4.0 3.4* 3.2*  CL 85* 88* 95* 97* 101 103  CO2 15* 15* 20* 16* 22 24  GLUCOSE 111* 99 88 94 99 98  BUN 130* 125* 67* 76* 39* 48*  CREATININE 15.21* 15.49* 9.01* 9.01* 4.82* 5.29*  CALCIUM 8.0* 8.2* 8.2* 8.4* 8.2* 8.6*  PHOS 11.0* 10.8*  --   --   --   --      CBC: Recent Labs  Lab 11/29/21 1832 11/30/21 0423 11/30/21 1817 12/01/21 0511 12/04/21 0438  WBC 8.9 8.3 9.7 9.7 11.2*  HGB 9.9* 9.0* 9.0* 9.3* 8.7*  HCT 28.4* 25.4* 25.8* 26.6* 25.3*  MCV 82.6 82.5 82.7 81.3 82.7  PLT 155 143* 134* 140* 117*      Urinalysis: No results for input(s): COLORURINE, LABSPEC, PHURINE, GLUCOSEU, HGBUR, BILIRUBINUR, KETONESUR, PROTEINUR, UROBILINOGEN, NITRITE, LEUKOCYTESUR in the last 72  hours.  Invalid input(s): APPERANCEUR     Imaging: No results found.   Medications:    sodium chloride 50 mL/hr at 12/03/21 2120   potassium chloride 10 mEq (12/04/21 1149)    Chlorhexidine Gluconate Cloth  6 each Topical Q0600   Chlorhexidine Gluconate Cloth  6 each Topical Q0600   cholecalciferol   Oral Daily   heparin  5,000 Units Subcutaneous Q8H   metoprolol tartrate  2.5 mg Intravenous Q6H   multivitamin with minerals  1 tablet Oral Daily    Assessment/ Plan:     66 y.o. female with a PMHx of hypertension, hyperlipidemia, fibromyalgia, chronic pain syndrome and migraine headaches now admitted via the emergency room yesterday with history of mental status changes.  She was found to have a creatinine of 16.2 with a BUN of 123.  She also has a sodium of 123 yesterday.  Her CPK level on admission was 1375.  She was started on IV fluid resuscitation with minimal improvement of urine output.She had a renal sonogram done in the emergency room which was negative.  She was dialyzed last night with improvement in her mentation this morning.   Principal Problem:   AKI (acute kidney injury) ([place]) Active Problems:   Rhabdomyolysis   Essential hypertension   Dyslipidemia   Anxiety  Acute metabolic encephalopathy   Overweight (BMI 25.0-29.9)   Abnormal finding of lung   #1: Acute kidney injury: Patient has acute kidney injury with metabolic acidosis and hyponatremia with decreased urine output.   Patient received last dialysis treatment on Monday. Dialysis held to determine renal recovery. Urine output 1L on dayshift yesterday. Creatinine and BUN rise slowly. Patient may require dialysis tomorrow, Will monitor. Continue gentle hydration with prescribed IVF.     #2: Hyponatremia: Hyponatremia most likely secondary to decreased p.o. intake.  Improved over weekend. Will continue to improve with dialysis and oral nutrition.   Sodium corrected    #3: Acute Metabolic acidosis:  Metabolic acidosis may be secondary to renal failure complicated by possible infection.    Bicarb stable   #4: Rhabdomyolysis: resolved with IV fluids.     #5: Hypertension: Metoprolol daily.  Blood pressure 156/95    LOS: [facility] 5/24/202311:53 AM

## 2021-12-04 NOTE — Progress Notes (Signed)
PROGRESS NOTE Carla Gomez  HUD:[id number] DOB: 10-01-1955 DOA: 11/29/2021 PCP: Albina Billet, MD   Brief Narrative/Hospital Course: 66 year old female with past medical history of fibromyalgia, hypertension and chronic pain who presented to the emergency room on 5/19 with altered mental status.  Patient was brought in by EMS after her neighbors noted she was shouting and confused.  In the emergency room, patient found to have urinary tract infection, sodium of 123 and creatinine of 16.28 with a BUN of 123.  Last documented labs on this patient for from 2017 noting normal renal function at that time.  CK level at 1375.  Renal ultrasound done noted some chronic renal disease, but no atrophy or hydronephrosis.  Patient was admitted to the hospital service and started on IV fluids.  Nephrology was consulted.  Although labs on 5/20 morning noted mildly improving creatinine with some mild urine output, follow-up labs that afternoon noted no urine output and worsening creatinine despite IV fluids.  Also developing myoclonus.  Patient transferred to stepdown and dialysis catheter placed and patient underwent hemodialysis.  By 5/21, patient still confused, but a bit more awake and alert.  She has since continued to have good urine output, but by 5/22 more drowsy and BUN rising.  Nephrology plans to repeat hemodialysis 5/22 without filtration.  5/23: Transferred to floor Psychiatry consulted and foley Dc'ed 5/24- creat up 4.8> 5.2, k 3.2 refusing k dur.    Subjective: Seen and examined this morning.  She is more alert awake per nursing staff able to tell me her name unable to answer rest of the questions. Oral mucosa appears dry. Psychiatry unable to assess yesterday, patient refused oral potassium and metoprolol yesterday Overnight afebrile   Assessment and Plan: Principal Problem:   AKI (acute kidney injury) ([place]) Active Problems:   Acute metabolic encephalopathy   Rhabdomyolysis   Essential  hypertension   UTI (urinary tract infection)   Abnormal finding of lung   Chronic pain   Anxiety   Dyslipidemia   Overweight (BMI 25.0-29.9)   Assessment and Plan:  AKI with metabolic acidosis hyponatremia decreased urine output: Unclear etiology. Possibly prerenal plus mild rhabdo?  Patient on a strong amount of opiates and benzos and it is possible, that she may have taken some illicit substances although this is unknown.  ESR significantly elevated and work-up in progress.  Followed by nephrology on board had HD 5/20, 5/22, creatinine uptrending-await further nephrology plan RE:HD. Recent Labs  Lab 11/30/21 1817 12/01/21 0511 12/02/21 0130 12/03/21 0327 12/04/21 0438  BUN 125* 67* 76* 39* 48*  CREATININE 15.49* 9.01* 9.01* 4.82* 5.29*    Acute metabolic encephalopathy:Secondary to uremia brought on by renal failure and or drugs of abuse and or withdrawal.  Patient lives alone, unclear how her baseline is or what timeframe her mentation has been abnormal.  Psychiatry has been consulted.  See more alert and awake.  Hyponatremia-resolved Metabolic acidosis-resolved Rhabdomyolysis,Mild:resolved.   Essential hypertension: BP borderline controlled mild tachycardia.Norvasc switched to metoprolol 25 mg BID-but patient refusing  UTI: Received IV antibiotics discontinued as urine culture negative.Sepsis has been ruled out.  Chronic pain on chronic benzo and opiates significant amount: Unclear if patient has been with medication.Urine drug screen positive for opiates and benzos, which she has prescriptions for, but tricyclic antidepressants also on drug screen.  Changed opiates to as needed only.  Psych has been consulted  Abnormal finding of lung:CT scan of C-spine noted incidental finding of opacity in left upper lobe.  Could  be infection versus inflammatory.  Plan is for CT chest to follow when patient is more lucid and can lie still.  Incidentally, chest x-ray done on 5/21 (because  patient had been pulling at her dialysis catheter line) noted significant improvement in infiltrate.  Anxiety: Cont prn Xanax.  Watch for withdrawal, psych consulted  Dyslipidemia: cont statin Overweight with Body mass index is 29.71 kg/m.   DVT prophylaxis: heparin injection 5,000 Units Start: 11/29/21 2300 Code Status:   Code Status: Full Code Family Communication: plan of care discussed with patient/RN at bedside. Patient status is: Inpatient because of ongoing encephalopathy,  Level of care: Med-Surg   Dispo: The patient is from: home            Anticipated disposition: TBD  Mobility Assessment (last 72 hours)     Mobility Assessment   No documentation.            Objective: Vitals last 24 hrs: Vitals:   12/03/21 1733 12/03/21 1957 12/04/21 0613 12/04/21 0912  BP: (!) 110/99 (!) 157/92 (!) 151/87 (!) 156/95  Pulse: (!) 102 (!) 108 (!) 108 (!) 107  Resp: 20 17  18   Temp: 98.3 F (36.8 C) 98.2 F (36.8 C) 98.6 F (37 C) 97.6 F (36.4 C)  TempSrc:      SpO2: 96% 99% 98% 95%  Weight:      Height:       Weight change:   Physical Examination: General exam: Mild lethargic, oriented to self only, on room air  HEENT:Oral mucosa moist, Ear/Nose WNL grossly, dentition normal. Respiratory system: bilaterally diminished BS, no use of accessory muscle Cardiovascular system: S1 & S2 +, No JVD. Gastrointestinal system: Abdomen soft,NT,ND, BS+ Nervous System:Alert, awake, moving extremities and grossly nonfocal Extremities: LE edema ++ B/L ANKLE,distal peripheral pulses palpable.  Skin: No rashes,no icterus. MSK: Normal muscle bulk,tone, power  Medications reviewed:  Scheduled Meds:  Chlorhexidine Gluconate Cloth  6 each Topical Q0600   Chlorhexidine Gluconate Cloth  6 each Topical Q0600   cholecalciferol   Oral Daily   heparin  5,000 Units Subcutaneous Q8H   metoprolol tartrate  25 mg Oral BID   multivitamin with minerals  1 tablet Oral Daily   potassium  chloride  40 mEq Oral Once   Continuous Infusions:  sodium chloride 50 mL/hr at 12/03/21 2120      Diet Order             Diet Heart Room service appropriate? Yes; Fluid consistency: Thin  Diet effective now                            Intake/Output Summary (Last 24 hours) at 12/04/2021 1001 Last data filed at 12/03/2021 1425 Gross per 24 hour  Intake 842.27 ml  Output 725 ml  Net 117.27 ml   Net IO Since Admission: 6,901.02 mL [12/04/21 1001]  Wt Readings from Last 3 Encounters:  12/02/21 78.5 kg  12/25/20 70.8 kg  12/21/20 70.8 kg     Unresulted Labs (From admission, onward)     Start     Ordered   12/02/21 7416  Basic metabolic panel  Daily,   R     Question:  Specimen collection method  Answer:  Lab=Lab collect   12/01/21 1456   11/30/21 1248  Myoglobin, serum  ONCE - URGENT,   TIMED       Question:  Specimen collection method  Answer:  Lab=Lab collect  11/30/21 1247          Data Reviewed: I have personally reviewed following labs and imaging studies CBC: Recent Labs  Lab 11/29/21 1832 11/30/21 0423 11/30/21 1817 12/01/21 0511 12/04/21 0438  WBC 8.9 8.3 9.7 9.7 11.2*  HGB 9.9* 9.0* 9.0* 9.3* 8.7*  HCT 28.4* 25.4* 25.8* 26.6* 25.3*  MCV 82.6 82.5 82.7 81.3 82.7  PLT 155 143* 134* 140* 696*   Basic Metabolic Panel: Recent Labs  Lab 11/30/21 1300 11/30/21 1817 12/01/21 0511 12/02/21 0130 12/03/21 0327 12/04/21 0438  NA 121* 123* 133* 131* 136 140  K 4.8 5.1 4.0 4.0 3.4* 3.2*  CL 85* 88* 95* 97* 101 103  CO2 15* 15* 20* 16* 22 24  GLUCOSE 111* 99 88 94 99 98  BUN 130* 125* 67* 76* 39* 48*  CREATININE 15.21* 15.49* 9.01* 9.01* 4.82* 5.29*  CALCIUM 8.0* 8.2* 8.2* 8.4* 8.2* 8.6*  PHOS 11.0* 10.8*  --   --   --   --    GFR: Estimated Creatinine Clearance: 10.3 mL/min (A) (by C-G formula based on SCr of 5.29 mg/dL (H)). Liver Function Tests: Recent Labs  Lab 11/29/21 1832 11/30/21 1300 11/30/21 1817  AST 19 16  --   ALT 29 23   --   ALKPHOS 96 84  --   BILITOT 1.4* 1.1  --   PROT 7.7 6.9  --   ALBUMIN 3.7 3.3* 3.4*   No results for input(s): LIPASE, AMYLASE in the last 168 hours. No results for input(s): AMMONIA in the last 168 hours. Coagulation Profile: No results for input(s): INR, PROTIME in the last 168 hours. BNP (last 3 results) No results for input(s): PROBNP in the last 8760 hours. HbA1C: No results for input(s): HGBA1C in the last 72 hours. CBG: Recent Labs  Lab 11/29/21 1841 11/30/21 1627  GLUCAP 137* 102*   Lipid Profile: No results for input(s): CHOL, HDL, LDLCALC, TRIG, CHOLHDL, LDLDIRECT in the last 72 hours. Thyroid Function Tests: No results for input(s): TSH, T4TOTAL, FREET4, T3FREE, THYROIDAB in the last 72 hours. Sepsis Labs: Recent Labs  Lab 11/29/21 2034 11/29/21 2240  LATICACIDVEN 0.6 0.7    Recent Results (from the past 240 hour(s))  MRSA Next Gen by PCR, Nasal     Status: None   Collection Time: 11/30/21  4:34 PM   Specimen: Nasal Mucosa; Nasal Swab  Result Value Ref Range Status   MRSA by PCR Next Gen NOT DETECTED NOT DETECTED Final    Comment: (NOTE) The GeneXpert MRSA Assay (FDA approved for NASAL specimens only), is one component of a comprehensive MRSA colonization surveillance program. It is not intended to diagnose MRSA infection nor to guide or monitor treatment for MRSA infections. Test performance is not FDA approved in patients less than 39 years old. Performed at [facility], [address], [place], [place] [address]   Urine Culture     Status: None   Collection Time: 12/02/21 11:25 AM   Specimen: Urine, Clean Catch  Result Value Ref Range Status   Specimen Description   Final    URINE, CLEAN CATCH Performed at [facility], [address] [place], [place] [address]    Special Requests   Final    NONE Performed at [facility], [address] [place], [place] [address]    Culture   Final    NO  GROWTH Performed at [facility], [place] [address] [place], [place] [address]  Report Status 12/03/2021 FINAL  Final    Antimicrobials: Anti-infectives (From admission, onward)    Start     Dose/Rate Route Frequency Ordered Stop   11/30/21 1400  cefTRIAXone (ROCEPHIN) 1 g in sodium chloride 0.9 % 100 mL IVPB  Status:  Discontinued        1 g 200 mL/hr over 30 Minutes Intravenous Every 24 hours 11/30/21 1311 12/03/21 0932      Culture/Microbiology    Component Value Date/Time   SDES  12/02/2021 1125    URINE, CLEAN CATCH Performed at [facility], [address] [place], [place] [address]    [facility]  12/02/2021 1125    NONE Performed at [facility], [address], [place], [place] [address]    CULT  12/02/2021 1125    NO GROWTH Performed at [facility], [place] [address] [place], [place] [address]    REPTSTATUS 12/03/2021 FINAL 12/02/2021 1125    Other culture-see note  Radiology Studies: No results found.   LOS: 5 days   Antonieta Pert, MD [facility]  12/04/2021, 10:01 AM

## 2021-12-05 ENCOUNTER — Inpatient Hospital Stay (HOSPITAL_COMMUNITY)
Admission: AD | Admit: 2021-12-05 | Discharge: 2021-12-18 | DRG: 091 | Disposition: A | Discharge status: Skilled Nursing Facility | Payer: Medicare Other | Source: Other Acute Inpatient Hospital | Attending: Internal Medicine | Admitting: Internal Medicine

## 2021-12-05 DIAGNOSIS — R700 Elevated erythrocyte sedimentation rate: Secondary | ICD-10-CM

## 2021-12-05 DIAGNOSIS — D631 Anemia in chronic kidney disease: Secondary | ICD-10-CM | POA: Diagnosis present

## 2021-12-05 DIAGNOSIS — N189 Chronic kidney disease, unspecified: Secondary | ICD-10-CM | POA: Diagnosis present

## 2021-12-05 DIAGNOSIS — J9601 Acute respiratory failure with hypoxia: Secondary | ICD-10-CM | POA: Diagnosis present

## 2021-12-05 DIAGNOSIS — F419 Anxiety disorder, unspecified: Secondary | ICD-10-CM

## 2021-12-05 DIAGNOSIS — R569 Unspecified convulsions: Secondary | ICD-10-CM | POA: Diagnosis not present

## 2021-12-05 DIAGNOSIS — T402X5A Adverse effect of other opioids, initial encounter: Secondary | ICD-10-CM | POA: Diagnosis present

## 2021-12-05 DIAGNOSIS — Z808 Family history of malignant neoplasm of other organs or systems: Secondary | ICD-10-CM

## 2021-12-05 DIAGNOSIS — Z79899 Other long term (current) drug therapy: Secondary | ICD-10-CM

## 2021-12-05 DIAGNOSIS — D696 Thrombocytopenia, unspecified: Secondary | ICD-10-CM | POA: Diagnosis present

## 2021-12-05 DIAGNOSIS — R4020 Unspecified coma: Secondary | ICD-10-CM | POA: Diagnosis present

## 2021-12-05 DIAGNOSIS — I739 Peripheral vascular disease, unspecified: Secondary | ICD-10-CM | POA: Diagnosis present

## 2021-12-05 DIAGNOSIS — T494X5A Adverse effect of keratolytics, keratoplastics, and other hair treatment drugs and preparations, initial encounter: Secondary | ICD-10-CM | POA: Diagnosis present

## 2021-12-05 DIAGNOSIS — E785 Hyperlipidemia, unspecified: Secondary | ICD-10-CM | POA: Diagnosis not present

## 2021-12-05 DIAGNOSIS — I129 Hypertensive chronic kidney disease with stage 1 through stage 4 chronic kidney disease, or unspecified chronic kidney disease: Secondary | ICD-10-CM | POA: Diagnosis present

## 2021-12-05 DIAGNOSIS — G9341 Metabolic encephalopathy: Secondary | ICD-10-CM | POA: Diagnosis not present

## 2021-12-05 DIAGNOSIS — T424X5A Adverse effect of benzodiazepines, initial encounter: Secondary | ICD-10-CM | POA: Diagnosis present

## 2021-12-05 DIAGNOSIS — I10 Essential (primary) hypertension: Secondary | ICD-10-CM | POA: Diagnosis not present

## 2021-12-05 DIAGNOSIS — Z91018 Allergy to other foods: Secondary | ICD-10-CM

## 2021-12-05 DIAGNOSIS — E162 Hypoglycemia, unspecified: Secondary | ICD-10-CM | POA: Diagnosis not present

## 2021-12-05 DIAGNOSIS — E871 Hypo-osmolality and hyponatremia: Secondary | ICD-10-CM | POA: Diagnosis present

## 2021-12-05 DIAGNOSIS — J690 Pneumonitis due to inhalation of food and vomit: Secondary | ICD-10-CM

## 2021-12-05 DIAGNOSIS — G928 Other toxic encephalopathy: Secondary | ICD-10-CM | POA: Diagnosis present

## 2021-12-05 DIAGNOSIS — F13931 Sedative, hypnotic or anxiolytic use, unspecified with withdrawal delirium: Secondary | ICD-10-CM | POA: Diagnosis present

## 2021-12-05 DIAGNOSIS — E876 Hypokalemia: Secondary | ICD-10-CM | POA: Diagnosis present

## 2021-12-05 DIAGNOSIS — M6282 Rhabdomyolysis: Secondary | ICD-10-CM

## 2021-12-05 DIAGNOSIS — T481X5A Adverse effect of skeletal muscle relaxants [neuromuscular blocking agents], initial encounter: Secondary | ICD-10-CM | POA: Diagnosis present

## 2021-12-05 DIAGNOSIS — Z91040 Latex allergy status: Secondary | ICD-10-CM

## 2021-12-05 DIAGNOSIS — R739 Hyperglycemia, unspecified: Secondary | ICD-10-CM | POA: Diagnosis present

## 2021-12-05 DIAGNOSIS — N390 Urinary tract infection, site not specified: Secondary | ICD-10-CM | POA: Diagnosis present

## 2021-12-05 DIAGNOSIS — G43909 Migraine, unspecified, not intractable, without status migrainosus: Secondary | ICD-10-CM | POA: Diagnosis present

## 2021-12-05 DIAGNOSIS — Y92009 Unspecified place in unspecified non-institutional (private) residence as the place of occurrence of the external cause: Secondary | ICD-10-CM

## 2021-12-05 DIAGNOSIS — E669 Obesity, unspecified: Secondary | ICD-10-CM | POA: Diagnosis present

## 2021-12-05 DIAGNOSIS — Z803 Family history of malignant neoplasm of breast: Secondary | ICD-10-CM

## 2021-12-05 DIAGNOSIS — E663 Overweight: Secondary | ICD-10-CM | POA: Diagnosis not present

## 2021-12-05 DIAGNOSIS — G40901 Epilepsy, unspecified, not intractable, with status epilepticus: Secondary | ICD-10-CM | POA: Diagnosis present

## 2021-12-05 DIAGNOSIS — R4189 Other symptoms and signs involving cognitive functions and awareness: Secondary | ICD-10-CM | POA: Diagnosis present

## 2021-12-05 DIAGNOSIS — Z6830 Body mass index (BMI) 30.0-30.9, adult: Secondary | ICD-10-CM

## 2021-12-05 DIAGNOSIS — M797 Fibromyalgia: Secondary | ICD-10-CM | POA: Diagnosis present

## 2021-12-05 DIAGNOSIS — B839 Helminthiasis, unspecified: Secondary | ICD-10-CM

## 2021-12-05 DIAGNOSIS — G8929 Other chronic pain: Secondary | ICD-10-CM | POA: Diagnosis present

## 2021-12-05 DIAGNOSIS — T82838A Hemorrhage of vascular prosthetic devices, implants and grafts, initial encounter: Secondary | ICD-10-CM | POA: Diagnosis not present

## 2021-12-05 DIAGNOSIS — N179 Acute kidney failure, unspecified: Secondary | ICD-10-CM | POA: Diagnosis present

## 2021-12-05 DIAGNOSIS — D649 Anemia, unspecified: Secondary | ICD-10-CM

## 2021-12-05 DIAGNOSIS — E872 Acidosis, unspecified: Secondary | ICD-10-CM | POA: Diagnosis present

## 2021-12-05 DIAGNOSIS — Z79891 Long term (current) use of opiate analgesic: Secondary | ICD-10-CM

## 2021-12-05 DIAGNOSIS — Z90710 Acquired absence of both cervix and uterus: Secondary | ICD-10-CM

## 2021-12-05 DIAGNOSIS — Z6829 Body mass index (BMI) 29.0-29.9, adult: Secondary | ICD-10-CM

## 2021-12-05 DIAGNOSIS — G894 Chronic pain syndrome: Secondary | ICD-10-CM | POA: Diagnosis present

## 2021-12-05 DIAGNOSIS — N184 Chronic kidney disease, stage 4 (severe): Secondary | ICD-10-CM | POA: Diagnosis not present

## 2021-12-05 DIAGNOSIS — G929 Unspecified toxic encephalopathy: Secondary | ICD-10-CM

## 2021-12-05 DIAGNOSIS — G9340 Encephalopathy, unspecified: Secondary | ICD-10-CM | POA: Diagnosis not present

## 2021-12-05 LAB — BASIC METABOLIC PANEL
Anion gap: 16 — ABNORMAL HIGH (ref 5–15)
BUN: 58 mg/dL — ABNORMAL HIGH (ref 8–23)
CO2: 20 mmol/L — ABNORMAL LOW (ref 22–32)
Calcium: 8.6 mg/dL — ABNORMAL LOW (ref 8.9–10.3)
Chloride: 105 mmol/L (ref 98–111)
Creatinine, Ser: 5.35 mg/dL — ABNORMAL HIGH (ref 0.44–1.00)
GFR, Estimated: 8 mL/min — ABNORMAL LOW (ref >60)
Glucose, Bld: 101 mg/dL — ABNORMAL HIGH (ref 70–99)
Potassium: 3.4 mmol/L — ABNORMAL LOW (ref 3.5–5.1)
Sodium: 141 mmol/L (ref 135–145)

## 2021-12-05 LAB — GLUCOSE, CAPILLARY: Glucose-Capillary: 104 mg/dL — ABNORMAL HIGH (ref 70–99)

## 2021-12-05 MED ORDER — LEVOFLOXACIN IN D5W 750 MG/150ML IV SOLN: 750.0000 mg | Freq: Once | INTRAVENOUS | Status: DC

## 2021-12-05 MED ORDER — LORAZEPAM 2 MG/ML IJ SOLN
2.0000 mg | Freq: Once | INTRAMUSCULAR | Status: AC
  Administered 2021-12-05: 2 mg via INTRAVENOUS
  Filled 2021-12-05: qty 1

## 2021-12-05 MED ORDER — LEVETIRACETAM IN NACL 1500 MG/100ML IV SOLN
1500.0000 mg | Freq: Once | INTRAVENOUS | Status: AC
  Administered 2021-12-05: 1500 mg via INTRAVENOUS
  Filled 2021-12-05: qty 100

## 2021-12-05 MED ORDER — LEVOFLOXACIN IN D5W 500 MG/100ML IV SOLN: 500.0000 mg | INTRAVENOUS | Status: DC

## 2021-12-05 MED ORDER — ONDANSETRON HCL 4 MG/2ML IJ SOLN: 4.0000 mg | Freq: Four times a day (QID) | INTRAMUSCULAR | 0 refills | Status: DC | PRN

## 2021-12-05 MED ORDER — LORAZEPAM 2 MG/ML IJ SOLN: 1.0000 mg | INTRAMUSCULAR | Status: DC | PRN

## 2021-12-05 MED ORDER — HEPARIN SODIUM (PORCINE) 1000 UNIT/ML IJ SOLN
INTRAMUSCULAR | Status: AC
  Filled 2021-12-05: qty 10

## 2021-12-05 MED ORDER — SACCHAROMYCES BOULARDII 250 MG PO CAPS
250.0000 mg | ORAL_CAPSULE | Freq: Two times a day (BID) | ORAL | Status: DC
  Filled 2021-12-05: qty 1

## 2021-12-05 MED ORDER — LEVETIRACETAM IN NACL 500 MG/100ML IV SOLN
500.0000 mg | INTRAVENOUS | Status: DC
Start: 2021-12-06 — End: 2021-12-05

## 2021-12-05 MED ORDER — ACETAMINOPHEN 650 MG RE SUPP: 650.0000 mg | RECTAL | Status: DC | PRN

## 2021-12-05 MED ORDER — SODIUM CHLORIDE 0.9 % IV SOLN
2.0000 g | INTRAVENOUS | Status: DC
  Filled 2021-12-05: qty 20

## 2021-12-05 MED ORDER — SODIUM CHLORIDE 0.9 % IV SOLN: 3.0000 g | INTRAVENOUS | Status: DC

## 2021-12-05 MED ORDER — SODIUM CHLORIDE 0.9 % IV SOLN
3.0000 g | INTRAVENOUS | Status: DC
  Administered 2021-12-06: 3 g via INTRAVENOUS
  Filled 2021-12-05: qty 8

## 2021-12-05 MED ORDER — SODIUM CHLORIDE 0.9 % IV SOLN: INTRAVENOUS | Status: DC

## 2021-12-05 MED ORDER — ACETAMINOPHEN 325 MG PO TABS
650.0000 mg | ORAL_TABLET | Freq: Four times a day (QID) | ORAL | Status: AC | PRN
Start: 2021-12-05 — End: ?

## 2021-12-05 MED ORDER — METOPROLOL TARTRATE 5 MG/5ML IV SOLN
2.5000 mg | Freq: Four times a day (QID) | INTRAVENOUS | Status: DC
  Administered 2021-12-06 – 2021-12-07 (×8): 2.5 mg via INTRAVENOUS
  Filled 2021-12-05 (×8): qty 5

## 2021-12-05 MED ORDER — HEPARIN SODIUM (PORCINE) 5000 UNIT/ML IJ SOLN: 5000.0000 [IU] | Freq: Three times a day (TID) | INTRAMUSCULAR | Status: DC

## 2021-12-05 MED ORDER — SODIUM CHLORIDE 0.9 % IV SOLN
3.0000 g | INTRAVENOUS | Status: DC
  Administered 2021-12-05: 3 g via INTRAVENOUS
  Filled 2021-12-05: qty 8

## 2021-12-05 MED ORDER — METOPROLOL TARTRATE 5 MG/5ML IV SOLN: 2.5000 mg | Freq: Four times a day (QID) | INTRAVENOUS | Status: DC

## 2021-12-05 MED ORDER — SODIUM CHLORIDE 0.9 % IV SOLN: 50.0000 mL | INTRAVENOUS | 0 refills | Status: DC

## 2021-12-05 MED ORDER — SODIUM CHLORIDE 0.9 % IV SOLN
50.0000 mL | INTRAVENOUS | Status: AC
  Administered 2021-12-06: 50 mL via INTRAVENOUS

## 2021-12-05 MED ORDER — LEVETIRACETAM IN NACL 500 MG/100ML IV SOLN: 500.0000 mg | INTRAVENOUS | Status: DC

## 2021-12-05 MED ORDER — SODIUM CHLORIDE 0.9 % IV SOLN
500.0000 mg | INTRAVENOUS | Status: DC
  Administered 2021-12-05: 500 mg via INTRAVENOUS
  Filled 2021-12-05: qty 5

## 2021-12-05 MED ORDER — LEVOFLOXACIN IN D5W 750 MG/150ML IV SOLN
750.0000 mg | Freq: Once | INTRAVENOUS | Status: DC
  Filled 2021-12-05: qty 150

## 2021-12-05 MED ORDER — LEVETIRACETAM IN NACL 500 MG/100ML IV SOLN
500.0000 mg | INTRAVENOUS | Status: DC
  Administered 2021-12-06 – 2021-12-10 (×5): 500 mg via INTRAVENOUS
  Filled 2021-12-05 (×5): qty 100

## 2021-12-05 MED ORDER — ONDANSETRON HCL 4 MG/2ML IJ SOLN: 4.0000 mg | Freq: Four times a day (QID) | INTRAMUSCULAR | Status: DC | PRN

## 2021-12-05 NOTE — Assessment & Plan Note (Addendum)
Only on Xanax as an outpatient, no maintenance medication I can see. - Continue clonazepam for now, plan for future taper

## 2021-12-05 NOTE — Progress Notes (Signed)
Progress Note   Patient: Carla Gomez QMG:[id number] DOB: [birth year] DOA: 11/29/2021     6 DOS: the patient was seen and examined on 12/05/2021   Brief hospital course: 66 year old female with past medical history of fibromyalgia, hypertension and chronic pain who presented to the emergency room on 5/19 with altered mental status.  Patient was brought in by EMS after her neighbors noted she was shouting and confused.  In the emergency room, patient found to have urinary tract infection, sodium of 123 and creatinine of 16.28 with a BUN of 123.  Last documented labs on this patient for from 2017 noting normal renal function at that time.  CK level at 1375.  Renal ultrasound done noted some chronic renal disease, but no atrophy or hydronephrosis.  Patient was admitted to the hospital service and started on IV fluids.  Nephrology was consulted.  Although labs on 5/20 morning noted mildly improving creatinine with some mild urine output, follow-up labs that afternoon noted no urine output and worsening creatinine despite IV fluids.  Also developing myoclonus.  Patient transferred to stepdown and dialysis catheter placed and patient underwent hemodialysis.  By 5/21, patient still confused, but a bit more awake and alert.  She has since continued to have good urine output, but by 5/22 more drowsy and BUN rising.  Nephrology plans to repeat hemodialysis 5/22 without filtration.  5/23: Transferred to floor Psychiatry consulted and foley Dc'ed 5/24- creat up 4.8> 5.2, k 3.2 refusing k dur.  Assessment and Plan: * Acute metabolic encephalopathy Patient's not really responsive today.  Its been 5 days in the hospital.  Likely secondary to uremia but has not improved since coming into the hospital.  We will get an EEG and neurology consultation.  Could potentially be withdrawal from pain medications.  We will send off volatile's.  AKI (acute kidney injury) ([place]) Creatinine 16.28 on presentation and down to  5.35 today.  Had dialysis session today.  Rhabdomyolysis Holding statin.  CPK last checked couple days ago and it was down to 129.  Essential hypertension Tachycardic and hypertensive.  Right now unable to take oral medications  Chronic pain Patient right now unable to take oral medications.  Anxiety Right now unable to take oral medications.  Dyslipidemia Discontinue statin  Overweight (BMI 25.0-29.9) Meets criteria BMI greater than 25.  Elevated sedimentation rate Not quite sure the etiology of this.  We will recheck tomorrow to see if it is trending better.  Aspiration pneumonia ([place]) Upper lobe pneumonia seen on initial testing.  Will start Unasyn and Zithromax and continue to monitor.        Subjective: Patient resisted me opening up her eyes and opening up her mouth.  Came in with altered mental status and found to be in acute kidney injury with rhabdomyolysis.  Patient currently having quite a bit of twitching.  Physical Exam: Vitals:   12/05/21 1245 12/05/21 1300 12/05/21 1312 12/05/21 1350  BP: (!) 157/96 (!) 150/96  (!) 149/88  Pulse:      Resp: (!) 29 (!) 28 (!) 26 20  Temp:    98.7 F (37.1 C)  TempSrc:    Axillary  SpO2:    91%  Weight:   76.7 kg   Height:       Physical Exam HENT:     Head: Normocephalic.     Mouth/Throat:     Comments: Unable to look into mouth. Eyes:     General: Lids are normal.  Comments: Patient resisted me opening up her eyes.  Cardiovascular:     Rate and Rhythm: Regular rhythm. Tachycardia present.     Heart sounds: Normal heart sounds, S1 normal and S2 normal.  Pulmonary:     Breath sounds: Normal breath sounds. No decreased breath sounds, wheezing, rhonchi or rales.  Abdominal:     Palpations: Abdomen is soft.     Tenderness: There is no abdominal tenderness.  Musculoskeletal:     Right lower leg: Swelling present.     Left lower leg: Swelling present.  Skin:    General: Skin is warm.     Findings: No rash.   Neurological:     Mental Status: She is lethargic.     Comments: Twitching seen while is in the room.    Data Reviewed: Creatinine 5.35, potassium 3.4, anion gap 16  Family Communication: Mailbox is full for soon  Disposition: Status is: Inpatient Remains inpatient appropriate because: Still unresponsive. Planned Discharge Destination: To be determined     Author: Loletha Grayer, MD 12/05/2021 2:49 PM  For on call review [email or web address].

## 2021-12-05 NOTE — Progress Notes (Signed)
Pt was not responding to RN the whole day. MD was notified and EEG was ordered. RN was notified that pt is actively on seizure, Night shift RN was informed about the call and aware about the new doctor's order.

## 2021-12-05 NOTE — Subjective & Objective (Signed)
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  Patient was initially admitted to [facility] on 11/29/2021 due to altered mental status.  At that time she was noted to have acute kidney injury with a serum creatinine of 16.28 and a BUN of 123.  Nephrology was consulted and patient started on hemodialysis.  Over the course the next 2 weeks, patient continued to have encephalopathy.  She was also treated for aspiration pneumonia.  She had an EEG performed on 12/03/2021 which was concerning for status epilepticus and neurology was consulted.  They recommended transfer to [facility] for continuous EEG monitoring.  She was given a dose of IV Ativan and IV Keppra.  Patient currently cannot give a history review of systems due to her altered mental status.

## 2021-12-05 NOTE — Progress Notes (Addendum)
Received patient per stretcher eyes closed but opens to voice, mumbling words, did not follow commands accompanied by [facility] staff. Paged on call neurology MD and [facility] admits. Cardiac tele connected to CCM. Seizure precaution initiated. Oxygen and suction set up in bed. Side rail pads were in place.

## 2021-12-05 NOTE — Assessment & Plan Note (Signed)
Right now unable to take oral medications.

## 2021-12-05 NOTE — Consult Note (Signed)
Psychiatry: Patient was unavailable for evaluation being in hemodialysis much of the day.  We will follow up tomorrow.

## 2021-12-05 NOTE — Assessment & Plan Note (Addendum)
BP improved - Continue amlodipine and carvedilol

## 2021-12-05 NOTE — Assessment & Plan Note (Signed)
Holding statin.  CPK last checked couple days ago and it was down to 129.

## 2021-12-05 NOTE — Progress Notes (Signed)
OT Cancellation Note  Patient Details Name: Carla Gomez MRN: [id number] DOB: [birth year]   Cancelled Treatment:    Reason Eval/Treat Not Completed: Other (comment) (Attempted to see pt, pt not responsive to name, brief eye opening even with max tactile and verbal cueing for arousal. RN in room to assess. OT will hold at this time.)  Shanon Payor, OTD OTR/L  12/05/21, 3:44 PM

## 2021-12-05 NOTE — Procedures (Signed)
Routine EEG Report  Carla Gomez is a 66 y.o. female with a history of altered mental status and twitching who is undergoing an EEG to evaluate for seizures.  Report: This EEG was acquired with electrodes placed according to the International 10-20 electrode system (including Fp1, Fp2, F3, F4, C3, C4, P3, P4, O1, O2, T3, T4, T5, T6, A1, A2, Fz, Cz, Pz). The following electrodes were missing or displaced: none.  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 This activity was diffuse, at times more predominant on the left. There was no focal slowing. Photic stimulation and hyperventilation were not performed. No sleep architecture was identified. Patient was noted on video to have near continuous mouth twitching and intermittent twitches left and right body (not simultaneous) during the recording.   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   Patient will be administered IV ativan and keppra and transferred to [facility] for cEEG.   Su Monks, MD [facility] [phone]  If 7pm- 7am, please page neurology on call as listed in [place].

## 2021-12-05 NOTE — Assessment & Plan Note (Signed)
Not quite sure the etiology of this.  We will recheck tomorrow to see if it is trending better.

## 2021-12-05 NOTE — Assessment & Plan Note (Signed)
See above

## 2021-12-05 NOTE — Progress Notes (Signed)
Pharmacy Antibiotic Note  Carla Gomez is a 66 y.o. female transferred from [facility] for seizures and EEG monitoring on 12/05/2021 with AKI and aspiration pneumonia.  Pharmacy has been consulted for Unasyn dosing.  Plan: Unasyn 3g IV Q24H.  Temp (24hrs), Avg:99.1 F (37.3 C), Min:98.7 F (37.1 C), Max:100 F (37.8 C)  Recent Labs  Lab 11/29/21 1832 11/29/21 2034 11/29/21 2240 11/30/21 0423 11/30/21 1300 11/30/21 1817 12/01/21 0511 12/02/21 0130 12/03/21 0327 12/04/21 0438 12/05/21 0421  WBC 8.9  --   --  8.3  --  9.7 9.7  --   --  11.2*  --   CREATININE 16.28*  --   --  15.79*   < > 15.49* 9.01* 9.01* 4.82* 5.29* 5.35*  LATICACIDVEN  --  0.6 0.7  --   --   --   --   --   --   --   --    < > = values in this interval not displayed.    Estimated Creatinine Clearance: 10.1 mL/min (A) (by C-G formula based on SCr of 5.35 mg/dL (H)).    Allergies  Allergen Reactions   Citrus     Triggers migraines   Grass Pollen([id number]-Swt Vern)    Latex     Redness around mouth after dental procedures    Thank you for allowing pharmacy to be a part of this patient's care.  Wynona Neat, PharmD, BCPS  12/05/2021 11:20 PM

## 2021-12-05 NOTE — Progress Notes (Signed)
Gave report to receiving RN Ival Bible at [facility] [phone]). [facility] picked up the Pt and her belongings for transfer. Pt is now D/C from [facility] [address].

## 2021-12-05 NOTE — H&P (Signed)
History and Physical    Albie Bazin WUJ:[id number] DOB: 03/11/1956 DOA: 12/05/2021  DOS: the patient was seen and examined on 12/05/2021  PCP: Albina Billet, MD   Patient coming from:  [facility]  I have personally briefly reviewed patient's old medical records in [place]  Chief complaint: Nonconvulsive status epilepticus History present illness: 66 year old white female transferred from [facility] to [facility] today due to EEG that showed nonconvulsive status epilepticus.  Patient was initially admitted to [facility] on 11/29/2021 due to altered mental status.  At that time she was noted to have acute kidney injury with a serum creatinine of 16.28 and a BUN of 123.  Nephrology was consulted and patient started on hemodialysis.  Over the course the next 2 weeks, patient continued to have encephalopathy.  She was also treated for aspiration pneumonia.  She had an EEG performed on 12/03/2021 which was concerning for status epilepticus and neurology was consulted.  They recommended transfer to [facility] for continuous EEG monitoring.  She was given a dose of IV Ativan and IV Keppra.  Patient currently cannot give a history review of systems due to her altered mental status.    Review of Systems:  Review of Systems  Unable to perform ROS: Mental status change   Past Medical History:  Diagnosis Date   Cancer ([place])    basal cell   Chronic fatigue    Chronic pain    Dental bridge present    permanent bottom right   Fibromyalgia    Hypertension    Migraine headache     Past Surgical History:  Procedure Laterality Date   ABDOMINAL HYSTERECTOMY     BREAST BIOPSY Left 09/26/14   lt bx/clip- benign   BREAST BIOPSY Left 11/02/14   benign   COLONOSCOPY WITH PROPOFOL N/A 12/21/2020   Procedure: COLONOSCOPY WITH PROPOFOL;  Surgeon: Lucilla Lame, MD;  Location: [place];  Service: Endoscopy;  Laterality: N/A;   NASAL SINUS SURGERY       reports that she has never  smoked. She has never used smokeless tobacco. She reports that she does not drink alcohol and does not use drugs.  Allergies  Allergen Reactions   Citrus     Triggers migraines   Grass Pollen(K-O-R-T-Swt Vern)    Latex     Redness around mouth after dental procedures    Family History  Problem Relation Age of Onset   Breast cancer Maternal Aunt        mat great aunt    Prior to Admission medications   Medication Sig Start Date End Date Taking? Authorizing Provider  acetaminophen (TYLENOL) 325 MG tablet Take 2 tablets (650 mg total) by mouth every 6 (six) hours as needed for mild pain (or Fever >/= 101). 12/05/21   Loletha Grayer, MD  Ampicillin-Sulbactam 3 g in sodium chloride 0.9 % 100 mL Inject 3 g into the vein daily. 12/06/21   Loletha Grayer, MD  heparin 5000 UNIT/ML injection Inject 1 mL (5,000 Units total) into the skin every 8 (eight) hours. 12/05/21   Loletha Grayer, MD  levETIRAcetam (KEPRRA) 500 MG/100ML SOLN Inject 100 mLs (500 mg total) into the vein daily. 12/06/21   Loletha Grayer, MD  metoprolol tartrate (LOPRESSOR) 5 MG/5ML SOLN injection Inject 2.5 mLs (2.5 mg total) into the vein every 6 (six) hours. 12/06/21   Loletha Grayer, MD  ondansetron (ZOFRAN) 4 MG/2ML SOLN injection Inject 2 mLs (4 mg total) into the vein every 6 (  six) hours as needed for nausea. 12/05/21   Wieting, Richard, MD  sodium chloride 0.9 % infusion Inject 50 mLs into the vein continuous. 12/05/21   Loletha Grayer, MD    Physical Exam: There were no vitals filed for this visit.  Physical Exam Vitals and nursing note reviewed.  Constitutional:      Appearance: She is obese.     Comments: Not awake. Mumbling incoherently  HENT:     Head: Normocephalic.  Eyes:     Comments: Resists opening of her upper eyelids. Pupils were reactive to light bilaterally.  Cardiovascular:     Rate and Rhythm: Regular rhythm. Tachycardia present.  Pulmonary:     Effort: Pulmonary effort is normal. No  respiratory distress.     Breath sounds: No wheezing or rales.  Abdominal:     General: Bowel sounds are normal. There is no distension.     Tenderness: There is no abdominal tenderness. There is no guarding.  Musculoskeletal:     Right lower leg: No edema.     Left lower leg: No edema.     Comments: Temporary hemodialysis catheter in the left internal jugular area.  Skin:    General: Skin is warm and dry.  Neurological:     Comments: Incoherently mumbling.  Unable to answer any questions.  There was not any convulsive activity.     Labs on Admission: I have personally reviewed following labs and imaging studies  CBC: Recent Labs  Lab 11/29/21 1832 11/30/21 0423 11/30/21 1817 12/01/21 0511 12/04/21 0438  WBC 8.9 8.3 9.7 9.7 11.2*  HGB 9.9* 9.0* 9.0* 9.3* 8.7*  HCT 28.4* 25.4* 25.8* 26.6* 25.3*  MCV 82.6 82.5 82.7 81.3 82.7  PLT 155 143* 134* 140* 086*   Basic Metabolic Panel: Recent Labs  Lab 11/30/21 1300 11/30/21 1817 12/01/21 0511 12/02/21 0130 12/03/21 0327 12/04/21 0438 12/05/21 0421  NA 121* 123* 133* 131* 136 140 141  K 4.8 5.1 4.0 4.0 3.4* 3.2* 3.4*  CL 85* 88* 95* 97* 101 103 105  CO2 15* 15* 20* 16* 22 24 20*  GLUCOSE 111* 99 88 94 99 98 101*  BUN 130* 125* 67* 76* 39* 48* 58*  CREATININE 15.21* 15.49* 9.01* 9.01* 4.82* 5.29* 5.35*  CALCIUM 8.0* 8.2* 8.2* 8.4* 8.2* 8.6* 8.6*  PHOS 11.0* 10.8*  --   --   --   --   --    GFR: Estimated Creatinine Clearance: 10.1 mL/min (A) (by C-G formula based on SCr of 5.35 mg/dL (H)). Liver Function Tests: Recent Labs  Lab 11/29/21 1832 11/30/21 1300 11/30/21 1817  AST 19 16  --   ALT 29 23  --   ALKPHOS 96 84  --   BILITOT 1.4* 1.1  --   PROT 7.7 6.9  --   ALBUMIN 3.7 3.3* 3.4*   No results for input(s): LIPASE, AMYLASE in the last 168 hours. No results for input(s): AMMONIA in the last 168 hours. Coagulation Profile: No results for input(s): INR, PROTIME in the last 168 hours. Cardiac Enzymes: Recent  Labs  Lab 11/30/21 0423 11/30/21 1300 12/01/21 0511 12/02/21 0130 12/03/21 0327  CKTOTAL 901* 732* 437* 246* 129   BNP (last 3 results) No results for input(s): PROBNP in the last 8760 hours. HbA1C: No results for input(s): HGBA1C in the last 72 hours. CBG: Recent Labs  Lab 11/29/21 1841 11/30/21 1627  GLUCAP 137* 102*   Lipid Profile: No results for input(s): CHOL, HDL, LDLCALC, TRIG, CHOLHDL, LDLDIRECT  in the last 72 hours. Thyroid Function Tests: No results for input(s): TSH, T4TOTAL, FREET4, T3FREE, THYROIDAB in the last 72 hours. Anemia Panel: No results for input(s): VITAMINB12, FOLATE, FERRITIN, TIBC, IRON, RETICCTPCT in the last 72 hours. Urine analysis:    Component Value Date/Time   COLORURINE YELLOW (A) 11/29/2021 2021   APPEARANCEUR HAZY (A) 11/29/2021 2021   LABSPEC 1.011 11/29/2021 2021   PHURINE 5.0 11/29/2021 2021   GLUCOSEU NEGATIVE 11/29/2021 2021   HGBUR LARGE (A) 11/29/2021 2021   BILIRUBINUR NEGATIVE 11/29/2021 2021   KETONESUR NEGATIVE 11/29/2021 2021   PROTEINUR 100 (A) 11/29/2021 2021   NITRITE NEGATIVE 11/29/2021 2021   LEUKOCYTESUR NEGATIVE 11/29/2021 2021    Radiological Exams on Admission: I have personally reviewed images EEG adult  Result Date: 12/05/2021 Derek Jack, MD     12/05/2021  7:08 PM Routine EEG Report Dalayna Navea Woodrow is a 66 y.o. female with a history of altered mental status and twitching who is undergoing an EEG to evaluate for seizures. Report: This EEG was acquired with electrodes placed according to the International 10-20 electrode system (including Fp1, Fp2, F3, F4, C3, C4, P3, P4, O1, O2, T3, T4, T5, T6, A1, A2, Fz, Cz, Pz). The following electrodes were missing or displaced: none. 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 This activity was diffuse, at times more predominant on the  left. There was no focal slowing. Photic stimulation and hyperventilation were not performed. No sleep architecture was identified. Patient was noted on video to have near continuous mouth twitching and intermittent twitches left and right body (not simultaneous) during the recording. 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 Patient will be administered IV ativan and keppra and transferred to [facility] for cEEG. Su Monks, MD [facility] [phone] If 7pm- 7am, please page neurology on call as listed in [employer].    EKG: My personal interpretation of EKG from 11-29-2021 shows: NSR     Assessment/Plan Principal Problem:   Status epilepticus ([place]) Active Problems:   Seizure ([place])   AKI (acute kidney injury) ([place])   Rhabdomyolysis   Acute metabolic encephalopathy   Aspiration pneumonia ([place])   Essential hypertension   Dyslipidemia   Anxiety   Overweight (BMI 25.0-29.9)   Chronic pain    Assessment and Plan: * Status epilepticus ([place]) Admit to med telemetry bed. Was reported that pt ativan and keppra at [facility] prior to transfer.  Neurology has been consulted.  They are ordering a EEG.  They will be managing the patient's AEDs. Unclear the etiology of her seizures.  Status epilepticus ([place]) is a Acute illness/condition that poses a threat to life or bodily function. IV Ativan 1 mg prn for seizures.   Seizure ([place]) See above  Aspiration pneumonia [facility]) Was noted at [facility].  Continue Unasyn.  Pharmacy to dose given her renal failure.  Check follow-up chest x-ray in the morning.  Acute metabolic encephalopathy Acute.  Unclear the exact etiology of her metabolic encephalopathy but could be due to her acute renal failure, seizures or other unknown causes at this time.   Continue with medical evaluation.  Check ammonia level, check RPR.  Rhabdomyolysis Improved from her original admission at [facility].  Continue to hold statin. Check CK in AM.  AKI (  acute kidney injury) ([place]) Acute. Seen by nephrology at [facility] Will need to continue HD. Please consult nephrology in AM to resume her HD.  Chronic pain Chronic.  Overweight (BMI 25.0-29.9) Chronic.  Anxiety Chronic.  Dyslipidemia Chronic.  Holding statin due to recent rhabdomyolysis.  Essential hypertension Chronic.  Keep NPO.  Continue with IV Lopressor 2 and half milligrams every 6 hours.    DVT prophylaxis: SQ Heparin Code Status: Full Code Family Communication: no family at bedside  Disposition Plan: unknown  Consults called: neurology  Admission status: Inpatient, Telemetry bed   Kristopher Oppenheim, DO [facility] 12/05/2021, 11:04 PM

## 2021-12-05 NOTE — Assessment & Plan Note (Addendum)
Creatinine >16 on admission, CKD IV ruled out, she probably has chronic kidney disease but the stage couldn't be clinically determined at this point.  UOP normal now.  Cr stabilized around 1.7-1.8. - Follow up with Nephrology after discharge

## 2021-12-05 NOTE — Progress Notes (Signed)
Initial Nutrition Assessment  DOCUMENTATION CODES:   Not applicable  INTERVENTION:   Initiate Nepro @ 20 ml/hr and increase by 10 ml every 4 hours to goal rate of 40 ml/hr.   45 ml Prosource TF BID.    80 ml free water flush every 6 hours to maintain tube patency  Tube feeding regimen provides 1908 kcal (100% of needs), 100 grams of protein, and 698 ml of H2O.  Total free water: 1018 ml daily  NUTRITION DIAGNOSIS:   Inadequate oral intake related to lethargy/confusion as evidenced by NPO status.  GOAL:   Patient will meet greater than or equal to 90% of their needs  MONITOR:   Diet advancement  REASON FOR ASSESSMENT:   Low [place]    ASSESSMENT:   Pt with medical history significant for fibromyalgia, hypertension, chronic pain and migraine, who presented with acute onset of altered mental status.  Pt admitted with AKI.   5/20- HD cath placed, first HD session  Reviewed I/O's: +442 ml x 24 hours and +7.3 L since admission   Pt very lethargic at time of visit. She did not respond to voice or touch. Noted meal tray untouched. Pt with involuntary jerking. Meal completions documented 0-20%. Pt has been refusing meals and medications.  Pt now NPO. Awaiting neurology evaluation. Case discussed with MD; not plans to start nutrition support today, but may consider tomorrow.   Reviewed wt hx; no wt loss noted over the past year.   Medications reviewed and include vitamin D3 and 0.9% sodium chloride infusion @ 40 ml/hr.   Lab Results  Component Value Date   HGBA1C 5.7 (H) 11/30/2021   PTA DM medications are none.   Labs reviewed: Phos: 10.8, CBGS: 102 (inpatient orders for glycemic control are none).    NUTRITION - FOCUSED PHYSICAL EXAM:  Flowsheet Row Most Recent Value  Orbital Region No depletion  Upper Arm Region No depletion  Thoracic and Lumbar Region No depletion  Buccal Region No depletion  Temple Region No depletion  Clavicle Bone Region No depletion   Clavicle and Acromion Bone Region No depletion  Scapular Bone Region No depletion  Dorsal Hand No depletion  Patellar Region No depletion  Anterior Thigh Region No depletion  Posterior Calf Region No depletion  Edema (RD Assessment) Mild  Hair Reviewed  Eyes Reviewed  Mouth Reviewed  Skin Reviewed  Nails Reviewed       Diet Order:   Diet Order             Diet NPO time specified  Diet effective now                   EDUCATION NEEDS:   Not appropriate for education at this time  Skin:  Skin Assessment: Reviewed RN Assessment  Last BM:  Unknown  Height:   Ht Readings from Last 1 Encounters:  11/29/21 '5\' 4"'$  (1.626 m)    Weight:   Wt Readings from Last 1 Encounters:  12/05/21 76.7 kg    Ideal Body Weight:  54.5 kg  BMI:  Body mass index is 29.02 kg/m.  Estimated Nutritional Needs:   Kcal:  1700-1900  Protein:  90-105 grams  Fluid:  1000 ml + UOP    Loistine Chance, RD, LDN, [place] Registered Dietitian II Certified Diabetes Care and Education Specialist Please refer to [facility] for RD and/or RD on-call/weekend/after hours pager

## 2021-12-05 NOTE — Assessment & Plan Note (Signed)
Patient right now unable to take oral medications.

## 2021-12-05 NOTE — Assessment & Plan Note (Addendum)
Patient seen after dialysis today and not really responsive today.  Its been 5 days in the hospital.  Initially thought secondary to uremia but now likely secondary to seizure.

## 2021-12-05 NOTE — Progress Notes (Signed)
OT Cancellation Note  Patient Details Name: Wave Calzada MRN: [id number] DOB: June 02, 1956   Cancelled Treatment:    Reason Eval/Treat Not Completed: Other (comment) (pt off floor for HD, OT will reattempt as able) Shanon Payor, OTD OTR/L  12/05/21, 1:04 PM

## 2021-12-05 NOTE — Discharge Summary (Addendum)
Physician Discharge Summary   Patient: Carla Gomez MRN: [id number] DOB: May 10, 1956  Admit date:     11/29/2021  Discharge date: 12/05/21  Discharge Physician: Loletha Grayer   PCP: Albina Billet, MD   Recommendations at discharge:   Follow up with Team at [facility] Neurology consult upon arrival Nephrology consult upo arrival  Discharge Diagnoses: Principal Problem:   Seizure [facility]) Active Problems:   Acute metabolic encephalopathy   AKI (acute kidney injury) ([place])   Rhabdomyolysis   Essential hypertension   Chronic pain   Anxiety   Dyslipidemia   Overweight (BMI 25.0-29.9)   Delirium due to another medical condition   Aspiration pneumonia ([place])   Elevated sedimentation rate   Hospital Course: 66 year old female with past medical history of fibromyalgia, hypertension and chronic pain who presented to the emergency room on 5/19 with altered mental status.  Patient was brought in by EMS after her neighbors noted she was shouting and confused.  In the emergency room, patient found to have urinary tract infection, sodium of 123 and creatinine of 16.28 with a BUN of 123.  Last documented labs on this patient for from 2017 noting normal renal function at that time.  CK level at 1375.  Renal ultrasound done noted some chronic renal disease, but no atrophy or hydronephrosis.  Patient was admitted to the hospital service and started on IV fluids.  Nephrology was consulted.  Although labs on 5/20 morning noted mildly improving creatinine with some mild urine output, follow-up labs that afternoon noted no urine output and worsening creatinine despite IV fluids.  Also developing myoclonus.  Patient transferred to stepdown and dialysis catheter placed and patient underwent hemodialysis.  By 5/21, patient still confused, but a bit more awake and alert.  She has since continued to have good urine output, but by 5/22 more drowsy and BUN rising.  Nephrology plans to repeat hemodialysis 5/22  without filtration.  5/23: Transferred to floor Psychiatry consulted and foley Dc'ed 5/24- creat up 4.8> 5.2, k 3.2 refusing k dur.  On 12/05/21, I got an EEG which was concerning for seizure.  Dr Quinn Axe called me to set up transfer to [facility] for continuous EEG monitoring and neuro consult upon arrival.  Assessment and Plan: * Seizure [facility]) Called by neurology with EEG with concern for seizure.  Ativan and Keppra ordered. Neurology recommend transfer to [facility] with continuous EEG monitoring and Neuro Consult.  Acute metabolic encephalopathy Patient seen after dialysis today and not really responsive today.  Its been 5 days in the hospital.  Initially thought secondary to uremia but now likely secondary to seizure.  AKI (acute kidney injury) ([place]) Creatinine 16.28 on presentation and down to 5.35 today.  Had dialysis session today.  Rhabdomyolysis Holding statin.  CPK last checked couple days ago and it was down to 129.  Essential hypertension Tachycardic and hypertensive.  Right now unable to take oral medications  Chronic pain Patient right now unable to take oral medications.  Anxiety Right now unable to take oral medications.  Dyslipidemia Discontinue statin  Overweight (BMI 25.0-29.9) Meets criteria BMI greater than 25.  Elevated sedimentation rate Not quite sure the etiology of this.  We will recheck tomorrow to see if it is trending better.  Aspiration pneumonia ([place]) Upper lobe pneumonia seen on initial testing.  Will start Unasyn and Zithromax and continue to monitor.         Consultants: Neurology, Nephrology Procedures performed: dialysi cath left neck Disposition: Transfer to [facility] Diet  recommendation:  NPO for now DISCHARGE MEDICATION: Allergies as of 12/05/2021       Reactions   Citrus    Triggers migraines   Grass Pollen([id number] Vern)    Latex    Redness around mouth after dental procedures        Medication List     STOP taking these  medications    ALPRAZolam 1 MG tablet Commonly known as: XANAX   amLODipine 5 MG tablet Commonly known as: NORVASC   cyclobenzaprine 10 MG tablet Commonly known as: FLEXERIL   HYDROcodone-acetaminophen 10-325 MG tablet Commonly known as: NORCO   morphine 100 MG 12 hr tablet Commonly known as: MS CONTIN   multivitamin tablet   pravastatin 40 MG tablet Commonly known as: PRAVACHOL   rosuvastatin 20 MG tablet Commonly known as: CRESTOR   SUMAtriptan 100 MG tablet Commonly known as: IMITREX   Vitamin D3 50 MCG (2000 UT) Tabs       TAKE these medications    acetaminophen 325 MG tablet Commonly known as: TYLENOL Take 2 tablets (650 mg total) by mouth every 6 (six) hours as needed for mild pain (or Fever >/= 101).   Ampicillin-Sulbactam 3 g in sodium chloride 0.9 % 100 mL Inject 3 g into the vein daily. Start taking on: Dec 06, 2021   heparin 5000 UNIT/ML injection Inject 1 mL (5,000 Units total) into the skin every 8 (eight) hours.   levETIRAcetam 500 MG/100ML Soln Commonly known as: KEPRRA Inject 100 mLs (500 mg total) into the vein daily. Start taking on: Dec 06, 2021   metoprolol tartrate 5 MG/5ML Soln injection Commonly known as: LOPRESSOR Inject 2.5 mLs (2.5 mg total) into the vein every 6 (six) hours. Start taking on: Dec 06, 2021   ondansetron 4 MG/2ML Soln injection Commonly known as: ZOFRAN Inject 2 mLs (4 mg total) into the vein every 6 (six) hours as needed for nausea.   sodium chloride 0.9 % infusion Inject 50 mLs into the vein continuous.        Discharge Exam: Filed Weights   12/02/21 1321 12/02/21 1637 12/05/21 1312  Weight: 78.5 kg 78.5 kg 76.7 kg   Physical Exam HENT:     Head: Normocephalic.     Mouth/Throat:     Comments: Unable to look into mouth. Eyes:     General: Lids are normal.     Comments: Patient resisted me opening up her eyes.  Cardiovascular:     Rate and Rhythm: Regular rhythm. Tachycardia present.     Heart  sounds: Normal heart sounds, S1 normal and S2 normal.  Pulmonary:     Breath sounds: Normal breath sounds. No decreased breath sounds, wheezing, rhonchi or rales.  Abdominal:     Palpations: Abdomen is soft.     Tenderness: There is no abdominal tenderness.  Musculoskeletal:     Right lower leg: Swelling present.     Left lower leg: Swelling present.  Skin:    General: Skin is warm.     Findings: No rash.  Neurological:     Mental Status: She is lethargic.     Comments: Twitching seen while is in the room.     Condition at discharge: fair  The results of significant diagnostics from this hospitalization (including imaging, microbiology, ancillary and laboratory) are listed below for reference.   Imaging Studies: DG Chest 1 View  Result Date: 12/01/2021 CLINICAL DATA:  Evaluate central line placement EXAM: CHEST  1 VIEW COMPARISON:  Nov 30, 2021  FINDINGS: The double lumen left central line terminates near the brachiocephalic confluence, stable. No pneumothorax. No improving left pulmonary infiltrate. No other interval changes. IMPRESSION: 1. The left central line is stable with the distal tip terminating near the brachiocephalic confluence. 2. Significant improvement in left-sided infiltrate. Electronically Signed   By: Dorise Bullion III M.D.   On: 12/01/2021 15:37   CT HEAD WO CONTRAST (5MM)  Result Date: 11/29/2021 CLINICAL DATA:  Fall EXAM: CT HEAD WITHOUT CONTRAST CT CERVICAL SPINE WITHOUT CONTRAST TECHNIQUE: Multidetector CT imaging of the head and cervical spine was performed following the standard protocol without intravenous contrast. Multiplanar CT image reconstructions of the cervical spine were also generated. RADIATION DOSE REDUCTION: This exam was performed according to the departmental dose-optimization program which includes automated exposure control, adjustment of the mA and/or kV according to patient size and/or use of iterative reconstruction technique. COMPARISON:  CT  head 05/16/2020, no prior cervical spine CT. FINDINGS: CT HEAD FINDINGS Brain: No evidence of acute infarction, hemorrhage, cerebral edema, mass, mass effect, or midline shift. No hydrocephalus or extra-axial fluid collection. Vascular: No hyperdense vessel. Skull: Normal. Negative for fracture or focal lesion. Sinuses/Orbits: Sequela of chronic right maxillary sinusitis and prior right maxillary antrostomy and right ethmoidectomy, with partial opacification of the right ethmoid air cells. The remaining paranasal sinuses are clear. The orbits are unremarkable. Other: The mastoid air cells are well aerated. CT CERVICAL SPINE FINDINGS Alignment: No listhesis. Skull base and vertebrae: No acute fracture. No primary bone lesion or focal pathologic process. Soft tissues and spinal canal: No prevertebral fluid or swelling. No visible canal hematoma. Disc levels: No high-grade spinal canal stenosis. Uncovertebral and facet arthropathy causes severe left neural foraminal narrowing at C3-C4 and moderate neural foraminal narrowing on the right at C3-C4 and on the left at C4-C5. Upper chest: Patchy opacities in the peripheral left upper lobe (series 3, image 97. Other: None. IMPRESSION: 1.  No acute intracranial process. 2.  No acute fracture or traumatic listhesis in the cervical spine. 3. Patchy opacities in the peripheral left upper lobe, which are nonspecific but could be infectious or inflammatory. Consider CT chest for further evaluation if clinically indicated. Electronically Signed   By: Merilyn Baba M.D.   On: 11/29/2021 19:13   CT Cervical Spine Wo Contrast  Result Date: 11/29/2021 CLINICAL DATA:  Fall EXAM: CT HEAD WITHOUT CONTRAST CT CERVICAL SPINE WITHOUT CONTRAST TECHNIQUE: Multidetector CT imaging of the head and cervical spine was performed following the standard protocol without intravenous contrast. Multiplanar CT image reconstructions of the cervical spine were also generated. RADIATION DOSE REDUCTION:  This exam was performed according to the departmental dose-optimization program which includes automated exposure control, adjustment of the mA and/or kV according to patient size and/or use of iterative reconstruction technique. COMPARISON:  CT head 05/16/2020, no prior cervical spine CT. FINDINGS: CT HEAD FINDINGS Brain: No evidence of acute infarction, hemorrhage, cerebral edema, mass, mass effect, or midline shift. No hydrocephalus or extra-axial fluid collection. Vascular: No hyperdense vessel. Skull: Normal. Negative for fracture or focal lesion. Sinuses/Orbits: Sequela of chronic right maxillary sinusitis and prior right maxillary antrostomy and right ethmoidectomy, with partial opacification of the right ethmoid air cells. The remaining paranasal sinuses are clear. The orbits are unremarkable. Other: The mastoid air cells are well aerated. CT CERVICAL SPINE FINDINGS Alignment: No listhesis. Skull base and vertebrae: No acute fracture. No primary bone lesion or focal pathologic process. Soft tissues and spinal canal: No prevertebral fluid or swelling. No  visible canal hematoma. Disc levels: No high-grade spinal canal stenosis. Uncovertebral and facet arthropathy causes severe left neural foraminal narrowing at C3-C4 and moderate neural foraminal narrowing on the right at C3-C4 and on the left at C4-C5. Upper chest: Patchy opacities in the peripheral left upper lobe (series 3, image 97. Other: None. IMPRESSION: 1.  No acute intracranial process. 2.  No acute fracture or traumatic listhesis in the cervical spine. 3. Patchy opacities in the peripheral left upper lobe, which are nonspecific but could be infectious or inflammatory. Consider CT chest for further evaluation if clinically indicated. Electronically Signed   By: Merilyn Baba M.D.   On: 11/29/2021 19:13   US Renal  Result Date: 11/29/2021 CLINICAL DATA:  Elevated creatinine EXAM: RENAL / URINARY TRACT ULTRASOUND COMPLETE COMPARISON:  06/23/2018  FINDINGS: Right Kidney: Renal measurements: 12.4 x 7.2 x 7.3 cm. = volume: 340 mL. Mild increased echogenicity is noted. No mass or hydronephrosis is noted. Left Kidney: Renal measurements: 11.5 x 5.1 x 5.7 cm. = volume: 174 mL. Mild increased echogenicity is noted. No mass or hydronephrosis is noted. Bladder: Appears normal for degree of bladder distention. Other: None. IMPRESSION: Mild increased echogenicity consistent with medical renal disease. No other focal abnormality is noted. Electronically Signed   By: Inez Catalina M.D.   On: 11/29/2021 21:21   DG Chest Port 1 View  Result Date: 11/30/2021 CLINICAL DATA:  Central line placement. EXAM: PORTABLE CHEST 1 VIEW COMPARISON:  None Available. FINDINGS: The heart size and mediastinal contours are within normal limits. Mild-to-moderate interstitial and airspace opacities are seen diffusely throughout the left breast. Mild right basilar interstitial and airspace opacities are noted. There is no pleural effusion or pneumothorax. The visualized skeletal structures are unremarkable. A left internal jugular central venous catheter tip overlies the expected location of the confluence of the left brachiocephalic vein and superior vena cava. IMPRESSION: 1. Left internal jugular central venous catheter tip overlies the expected location of the confluence of the left brachiocephalic vein and superior vena cava. No pneumothorax. 2. Mild-to-moderate left and mild right interstitial and airspace opacities may represent pulmonary edema and/or pneumonia. Electronically Signed   By: Zerita Boers M.D.   On: 11/30/2021 17:40   DG Abd 2 Views  Result Date: 11/30/2021 CLINICAL DATA:  66 year old female with abdominal pain. EXAM: ABDOMEN - 2 VIEW COMPARISON:  None Available. FINDINGS: Stool and gas in the colon and rectum noted. Nondistended gas-filled loops of small bowel are present. No dilated bowel loops are noted to suggest bowel obstruction. There is no evidence of  pneumoperitoneum. No suspicious calcifications are identified. IMPRESSION: 1. Nonspecific nonobstructive bowel gas pattern. No evidence of pneumoperitoneum. 2. No evidence of suspicious calcifications. Electronically Signed   By: Margarette Canada M.D.   On: 11/30/2021 10:48   EEG adult  Result Date: 12/05/2021 Derek Jack, MD     12/05/2021  7:08 PM Routine EEG Report Dwan Kimberlyn Quiocho is a 66 y.o. female with a history of altered mental status and twitching who is undergoing an EEG to evaluate for seizures. Report: This EEG was acquired with electrodes placed according to the International 10-20 electrode system (including Fp1, Fp2, F3, F4, C3, C4, P3, P4, O1, O2, T3, T4, T5, T6, A1, A2, Fz, Cz, Pz). The following electrodes were missing or displaced: none. 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 This activity was diffuse, at times more predominant on  the left. There was no focal slowing. Photic stimulation and hyperventilation were not performed. No sleep architecture was identified. Patient was noted on video to have near continuous mouth twitching and intermittent twitches left and right body (not simultaneous) during the recording. 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 Patient will be administered IV ativan and keppra and transferred to [facility] for cEEG. Su Monks, MD [facility] [phone] If 7pm- 7am, please page neurology on call as listed in [place].    Microbiology: Results for orders placed or performed during the hospital encounter of 11/29/21  MRSA Next Gen by PCR, Nasal     Status: None   Collection Time: 11/30/21  4:34 PM    Specimen: Nasal Mucosa; Nasal Swab  Result Value Ref Range Status   MRSA by PCR Next Gen NOT DETECTED NOT DETECTED Final    Comment: (NOTE) The GeneXpert MRSA Assay (FDA approved for NASAL specimens only), is one component of a comprehensive MRSA colonization surveillance program. It is not intended to diagnose MRSA infection nor to guide or monitor treatment for MRSA infections. Test performance is not FDA approved in patients less than 51 years old. Performed at [facility], [address], [place], [place] [address]   Urine Culture     Status: None   Collection Time: 12/02/21 11:25 AM   Specimen: Urine, Clean Catch  Result Value Ref Range Status   Specimen Description   Final    URINE, CLEAN CATCH Performed at [facility], [address], [place], [place] [address]    Special Requests   Final    NONE Performed at [facility], [address], [place], [place] [address]    Culture   Final    NO GROWTH Performed at [facility], [place] [address] [place], [place] [address]    Report Status 12/03/2021 FINAL  Final    Labs: CBC: Recent Labs  Lab 11/29/21 1832 11/30/21 0423 11/30/21 1817 12/01/21 0511 12/04/21 0438  WBC 8.9 8.3 9.7 9.7 11.2*  HGB 9.9* 9.0* 9.0* 9.3* 8.7*  HCT 28.4* 25.4* 25.8* 26.6* 25.3*  MCV 82.6 82.5 82.7 81.3 82.7  PLT 155 143* 134* 140* 735*   Basic Metabolic Panel: Recent Labs  Lab 11/30/21 1300 11/30/21 1817 12/01/21 0511 12/02/21 0130 12/03/21 0327 12/04/21 0438 12/05/21 0421  NA 121* 123* 133* 131* 136 140 141  K 4.8 5.1 4.0 4.0 3.4* 3.2* 3.4*  CL 85* 88* 95* 97* 101 103 105  CO2 15* 15* 20* 16* 22 24 20*  GLUCOSE 111* 99 88 94 99 98 101*  BUN 130* 125* 67* 76* 39* 48* 58*  CREATININE 15.21* 15.49* 9.01* 9.01* 4.82* 5.29* 5.35*  CALCIUM 8.0* 8.2* 8.2* 8.4* 8.2* 8.6* 8.6*  PHOS 11.0* 10.8*  --   --   --   --   --    Liver Function Tests: Recent Labs  Lab 11/29/21 1832 11/30/21 1300  11/30/21 1817  AST 19 16  --   ALT 29 23  --   ALKPHOS 96 84  --   BILITOT 1.4* 1.1  --   PROT 7.7 6.9  --   ALBUMIN 3.7 3.3* 3.4*   CBG: Recent Labs  Lab 11/29/21 1841 11/30/21 1627  GLUCAP 137* 102*    Discharge time spent: greater than 30 minutes.  Signed: Loletha Grayer, MD [facility] 12/05/2021

## 2021-12-05 NOTE — Progress Notes (Signed)
Hemodialysis Post Treatment Note   25 May 20223  Access: LIJ Catheter   BFR: 350 Treatment Time: 3 Hours  UF Removed: 0 ml   Next Treatment: 12/06/21  Note:  Patient tolerates treatment without incident, no fluid removed this session, prescribed BFR not met, reduced due to high arterial and venous pressure. Patient non-verbal, alert, responding to voice. Noted tremors throughout session. No meds or labs given. Patient transported to assigned room, report given to primary nurse,

## 2021-12-05 NOTE — Assessment & Plan Note (Addendum)
Sepsis ruled out. Pneumonia treated and resolved.

## 2021-12-05 NOTE — Progress Notes (Signed)
[facility]  PROGRESS NOTE   Subjective:   Patient resting quietly in bed, eyes closed Tremors appear increased from earlier this week   Patient seen later during dialysis   HEMODIALYSIS FLOWSHEET:  Blood Flow Rate (mL/min): 400 mL/min Arterial Pressure (mmHg): -180 mmHg Venous Pressure (mmHg): 150 mmHg Transmembrane Pressure (mmHg): 70 mmHg Ultrafiltration Rate (mL/min): 170 mL/min Dialysate Flow Rate (mL/min): 500 ml/min Conductivity: Machine : 13.7 Conductivity: Machine : 13.7 Dialysis Fluid Bolus: Normal Saline Bolus Amount (mL): 250 mL  Eyes open, nonverbal. Tremors increased   Objective:  Vital signs: Blood pressure (!) 147/83, pulse (!) 107, temperature 99.3 F (37.4 C), temperature source Oral, resp. rate (!) 32, height '5\' 4"'$  (1.626 m), weight 78.5 kg, SpO2 90 %.  Intake/Output Summary (Last 24 hours) at 12/05/2021 1248 Last data filed at 12/05/2021 3235 Gross per 24 hour  Intake 441.84 ml  Output --  Net 441.84 ml    Filed Weights   11/30/21 2212 12/02/21 1321 12/02/21 1637  Weight: 76.4 kg 78.5 kg 78.5 kg     Physical Exam: General:  No acute distress  Head:  Normocephalic, atraumatic. Moist oral mucosal membranes  Eyes:  Anicteric  Lungs:   Clear to auscultation, normal effort  Heart:  S1S2 no rubs  Abdomen:   Soft, nontender, bowel sounds present  Extremities:  1+ peripheral edema.  Neurologic:  Nonfocal, not following commands, tremors  Skin:  No lesions  Access: Lt IJ temp cath    Basic Metabolic Panel: Recent Labs  Lab 11/30/21 1300 11/30/21 1817 12/01/21 0511 12/02/21 0130 12/03/21 0327 12/04/21 0438 12/05/21 0421  NA 121* 123* 133* 131* 136 140 141  K 4.8 5.1 4.0 4.0 3.4* 3.2* 3.4*  CL 85* 88* 95* 97* 101 103 105  CO2 15* 15* 20* 16* 22 24 20*  GLUCOSE 111* 99 88 94 99 98 101*  BUN 130* 125* 67* 76* 39* 48* 58*  CREATININE 15.21* 15.49* 9.01* 9.01* 4.82* 5.29* 5.35*  CALCIUM 8.0* 8.2* 8.2* 8.4* 8.2* 8.6* 8.6*   PHOS 11.0* 10.8*  --   --   --   --   --      CBC: Recent Labs  Lab 11/29/21 1832 11/30/21 0423 11/30/21 1817 12/01/21 0511 12/04/21 0438  WBC 8.9 8.3 9.7 9.7 11.2*  HGB 9.9* 9.0* 9.0* 9.3* 8.7*  HCT 28.4* 25.4* 25.8* 26.6* 25.3*  MCV 82.6 82.5 82.7 81.3 82.7  PLT 155 143* 134* 140* 117*      Urinalysis: No results for input(s): COLORURINE, LABSPEC, PHURINE, GLUCOSEU, HGBUR, BILIRUBINUR, KETONESUR, PROTEINUR, UROBILINOGEN, NITRITE, LEUKOCYTESUR in the last 72 hours.  Invalid input(s): APPERANCEUR     Imaging: No results found.   Medications:    sodium chloride 50 mL/hr at 12/04/21 1451    Chlorhexidine Gluconate Cloth  6 each Topical Q0600   Chlorhexidine Gluconate Cloth  6 each Topical Q0600   cholecalciferol   Oral Daily   heparin  5,000 Units Subcutaneous Q8H   heparin sodium (porcine)       metoprolol tartrate  2.5 mg Intravenous Q6H   multivitamin with minerals  1 tablet Oral Daily    Assessment/ Plan:     66 y.o. female with a PMHx of hypertension, hyperlipidemia, fibromyalgia, chronic pain syndrome and migraine headaches now admitted via the emergency room yesterday with history of mental status changes.  She was found to have a creatinine of 16.2 with a BUN of 123.  She also has a sodium of 123 yesterday.  Her CPK level on admission was 1375.  She was started on IV fluid resuscitation with minimal improvement of urine output.She had a renal sonogram done in the emergency room which was negative.  She was dialyzed last night with improvement in her mentation this morning.   Principal Problem:   AKI (acute kidney injury) ([place] ) Active Problems:   Rhabdomyolysis   Essential hypertension   Dyslipidemia   Anxiety   Acute metabolic encephalopathy   Overweight (BMI 25.0-29.9)   Abnormal finding of lung   #1: Acute kidney injury: Patient has acute kidney injury with metabolic acidosis and hyponatremia with decreased urine output.   Patient's mentation  continues to fluctuate. Tremors appear worse today. Creatinine and BUN elevated. Will perform dialysis today. Plan to dialyze tomorrow and remove temp cath. Will ask Vascular to place permcath early next week. Patient appears to need temporary dialysis for the near future.      #2: Hyponatremia: Hyponatremia most likely secondary to decreased p.o. intake.  Will continue to improve with dialysis and oral nutrition.   Sodium corrected, but oral nutrition remains poor.    #3: Acute Metabolic acidosis: Metabolic acidosis may be secondary to renal failure complicated by possible infection.    Bicarb decreased today. Will correct with dialysis   #4: Rhabdomyolysis: resolved    #5: Hypertension: Metoprolol daily.  Blood pressure 147/83 during dialysis    LOS: [facility] 5/25/202312:48 PM

## 2021-12-05 NOTE — Progress Notes (Signed)
The patient was seen and examined at her bedside x 2 prior to transportation to [facility] for continuous EEG.  She was fidgety upon initial assessment after she had already received her dose of IV Ativan, but this was prior to starting IV Keppra.    Currently, she is somnolent after her first dose of IV Keppra.  Not in distress.  No use of accessory muscles to breathe.  Heart is with regular rate and rhythm at the time of this exam.   Mild edema noted in upper and lower extremities bilaterally. O2 saturation 94% on RA.  IV antibiotics in place for aspiration PNA/CAP were replaced from Unasyn to IV Levaquin with the assistance of pharmacy to avoid medications that might lower the seizure threshold in the setting of new seizure activity tonight.  The patient's son, Carla Gomez, was called to inform of the transfer from [facility] to [facility].  All questions answered via phone to the best of my ability.  Nathan's # [phone] (He was at work at the time of this phone call).

## 2021-12-05 NOTE — Assessment & Plan Note (Signed)
Chronic. 

## 2021-12-05 NOTE — Assessment & Plan Note (Signed)
Called by neurology with EEG with concern for seizure.  Ativan and Keppra ordered. Neurology recommend transfer to [facility] with continuous EEG monitoring and Neuro Consult.

## 2021-12-05 NOTE — Assessment & Plan Note (Signed)
Upper lobe pneumonia seen on initial testing.  Will start Unasyn and Zithromax and continue to monitor.

## 2021-12-05 NOTE — Assessment & Plan Note (Addendum)
-   At baseline patient lived independently, has no prior diagnosis of cognitive impairment.  On arrival to OSH initially she was somnolent, disoriented, and unable to answer questions appropriately. 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  Continued seizures were ruled out.  Sedating medicines have been minimized and at this point we suspect any persistent cognitive impairment is residual from her status epilepticus, and the prognosis of recovery is unclear - MRI brain 5/29 normal.  LP showed no signs of HSV and negative encephalitis panel, NMDA rec IgG negative. - Xanax stopped; She was transitioned to low dose clonazepam and the plan over the next month should be slow taper - Neurology follow up in 3 months  - Needs Neuropsychiatric/cognitive testing in 6 months

## 2021-12-05 NOTE — Assessment & Plan Note (Addendum)
-   Continue Keppra - Neuro follow up in 3 months

## 2021-12-05 NOTE — Assessment & Plan Note (Signed)
Discontinue statin 

## 2021-12-05 NOTE — Assessment & Plan Note (Signed)
Creatinine 16.28 on presentation and down to 5.35 today.  Had dialysis session today.

## 2021-12-05 NOTE — Assessment & Plan Note (Addendum)
Chronic. - Resume statin at discharge

## 2021-12-05 NOTE — Care Management Important Message (Signed)
Important Message  Patient Details  Name: Carla Gomez MRN: [id number] Date of Birth: 10-Aug-1955   Medicare Important Message Given:  Yes     Juliann Pulse A Leilan Bochenek 12/05/2021, 2:45 PM

## 2021-12-05 NOTE — Assessment & Plan Note (Signed)
Tachycardic and hypertensive.  Right now unable to take oral medications

## 2021-12-05 NOTE — Consult Note (Signed)
Pharmacy Antibiotic Note  Carla Gomez is a 66 y.o. female admitted on 11/29/2021 with acute metabolic encephalopathy, now concerned for possible aspiration pneumonia. Pharmacy has been consulted for Unasyn dosing. Patient is on intermittent HD.    Plan: Unasyn 3 g IV 24h  Pt is also on azithromycin 500 mg IV q24h  Monitor clinical picture and renal function/HD plan  F/U C&S, abx deescalation / LOT   Height: '5\' 4"'$  (162.6 cm) Weight: 76.7 kg (169 lb 1.5 oz) IBW/kg (Calculated) : 54.7  Temp (24hrs), Avg:98.6 F (37 C), Min:98.1 F (36.7 C), Max:99.3 F (37.4 C)  Recent Labs  Lab 11/29/21 1832 11/29/21 2034 11/29/21 2240 11/30/21 0423 11/30/21 1300 11/30/21 1817 12/01/21 0511 12/02/21 0130 12/03/21 0327 12/04/21 0438 12/05/21 0421  WBC 8.9  --   --  8.3  --  9.7 9.7  --   --  11.2*  --   CREATININE 16.28*  --   --  15.79*   < > 15.49* 9.01* 9.01* 4.82* 5.29* 5.35*  LATICACIDVEN  --  0.6 0.7  --   --   --   --   --   --   --   --    < > = values in this interval not displayed.    Estimated Creatinine Clearance: 10.1 mL/min (A) (by C-G formula based on SCr of 5.35 mg/dL (H)).    Allergies  Allergen Reactions   Citrus     Triggers migraines   Grass Pollen(K-O-[id number]-T-Swt Vern)    Latex     Redness around mouth after dental procedures    Antimicrobials this admission: 5/20 ceftriaxone >> 5/22 5/25 azithromycin >>  5/25 Unasyn >>   Dose adjustments this admission:   Microbiology results: 5/22 UCx: NG  5/20 MRSA PCR: negative  Thank you for allowing pharmacy to be a part of this patient's care.  Darnelle Bos, PharmD 12/05/2021 3:15 PM

## 2021-12-05 NOTE — Assessment & Plan Note (Addendum)
Improved from her original admission at [facility].

## 2021-12-05 NOTE — Assessment & Plan Note (Addendum)
Patient has been on decades of daily opiates.  Here, she has obvious had these held for >2 weeks and appears to have no symptoms of pain or withdrawal. - Avoid opiates for pain control

## 2021-12-06 ENCOUNTER — Other Ambulatory Visit: Payer: Medicare Other

## 2021-12-06 ENCOUNTER — Inpatient Hospital Stay (HOSPITAL_COMMUNITY): Payer: Medicare Other

## 2021-12-06 ENCOUNTER — Other Ambulatory Visit (HOSPITAL_COMMUNITY): Payer: Medicare Other

## 2021-12-06 DIAGNOSIS — G40901 Epilepsy, unspecified, not intractable, with status epilepticus: Secondary | ICD-10-CM | POA: Diagnosis not present

## 2021-12-06 DIAGNOSIS — E162 Hypoglycemia, unspecified: Secondary | ICD-10-CM | POA: Diagnosis not present

## 2021-12-06 DIAGNOSIS — T82838A Hemorrhage of vascular prosthetic devices, implants and grafts, initial encounter: Secondary | ICD-10-CM

## 2021-12-06 DIAGNOSIS — R569 Unspecified convulsions: Secondary | ICD-10-CM | POA: Diagnosis not present

## 2021-12-06 DIAGNOSIS — N179 Acute kidney failure, unspecified: Secondary | ICD-10-CM

## 2021-12-06 DIAGNOSIS — J690 Pneumonitis due to inhalation of food and vomit: Secondary | ICD-10-CM | POA: Diagnosis not present

## 2021-12-06 DIAGNOSIS — G9341 Metabolic encephalopathy: Secondary | ICD-10-CM | POA: Diagnosis not present

## 2021-12-06 LAB — CBC WITH DIFFERENTIAL/PLATELET
Abs Immature Granulocytes: 0.18 10*3/uL — ABNORMAL HIGH (ref 0.00–0.07)
Basophils Absolute: 0 10*3/uL (ref 0.0–0.1)
Basophils Relative: 0 %
Eosinophils Absolute: 0.3 10*3/uL (ref 0.0–0.5)
Eosinophils Relative: 2 %
HCT: 20.8 % — ABNORMAL LOW (ref 36.0–46.0)
Hemoglobin: 7.1 g/dL — ABNORMAL LOW (ref 12.0–15.0)
Immature Granulocytes: 1 %
Lymphocytes Relative: 10 %
Lymphs Abs: 1.3 10*3/uL (ref 0.7–4.0)
MCH: 28.5 pg (ref 26.0–34.0)
MCHC: 34.1 g/dL (ref 30.0–36.0)
MCV: 83.5 fL (ref 80.0–100.0)
Monocytes Absolute: 0.7 10*3/uL (ref 0.1–1.0)
Monocytes Relative: 6 %
Neutro Abs: 10 10*3/uL — ABNORMAL HIGH (ref 1.7–7.7)
Neutrophils Relative %: 81 %
Platelets: 90 10*3/uL — ABNORMAL LOW (ref 150–400)
RBC: 2.49 MIL/uL — ABNORMAL LOW (ref 3.87–5.11)
RDW: 14.7 % (ref 11.5–15.5)
WBC: 12.5 10*3/uL — ABNORMAL HIGH (ref 4.0–10.5)
nRBC: 0 % (ref 0.0–0.2)

## 2021-12-06 LAB — COMPREHENSIVE METABOLIC PANEL
ALT: 43 U/L (ref 0–44)
ALT: 41 U/L (ref 0–44)
AST: 55 U/L — ABNORMAL HIGH (ref 15–41)
AST: 47 U/L — ABNORMAL HIGH (ref 15–41)
Albumin: 2.9 g/dL — ABNORMAL LOW (ref 3.5–5.0)
Albumin: 3 g/dL — ABNORMAL LOW (ref 3.5–5.0)
Alkaline Phosphatase: 90 U/L (ref 38–126)
Alkaline Phosphatase: 85 U/L (ref 38–126)
Anion gap: 13 (ref 5–15)
Anion gap: 10 (ref 5–15)
BUN: 29 mg/dL — ABNORMAL HIGH (ref 8–23)
BUN: 31 mg/dL — ABNORMAL HIGH (ref 8–23)
CO2: 26 mmol/L (ref 22–32)
CO2: 27 mmol/L (ref 22–32)
Calcium: 8.5 mg/dL — ABNORMAL LOW (ref 8.9–10.3)
Calcium: 8.2 mg/dL — ABNORMAL LOW (ref 8.9–10.3)
Chloride: 100 mmol/L (ref 98–111)
Chloride: 98 mmol/L (ref 98–111)
Creatinine, Ser: 3.02 mg/dL — ABNORMAL HIGH (ref 0.44–1.00)
Creatinine, Ser: 3.26 mg/dL — ABNORMAL HIGH (ref 0.44–1.00)
GFR, Estimated: 16 mL/min — ABNORMAL LOW (ref >60)
GFR, Estimated: 15 mL/min — ABNORMAL LOW (ref >60)
Glucose, Bld: 102 mg/dL — ABNORMAL HIGH (ref 70–99)
Glucose, Bld: 106 mg/dL — ABNORMAL HIGH (ref 70–99)
Potassium: 2.7 mmol/L — CL (ref 3.5–5.1)
Potassium: 2.8 mmol/L — ABNORMAL LOW (ref 3.5–5.1)
Sodium: 139 mmol/L (ref 135–145)
Sodium: 135 mmol/L (ref 135–145)
Total Bilirubin: 1 mg/dL (ref 0.3–1.2)
Total Bilirubin: 0.9 mg/dL (ref 0.3–1.2)
Total Protein: 5.9 g/dL — ABNORMAL LOW (ref 6.5–8.1)
Total Protein: 6.1 g/dL — ABNORMAL LOW (ref 6.5–8.1)

## 2021-12-06 LAB — SEDIMENTATION RATE: Sed Rate: 89 mm/hr — ABNORMAL HIGH (ref 0–22)

## 2021-12-06 LAB — MAGNESIUM
Magnesium: 1.9 mg/dL (ref 1.7–2.4)
Magnesium: 1.7 mg/dL (ref 1.7–2.4)

## 2021-12-06 LAB — MYOGLOBIN, SERUM: Myoglobin: 426 ng/mL — ABNORMAL HIGH (ref 25–58)

## 2021-12-06 LAB — GLUCOSE, CAPILLARY
Glucose-Capillary: 73 mg/dL (ref 70–99)
Glucose-Capillary: 88 mg/dL (ref 70–99)
Glucose-Capillary: 178 mg/dL — ABNORMAL HIGH (ref 70–99)
Glucose-Capillary: 102 mg/dL — ABNORMAL HIGH (ref 70–99)
Glucose-Capillary: 91 mg/dL (ref 70–99)
Glucose-Capillary: 105 mg/dL — ABNORMAL HIGH (ref 70–99)
Glucose-Capillary: 110 mg/dL — ABNORMAL HIGH (ref 70–99)
Glucose-Capillary: 114 mg/dL — ABNORMAL HIGH (ref 70–99)
Glucose-Capillary: 64 mg/dL — ABNORMAL LOW (ref 70–99)

## 2021-12-06 LAB — PROTIME-INR
INR: 1.1 (ref 0.8–1.2)
Prothrombin Time: 13.8 seconds (ref 11.4–15.2)

## 2021-12-06 LAB — PREPARE RBC (CROSSMATCH)

## 2021-12-06 LAB — LACTIC ACID, PLASMA: Lactic Acid, Venous: 0.8 mmol/L (ref 0.5–1.9)

## 2021-12-06 LAB — AMMONIA: Ammonia: <10 umol/L (ref 9–35)

## 2021-12-06 LAB — ABO/RH: ABO/RH(D): O POS

## 2021-12-06 LAB — RPR: RPR Ser Ql: NONREACTIVE

## 2021-12-06 LAB — C-REACTIVE PROTEIN: CRP: 3.6 mg/dL — ABNORMAL HIGH (ref <1.0)

## 2021-12-06 LAB — CK: Total CK: 89 U/L (ref 38–234)

## 2021-12-06 MED ORDER — DEXTROSE 50 % IV SOLN
50.0000 mL | Freq: Once | INTRAVENOUS | Status: AC
  Administered 2021-12-06: 50 mL via INTRAVENOUS
  Filled 2021-12-06: qty 50

## 2021-12-06 MED ORDER — LEVETIRACETAM IN NACL 500 MG/100ML IV SOLN
500.0000 mg | INTRAVENOUS | Status: DC
  Administered 2021-12-07 – 2021-12-09 (×3): 500 mg via INTRAVENOUS
  Filled 2021-12-06 (×3): qty 100

## 2021-12-06 MED ORDER — SODIUM CHLORIDE 0.9 % IV SOLN
200.0000 mg | Freq: Once | INTRAVENOUS | Status: AC
  Administered 2021-12-06: 200 mg via INTRAVENOUS
  Filled 2021-12-06: qty 20

## 2021-12-06 MED ORDER — MAGNESIUM SULFATE 2 GM/50ML IV SOLN
2.0000 g | Freq: Once | INTRAVENOUS | Status: AC
  Administered 2021-12-06: 2 g via INTRAVENOUS
  Filled 2021-12-06: qty 50

## 2021-12-06 MED ORDER — VALPROATE SODIUM 100 MG/ML IV SOLN
200.0000 mg | Freq: Four times a day (QID) | INTRAVENOUS | Status: DC
  Administered 2021-12-06 – 2021-12-09 (×12): 200 mg via INTRAVENOUS
  Filled 2021-12-06 (×15): qty 2

## 2021-12-06 MED ORDER — CHLORHEXIDINE GLUCONATE CLOTH 2 % EX PADS
6.0000 | MEDICATED_PAD | Freq: Every day | CUTANEOUS | Status: DC
  Administered 2021-12-06: 6 via TOPICAL

## 2021-12-06 MED ORDER — DEXTROSE 50 % IV SOLN: 12.5000 g | INTRAVENOUS | Status: AC

## 2021-12-06 MED ORDER — DEXTROSE 50 % IV SOLN: 1.0000 | Freq: Once | INTRAVENOUS | Status: DC

## 2021-12-06 MED ORDER — LORAZEPAM 2 MG/ML IJ SOLN
2.0000 mg | Freq: Once | INTRAMUSCULAR | Status: AC
  Administered 2021-12-06: 2 mg via INTRAVENOUS
  Filled 2021-12-06: qty 1

## 2021-12-06 MED ORDER — LORAZEPAM 2 MG/ML IJ SOLN
2.0000 mg | INTRAMUSCULAR | Status: DC | PRN
  Administered 2021-12-06 (×2): 2 mg via INTRAVENOUS
  Filled 2021-12-06 (×2): qty 1

## 2021-12-06 MED ORDER — LORAZEPAM 2 MG/ML IJ SOLN
2.0000 mg | INTRAMUSCULAR | Status: DC | PRN
  Administered 2021-12-06 – 2021-12-09 (×4): 2 mg via INTRAVENOUS
  Filled 2021-12-06 (×5): qty 1

## 2021-12-06 MED ORDER — FUROSEMIDE 10 MG/ML IJ SOLN
20.0000 mg | Freq: Once | INTRAMUSCULAR | Status: AC
  Administered 2021-12-06: 20 mg via INTRAVENOUS
  Filled 2021-12-06: qty 4

## 2021-12-06 MED ORDER — POTASSIUM CHLORIDE 10 MEQ/100ML IV SOLN
10.0000 meq | INTRAVENOUS | Status: AC
  Administered 2021-12-06 (×3): 10 meq via INTRAVENOUS
  Filled 2021-12-06 (×2): qty 100

## 2021-12-06 MED ORDER — ALTEPLASE 2 MG IJ SOLR
2.0000 mg | Freq: Once | INTRAMUSCULAR | Status: AC
  Administered 2021-12-06: 2 mg
  Filled 2021-12-06: qty 2

## 2021-12-06 MED ORDER — SODIUM CHLORIDE 0.9 % IV SOLN: 250.0000 mg | INTRAVENOUS | Status: DC | PRN

## 2021-12-06 MED ORDER — VALPROATE SODIUM 100 MG/ML IV SOLN: 1600.0000 mg | Freq: Once | INTRAVENOUS | Status: DC

## 2021-12-06 MED ORDER — "THROMBI-PAD 3""X3"" EX PADS"
1.0000 | MEDICATED_PAD | Freq: Once | CUTANEOUS | Status: AC
  Administered 2021-12-06: 1 via TOPICAL
  Filled 2021-12-06: qty 1

## 2021-12-06 MED ORDER — VALPROATE SODIUM 100 MG/ML IV SOLN
1600.0000 mg | Freq: Once | INTRAVENOUS | Status: AC
  Administered 2021-12-06: 1600 mg via INTRAVENOUS
  Filled 2021-12-06: qty 16

## 2021-12-06 MED ORDER — SODIUM CHLORIDE 0.9% IV SOLUTION: Freq: Once | INTRAVENOUS | Status: AC

## 2021-12-06 MED ORDER — KCL-LACTATED RINGERS-D5W 20 MEQ/L IV SOLN
INTRAVENOUS | Status: DC
  Filled 2021-12-06: qty 1000

## 2021-12-06 MED ORDER — CHLORHEXIDINE GLUCONATE CLOTH 2 % EX PADS
6.0000 | MEDICATED_PAD | Freq: Every day | CUTANEOUS | Status: DC
  Administered 2021-12-06 – 2021-12-12 (×7): 6 via TOPICAL

## 2021-12-06 MED ORDER — SODIUM CHLORIDE 0.9% FLUSH
10.0000 mL | Freq: Two times a day (BID) | INTRAVENOUS | Status: DC
  Administered 2021-12-06 – 2021-12-13 (×14): 10 mL

## 2021-12-06 MED ORDER — CHLORHEXIDINE GLUCONATE CLOTH 2 % EX PADS: 6.0000 | MEDICATED_PAD | Freq: Every day | CUTANEOUS | Status: DC

## 2021-12-06 MED ORDER — SODIUM CHLORIDE 0.9 % IV SOLN
100.0000 mg | Freq: Two times a day (BID) | INTRAVENOUS | Status: DC
  Administered 2021-12-06 – 2021-12-09 (×7): 100 mg via INTRAVENOUS
  Filled 2021-12-06 (×8): qty 10

## 2021-12-06 MED ORDER — DEXTROSE 50 % IV SOLN
INTRAVENOUS | Status: AC
  Administered 2021-12-06: 12.5 g via INTRAVENOUS
  Filled 2021-12-06: qty 50

## 2021-12-06 MED ORDER — SODIUM CHLORIDE 0.9 % IV SOLN: 50.0000 mg | INTRAVENOUS | Status: DC | PRN

## 2021-12-06 MED ORDER — DEXTROSE 50 % IV SOLN: 12.5000 g | INTRAVENOUS | Status: DC | PRN

## 2021-12-06 MED ORDER — POTASSIUM CHLORIDE IN NACL 40-0.9 MEQ/L-% IV SOLN
INTRAVENOUS | Status: DC
  Filled 2021-12-06: qty 1000

## 2021-12-06 NOTE — Progress Notes (Signed)
Vascular surgeon came to see the patient, said IJ catheter is ok. Continue to monitor.

## 2021-12-06 NOTE — Progress Notes (Signed)
vLTM started all impedances below 10kohms  [facility] to monitor

## 2021-12-06 NOTE — Significant Event (Signed)
The nurse notified me that patient's left IJ catheter was bleeding.  On exam at bedside there is slight oozing from the left IJ catheter insertion site with slight swelling around the neck.  Patient at this moment is not in any respiratory compromise.  We will closely monitor.  I consulted vascular surgeon Dr. Roselie Awkward, who will be seeing patient.  We will discontinue patient's heparin for now.  Reviewed patient's labs and medications.  Type and screen follow CBC check INR.  Further recommendations per Dr. Roselie Awkward.  Dr. Hal Hope

## 2021-12-06 NOTE — Progress Notes (Signed)
  Transition of Care [facility]) Screening Note   Patient Details  Name: Carla Gomez Date of Birth: 05-15-1956   Transition of Care [facility]) CM/SW Contact:    Geralynn Ochs, LCSW Phone Number: 12/06/2021, 2:29 PM    Transition of Care Department [facility]) has reviewed patient, medical workup ongoing. We will continue to monitor patient advancement through interdisciplinary progression rounds. If new patient transition needs arise, please place a TOC consult.

## 2021-12-06 NOTE — Plan of Care (Signed)
  Problem: Education: Goal: Expressions of having a comfortable level of knowledge regarding the disease process will increase Outcome: Progressing   Problem: Clinical Measurements: Goal: Complications related to the disease process, condition or treatment will be avoided or minimized Outcome: Progressing Goal: Diagnostic test results will improve Outcome: Progressing   Problem: Clinical Measurements: Goal: Ability to maintain clinical measurements within normal limits will improve Outcome: Progressing Goal: Diagnostic test results will improve Outcome: Progressing Goal: Respiratory complications will improve Outcome: Progressing Goal: Cardiovascular complication will be avoided Outcome: Progressing   Problem: Safety: Goal: Ability to remain free from injury will improve Outcome: Progressing   Problem: Skin Integrity: Goal: Risk for impaired skin integrity will decrease Outcome: Progressing

## 2021-12-06 NOTE — Progress Notes (Signed)
Arrived to unit to assess purple PORT per consult reported no blood return. At the time VAST nurse arrived. Pt currently receiving blood through lumen. Notified nurse to reconsult when transfusion is complete. VU. Garfield Cornea VAST

## 2021-12-06 NOTE — Consult Note (Signed)
NAME:  Carla Gomez, MRN:  [id number], DOB:  08-27-1955, LOS: 1 ADMISSION DATE:  12/05/2021, CONSULTATION DATE:  5/26 REFERRING MD:  Hal Hope, REASON FOR CONSULT: Altered mental status  History of Present Illness:  Patient is encephalopathic and/or intubated. Therefore history has been obtained from chart review.   Carla Gomez, is a 66 y.o. female, who presented to the [place] ED on 5/19 with a chief complaint of altered mental status.  She was found to have an AKI and rhabdomyolysis.  They have a pertinent past medical history of fibromyalgia, chronic pain with migraines, hypertension  She was admitted to [facility] by the hospitalist.  She was transferred to [facility] on 5/25 for persistent altered mental status thought to be nonconvulsive status epilepticus.  PCCM was consulted for worsening mental status, agitation.  Pertinent  Medical History  fibromyalgia, chronic pain with migraines, hypertension  Significant Hospital Events: Including procedures, antibiotic start and stop dates in addition to other pertinent events   5/26: PCCM consult  Interim History / Subjective:  See above  Unable to obtain subjective evaluation due to patient status  Objective   Blood pressure (!) 166/109, pulse (!) 113, temperature 97.6 F (36.4 C), temperature source Axillary, resp. rate (!) 28, weight 79.3 kg, SpO2 97 %.        Intake/Output Summary (Last 24 hours) at 12/06/2021 2302 Last data filed at 12/06/2021 2220 Gross per 24 hour  Intake 1808.81 ml  Output 4050 ml  Net -2241.19 ml   Filed Weights   12/06/21 0500  Weight: 79.3 kg    Examination: General: In bed, restless, obese HEENT: MM pink/moist, anicteric, atraumatic, left IJ central line site with slight swelling Neuro: RASS +1, PERRL 6m, moves all extremities, opens eyes to pain, purposeful, and inappropriate speech CV: S1S2, NSR, no m/r/g appreciated PULM: Air movement noted in all lobes, trachea midline, chest  expansion symmetric GI: soft, bsx4 active, non-tender   Extremities: warm/dry, no pretibial edema, capillary refill less than 3 seconds  Skin:  no rashes or lesions noted  Ammonia within normal limits Hemoglobin 7 Lactic acid within normal limits CRP 3.6, sed rate 89 RPR nonreactive Potassium 2.8 Sodium 135 Creatinine 3.26, BUN 31 CXR:   Resolved Hospital Problem list     Assessment & Plan:  Nonconvulsive status epilepticus Altered mental status, suspect secondary to SE vs hypoglycemia Concerns per nursing staff the patient being GCS of 8 on floor.  GCS 11 on exam.  Patient with hypoglycemia.  Could be contributing to issues with mental status. -Transfer to ICU for closer monitoring -EEG per neurology -AEDs per neurology.  On Vimpat, Keppra, valproate. -CBG checks -Continue neuroprotective measures- normothermia, euglycemia, HOB greater than 30, head in neutral alignment, normocapnia, normoxia.  -PRN ativan  Aspiration pneumonia secondary nonconvulsive status epilepticus -Continue Unasyn -Monitor fever WBC curve -Pulm toilet as able  Hypoglycemia Patient NPO -continue q1h CBG -Start DR LR with 20K -PRN dextrose if needed.  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 Rhabdomyolysis-resolved, Secondary to status epilepticus Hypokalemia, suspect secondary to HD -Patient with improving urine output, holding off on dialysis at this time per neph -Nephrology following -On D5LR with 20 K -recheck K in AM  Normocytic anemia Status post 1 unit PRBC -Transfuse PRBC if HBG less than 7 -Obtain AM CBC to trend H&H -Monitor for signs of bleeding   Best Practice (right click and "Reselect all SmartList Selections" daily)   Diet/type: NPO DVT prophylaxis: SCD GI prophylaxis: PPI Lines: Central line Foley:  N/A Code Status:  full code Last date of multidisciplinary goals of care discussion [pending]  Labs   CBC: Recent Labs  Lab 11/30/21 0423 11/30/21 1817 12/01/21 0511 12/04/21 0438  12/06/21 0432  WBC 8.3 9.7 9.7 11.2* 12.5*  NEUTROABS  --   --   --   --  10.0*  HGB 9.0* 9.0* 9.3* 8.7* 7.1*  HCT 25.4* 25.8* 26.6* 25.3* 20.8*  MCV 82.5 82.7 81.3 82.7 83.5  PLT 143* 134* 140* 117* 90*    Basic Metabolic Panel: Recent Labs  Lab 11/30/21 1300 11/30/21 1817 12/01/21 0511 12/03/21 0327 12/04/21 0438 12/05/21 0421 12/06/21 0432 12/06/21 1450  NA 121* 123*   < > 136 140 141 139 135  K 4.8 5.1   < > 3.4* 3.2* 3.4* 2.7* 2.8*  CL 85* 88*   < > 101 103 105 100 98  CO2 15* 15*   < > 22 24 20* 26 27  GLUCOSE 111* 99   < > 99 98 101* 102* 106*  BUN 130* 125*   < > 39* 48* 58* 29* 31*  CREATININE 15.21* 15.49*   < > 4.82* 5.29* 5.35* 3.02* 3.26*  CALCIUM 8.0* 8.2*   < > 8.2* 8.6* 8.6* 8.5* 8.2*  MG  --   --   --   --   --   --  1.9 1.7  PHOS 11.0* 10.8*  --   --   --   --   --   --    < > = values in this interval not displayed.   GFR: Estimated Creatinine Clearance: 16.8 mL/min (A) (by C-G formula based on SCr of 3.26 mg/dL (H)). Recent Labs  Lab 11/30/21 1817 12/01/21 0511 12/04/21 0438 12/06/21 0432  WBC 9.7 9.7 11.2* 12.5*  LATICACIDVEN  --   --   --  0.8    Liver Function Tests: Recent Labs  Lab 11/30/21 1300 11/30/21 1817 12/06/21 0432 12/06/21 1450  AST 16  --  55* 47*  ALT 23  --  43 41  ALKPHOS 84  --  90 85  BILITOT 1.1  --  1.0 0.9  PROT 6.9  --  5.9* 6.1*  ALBUMIN 3.3* 3.4* 2.9* 3.0*   No results for input(s): LIPASE, AMYLASE in the last 168 hours. Recent Labs  Lab 12/06/21 0432  AMMONIA <10    ABG No results found for: PHART, PCO2ART, PO2ART, HCO3, TCO2, ACIDBASEDEF, O2SAT   Coagulation Profile: Recent Labs  Lab 12/06/21 0554  INR 1.1    Cardiac Enzymes: Recent Labs  Lab 11/30/21 1300 12/01/21 0511 12/02/21 0130 12/03/21 0327 12/06/21 0432  CKTOTAL 732* 437* 246* 129 89    HbA1C: Hgb A1c MFr Bld  Date/Time Value Ref Range Status  11/30/2021 01:00 PM 5.7 (H) 4.8 - 5.6 % Final    Comment:    (NOTE) Pre  diabetes:          5.7%-6.4%  Diabetes:              >6.4%  Glycemic control for   <7.0% adults with diabetes     CBG: Recent Labs  Lab 12/06/21 1208 12/06/21 1621 12/06/21 1802 12/06/21 2011 12/06/21 2251  GLUCAP 102* 91 105* 110* 64*    Review of Systems:   Unable to obtain review of systems due to patient status.  Past Medical History:  She,  has a past medical history of Cancer ([place]), Chronic fatigue, Chronic pain, Dental bridge present, Fibromyalgia, Hypertension, and  Migraine headache.   Surgical History:   Past Surgical History:  Procedure Laterality Date   ABDOMINAL HYSTERECTOMY     BREAST BIOPSY Left 09/26/14   lt bx/clip- benign   BREAST BIOPSY Left 11/02/14   benign   COLONOSCOPY WITH PROPOFOL N/A 12/21/2020   Procedure: COLONOSCOPY WITH PROPOFOL;  Surgeon: Lucilla Lame, MD;  Location: [place];  Service: Endoscopy;  Laterality: N/A;   NASAL SINUS SURGERY       Social History:   reports that she has never smoked. She has never used smokeless tobacco. She reports that she does not drink alcohol and does not use drugs.   Family History:  Her family history includes Breast cancer in her maternal aunt.   Allergies Allergies  Allergen Reactions   Citrus Other (See Comments)    Triggers migraines   Latex Other (See Comments)    Redness around mouth after dental procedures   Other Other (See Comments)    ALLERGENS: Exposure to Inhalants such as grasses, pollen, trees, mold, mildew, cat dander, cows.  REACTION: post nasal drainage, runny nose and/or chronic sinus infections     Home Medications  Prior to Admission medications   Medication Sig Start Date End Date Taking? Authorizing Provider  amLODipine (NORVASC) 5 MG tablet Take 5 mg by mouth daily.   Yes [provider]  Cholecalciferol (VITAMIN D) 50 MCG (2000 UT) CAPS Take 2,000 Units by mouth daily.   Yes [provider]  cyclobenzaprine (FLEXERIL) 10 MG tablet Take 10 mg  by mouth daily.   Yes [provider]  morphine (MS CONTIN) 100 MG 12 hr tablet Take 100 mg by mouth daily.   Yes [provider]  Multiple Vitamin (MULTIVITAMIN WITH MINERALS) TABS tablet Take 1 tablet by mouth daily.   Yes [provider]  naloxone (NARCAN) nasal spray 4 mg/0.1 mL Place 1 spray into the nose See admin instructions. Place one spray in the nare as needed for opioid reversal.  May repeat once in the opposite nare if needed. 09/28/21  Yes [provider]  rosuvastatin (CRESTOR) 20 MG tablet Take 20 mg by mouth daily at 6 PM.   Yes [provider]  SUMAtriptan (IMITREX) 100 MG tablet Take 100 mg by mouth every 2 (two) hours as needed for migraine. May repeat in 2 hours if headache persists or recurs.   Yes [provider]  acetaminophen (TYLENOL) 325 MG tablet Take 2 tablets (650 mg total) by mouth every 6 (six) hours as needed for mild pain (or Fever >/= 101). 12/05/21   Loletha Grayer, MD  Ampicillin-Sulbactam 3 g in sodium chloride 0.9 % 100 mL Inject 3 g into the vein daily. 12/06/21   Loletha Grayer, MD  heparin 5000 UNIT/ML injection Inject 1 mL (5,000 Units total) into the skin every 8 (eight) hours. 12/05/21   Loletha Grayer, MD  levETIRAcetam (KEPRRA) 500 MG/100ML SOLN Inject 100 mLs (500 mg total) into the vein daily. 12/06/21   Loletha Grayer, MD  metoprolol tartrate (LOPRESSOR) 5 MG/5ML SOLN injection Inject 2.5 mLs (2.5 mg total) into the vein every 6 (six) hours. 12/06/21   Loletha Grayer, MD  ondansetron (ZOFRAN) 4 MG/2ML SOLN injection Inject 2 mLs (4 mg total) into the vein every 6 (six) hours as needed for nausea. 12/05/21   Wieting, Richard, MD  sodium chloride 0.9 % infusion Inject 50 mLs into the vein continuous. 12/05/21   Loletha Grayer, MD     Critical care time: 24  minutes

## 2021-12-06 NOTE — Procedures (Signed)
Patient Name: Carla Gomez  MRN: [id number]  Epilepsy Attending: Lora Havens  Referring Physician/Provider: Lorenza Chick, MD Duration: 12/05/2021 2301 to 12/06/2021 2301  Patient history: 66 year old woman with past medical history significant for chronic pain on chronic opiates, chronic benzodiazepine use, fibromyalgia, hyperlipidemia.  She has presented with acute renal failure of unknown etiology now with nonconvulsive status epilepticus.  EEG to evaluate for seizure.  Level of alertness:  lethargic   AEDs during EEG study: Keppra, Vimpat, Depakote, Ativan  Technical aspects: This EEG study was done with scalp electrodes positioned according to the 10-20 International system of electrode placement. Electrical activity was acquired at a sampling rate of '500Hz'$  and reviewed with a high frequency filter of '70Hz'$  and a low frequency filter of '1Hz'$ . EEG data were recorded continuously and digitally stored.   Description: No clear posterior dominant rhythm was seen.  EEG showed continuous generalized polymorphic sharply contoured predominantly 5 to 8 Hz theta and alpha activity admixed with intermittent generalized 2 to 3 Hz delta slowing.    Patient event button was pressed on 12/06/2021 at 1103 during which patient had subtle jerking, alteration go awareness per RN. Concomitant eeg before, during and after the event didn't show any eeg change to suggest seizure.   Patient was noted to have subtle whole body jerking, more prominent when awake/stimulated. Concomitant eeg showed high amplitude sharply contoured 3-'6hz'$  theta-delta slowing without definite evolution.   Hyperventilation and photic stimulation were not performed.     ABNORMALITY - Continuous slow, generalized  IMPRESSION: This study is suggestive of severe diffuse encephalopathy, nonspecific to etiology. No seizures or epileptiform discharges were seen throughout the recording.  Patient was noted to have subtle whole body  jerks, more prominent when awake/stimulated without concomitant eeg change. These episodes were most likely not epileptic. Stimulation induced myoclonus could have similar appearance.   Aylan Bayona Barbra Sarks

## 2021-12-06 NOTE — Progress Notes (Signed)
Date and time results received: 12/06/21 0555 (use smartphrase ".now" to insert current time)  Test: Potassium Critical Value: 2.7  Name of Provider Notified: Hal Hope, MD  Orders Received? Or Actions Taken?: Orders Received - See Orders for details

## 2021-12-06 NOTE — Progress Notes (Addendum)
Purple lumen TPA'd x 2hrs, flushes but remains without blood return. Unit RN at bedside and aware. Unit RN aware 2nd dose of TPA to be ordered by MD if applicable.

## 2021-12-06 NOTE — Plan of Care (Signed)
  Problem: Education: Goal: Expressions of having a comfortable level of knowledge regarding the disease process will increase Outcome: Not Progressing   Problem: Coping: Goal: Ability to adjust to condition or change in health will improve Outcome: Not Progressing Goal: Ability to identify appropriate support needs will improve Outcome: Not Progressing   Problem: Health Behavior/Discharge Planning: Goal: Compliance with prescribed medication regimen will improve Outcome: Not Progressing   Problem: Medication: Goal: Risk for medication side effects will decrease Outcome: Not Progressing   Problem: Clinical Measurements: Goal: Complications related to the disease process, condition or treatment will be avoided or minimized Outcome: Not Progressing Goal: Diagnostic test results will improve Outcome: Not Progressing   Problem: Safety: Goal: Verbalization of understanding the information provided will improve Outcome: Not Progressing   Problem: Self-Concept: Goal: Level of anxiety will decrease Outcome: Not Progressing Goal: Ability to verbalize feelings about condition will improve Outcome: Not Progressing   Problem: Health Behavior/Discharge Planning: Goal: Ability to manage health-related needs will improve Outcome: Not Progressing   Problem: Clinical Measurements: Goal: Ability to maintain clinical measurements within normal limits will improve Outcome: Not Progressing Goal: Will remain free from infection Outcome: Not Progressing Goal: Diagnostic test results will improve Outcome: Not Progressing Goal: Respiratory complications will improve Outcome: Not Progressing Goal: Cardiovascular complication will be avoided Outcome: Not Progressing   Problem: Nutrition: Goal: Adequate nutrition will be maintained Outcome: Not Progressing   Problem: Safety: Goal: Ability to remain free from injury will improve Outcome: Not Progressing   Problem: Skin Integrity: Goal:  Risk for impaired skin integrity will decrease Outcome: Not Progressing

## 2021-12-06 NOTE — Progress Notes (Signed)
   12/06/21 2203  Assess: MEWS Score  BP (!) 166/109  Pulse Rate (!) 113  ECG Heart Rate (!) 111  Resp (!) 28  SpO2 97 %  O2 Device Nasal Cannula  O2 Flow Rate (L/min) 2 L/min  Assess: MEWS Score  MEWS Temp 0  MEWS Systolic 0  MEWS Pulse 2  MEWS RR 2  MEWS LOC 1  MEWS Score 5  MEWS Score Color Red  Assess: if the MEWS score is Yellow or Red  Were vital signs taken at a resting state? Yes  Focused Assessment No change from prior assessment  Does the patient meet 2 or more of the SIRS criteria? No  Early Detection of Sepsis Score *See Row Information* Low  MEWS guidelines implemented *See Row Information* Yes  Treat  MEWS Interventions Administered scheduled meds/treatments;Escalated (See documentation below)  Pain Scale PAINAD  Breathing 1  Negative Vocalization 2  Facial Expression 1  Body Language 1  Consolability 2  PAINAD Score 7  Escalate  MEWS: Escalate Red: discuss with charge nurse/RN and provider, consider discussing with RRT  Notify: Charge Nurse/RN  Name of Charge Nurse/RN Notified Pyl RN  Date Charge Nurse/RN Notified 12/06/21  Time Charge Nurse/RN Notified 2200  Notify: Provider  Provider Name/Title Dr.Karakandy  Date Provider Notified 12/06/21  Time Provider Notified 2130  Method of Notification Page  Notification Reason Change in status  Provider response See new orders  Date of Provider Response 12/06/21  Time of Provider Response 2130  Notify: Rapid Response  Date Rapid Response Notified 12/06/21  Time Rapid Response Notified 2200  Document  Patient Outcome Transferred/level of care increased  Progress note created (see row info) Yes

## 2021-12-06 NOTE — Progress Notes (Signed)
Patient ID: Carla Gomez, female   DOB: 10-01-1955, 66 y.o.   MRN: [id number]  Adding depakote 20 mg/kg loading dose followed by 10 mg/kg divided q6hr

## 2021-12-06 NOTE — Progress Notes (Signed)
Patient becoming more agitated and anxious. Patient also extremely restless. Mews has been yellow d/t tachycardia and tachypnea. Patient continues to pull at lines, EEG leads and cap, and other tubing. Patient has erratic, garbled speech as well. Unable to orient patient or engage patient in meaningful conversation. Patient's electrolytes such as Mag and K+ low earlier today on re-check around 1400. Patient also received 1 unit of PRBC's earlier today. Notified Dr. Hal Hope of concerns for this patient. BP is presently 166/109. Patient is also on seizure precautions with last noted seizure around 1100 this morning. Rapid response team and CCM arrived at patient's bedside for assessment. Orders received for restraints and for patient transfer.

## 2021-12-06 NOTE — Progress Notes (Signed)
PROGRESS NOTE   Carla Gomez  DEY:[id number] DOB: 01-25-1956 DOA: 12/05/2021 PCP: Carla Billet, MD  Brief Narrative:  66 year old white female underlying fibromyalgia, chronic pain with migraines, HTN, 1.6 cm hypoechoic ovoid lesion vulva followed by Carla Gomez  Hospitalized 5/19-5/25 [facility] toxic metabolic encephalopathy stuttering repeating herself tangential Profound hyponatremia 123 K81 CO2 15  BUN 123/creatinine 16 gap 27 CK13 75  hemoglobin 9.9 Further work-up = patchy opacities LUL  Seen by nephrology and initiated on dialysis via right IJ psychiatry assessed the patient as mentation did not improve and patient remained encephalopathic despite initiation of HD at the hospital at [facility] 5/25 :work-up for encephalopathy seizure-like activity led to EEG showed abnormality concerning for nonconvulsive status epilepticus-patient was given IV Ativan and IV Keppra transferred to hospitalist service at [facility] under the direction of neurologist Dr. Quinn Gomez   Hospital-Problem based course  Nonconvulsive status epilepticus  cEEG in progress-patient had epileptiform episode at around 11 AM this morning-Dr. Lynnae Gomez epileptologist/neurologist is aware patient was given 1 of Ativan IV and will need to be monitored closely Defer AED initiation/titration to neurology Would keep blood sugars above 150 and allow blood pressures to run slightly higher for good perfusion Would correct all electrolyte anomalies as well as hyper or hypoglycemia and hypokalemia all can decrease the threshold for central irritability Aspiration pneumonia secondary to nonconvulsive status Try to control secretions-get speech therapy to see when she is more awake-May need to discuss parenteral methods of feed Continue Unasyn at this time stop date 5/26 (7 days) Severe hypokalemia 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 Metabolic acidosis on admission now resolved Being replaced aggressively secondary to  above discussion labs this evening along with magnesium Consulted and appreciate nephrology input ANCA ANA hepatitis myoglobin HIV drug screen were all negative in terms of work-up for severe AKI Rhabdomyolysis on admission at [facility] [place] Possibly a precipitant could have been her chronic pain meds/psychotropic medications?-Previously was on Flexeril 10 MS Contin 100 daily [!]  As well as Crestor and Imitrex Obviously holding all pain meds/statin at this time as well as psychotropic agents Acetone ethanol isopropyl and methanol volatile substances are pending CRP was elevated at 3.6 sed rate was normal Bleeding IJ access Appreciate vascular surgeon Carla Gomez input-thrombin pad to area-monitor closely Trasnfuse 1 U prbc   DVT prophylaxis: SCD Code Status: Presumed full Family Communication: None present at the bedside [facility] Carla Gomez [phone] Disposition:  Status is: Inpatient Remains inpatient appropriate because:   Having seizures and AKI and need for iHD   Consultants:  Neurology Nephrology  Procedures:   cEEG  Antimicrobials:     Subjective: Unresponisve --GCS ~ 11/15   Objective: Vitals:   12/06/21 1000 12/06/21 1100 12/06/21 1200 12/06/21 1235  BP:    (!) 137/110  Pulse: (!) 102 (!) 122 (!) 104 (!) 107  Resp: 17 (!) 30 (!) 27 17  Temp:      TempSrc:      SpO2: 98% 92% 97% 99%  Weight:        Intake/Output Summary (Last 24 hours) at 12/06/2021 1303 Last data filed at 12/06/2021 1200 Gross per 24 hour  Intake 814.07 ml  Output 1700 ml  Net -885.93 ml   Filed Weights   12/06/21 0500  Weight: 79.3 kg    Examination:  Awake coherent in nad no focal deficit no ict no pallor Neck soft supple  Cta b clear Abd soft nt nd no rebound Neuro withdraws to pain  Data Reviewed: personally reviewed   CBC    Component Value Date/Time   WBC 12.5 (H) 12/06/2021 0432   RBC 2.49 (L) 12/06/2021 0432   HGB 7.1 (L) 12/06/2021 0432   HGB 13.4 10/26/2014 1011    HCT 20.8 (L) 12/06/2021 0432   HCT 39.1 10/26/2014 1011   PLT 90 (L) 12/06/2021 0432   PLT 239 10/26/2014 1011   MCV 83.5 12/06/2021 0432   MCV 89 10/26/2014 1011   MCH 28.5 12/06/2021 0432   MCHC 34.1 12/06/2021 0432   RDW 14.7 12/06/2021 0432   RDW 12.9 10/26/2014 1011   LYMPHSABS 1.3 12/06/2021 0432   LYMPHSABS 2.0 10/26/2014 1011   MONOABS 0.7 12/06/2021 0432   MONOABS 0.4 10/26/2014 1011   EOSABS 0.3 12/06/2021 0432   EOSABS 0.2 10/26/2014 1011   BASOSABS 0.0 12/06/2021 0432   BASOSABS 0.1 10/26/2014 1011      Latest Ref Rng & Units 12/06/2021    4:32 AM 12/05/2021    4:21 AM 12/04/2021    4:38 AM  CMP  Glucose 70 - 99 mg/dL 102   101   98    BUN 8 - 23 mg/dL 29   58   48    Creatinine 0.44 - 1.00 mg/dL 3.02   5.35   5.29    Sodium 135 - 145 mmol/L 139   141   140    Potassium 3.5 - 5.1 mmol/L 2.7   3.4   3.2    Chloride 98 - 111 mmol/L 100   105   103    CO2 22 - 32 mmol/L '26   20   24    '$ Calcium 8.9 - 10.3 mg/dL 8.5   8.6   8.6    Total Protein 6.5 - 8.1 g/dL 5.9      Total Bilirubin 0.3 - 1.2 mg/dL 1.0      Alkaline Phos 38 - 126 U/L 90      AST 15 - 41 U/L 55      ALT 0 - 44 U/L 43         Radiology Studies: DG CHEST PORT 1 VIEW  Result Date: 12/06/2021 CLINICAL DATA:  Aspiration pneumonia EXAM: PORTABLE CHEST 1 VIEW COMPARISON:  12/01/2021 FINDINGS: Stable elevation the right hemidiaphragm. Similar lung aeration. Some residual patchy density in the left lung. Minimal atelectasis/scarring right lung base. Stable cardiomediastinal contours. IMPRESSION: Similar lung aeration. Electronically Signed   By: Carla Gomez M.D.   On: 12/06/2021 08:28   EEG adult  Result Date: 12/05/2021 Carla Jack, MD     12/05/2021  7:08 PM Routine EEG Report Carla Gomez is a 66 y.o. female with a history of altered mental status and twitching who is undergoing an EEG to evaluate for seizures. Report: This EEG was acquired with electrodes placed according to the  International 10-20 electrode system (including Fp1, Fp2, F3, F4, C3, C4, P3, P4, O1, O2, T3, T4, T5, T6, A1, A2, Fz, Cz, Pz). The following electrodes were missing or displaced: none. 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 This activity was diffuse, at times more predominant on the left. There was no focal slowing. Photic stimulation and hyperventilation were not performed. No sleep architecture was identified. Patient was noted on video to have near continuous mouth twitching and intermittent twitches left and right body (not simultaneous) during the recording. Impression and clinical correlation: This  EEG was obtained while awake and drowsy and is abnormal due to rhythmic high-voltage primarily theta frequencies admixed with continuous sharp wave discharges concerning for nonconvulsive status epilepticus. Patient will be administered IV ativan and keppra and transferred to [facility] for cEEG. Su Monks, MD [facility] [phone] If 7pm- 7am, please page neurology on call as listed in [place].     Scheduled Meds:  Chlorhexidine Gluconate Cloth  6 each Topical Daily   metoprolol tartrate  2.5 mg Intravenous Q6H   sodium chloride flush  10-40 mL Intracatheter Q12H   Continuous Infusions:  ampicillin-sulbactam (UNASYN) IV     lacosamide (VIMPAT) IV Stopped (12/06/21 1030)   lacosamide (VIMPAT) IV     levETIRAcetam     levETIRAcetam Stopped (12/06/21 0854)   [START ON 12/07/2021] levETIRAcetam     valproate sodium 52 mL/hr at 12/06/21 1200     LOS: 1 day   Time spent: Hamberg, MD [facility] To contact the attending provider between 7A-7P or the covering provider during after hours 7P-7A, please log into the web site [email or web address] and access using universal [place] password for that web site. If you do not have the  password, please call the hospital operator.  12/06/2021, 1:03 PM

## 2021-12-06 NOTE — Progress Notes (Addendum)
[facility] NOTE   MRN : [id number]  Reason for Consult: left neck temp HD cath bleeding Referring Physician: Hal Hope  History of Present Illness: 66 y/o female transferred form [place] with epilepticus forun to have AKI and was placed on HD.  We have been asked to access bleeding around the temp HD cath on the left Neck     Current Facility-Administered Medications  Medication Dose Route Frequency Provider Last Rate Last Admin   0.9 %  sodium chloride infusion  50 mL Intravenous Continuous Kristopher Oppenheim, DO 50 mL/hr at 12/06/21 0012 50 mL at 12/06/21 0012   acetaminophen (TYLENOL) suppository 650 mg  650 mg Rectal Q4H PRN Kristopher Oppenheim, DO       Ampicillin-Sulbactam (UNASYN) 3 g in sodium chloride 0.9 % 100 mL IVPB  3 g Intravenous Q24H Bryk, Veronda P, RPH       Chlorhexidine Gluconate Cloth 2 % PADS 6 each  6 each Topical Daily Samtani, Jai-Gurmukh, MD       dextrose 50 % solution 50 mL  1 ampule Intravenous Once Rise Patience, MD       lacosamide (VIMPAT) 100 mg in sodium chloride 0.9 % 25 mL IVPB  100 mg Intravenous Q12H Bhagat, Srishti L, MD       lacosamide (VIMPAT) 50 mg in sodium chloride 0.9 % 25 mL IVPB  50 mg Intravenous Q dialysis Bhagat, Srishti L, MD       levETIRAcetam (KEPPRA) 250 mg in sodium chloride 0.9 % 100 mL IVPB  250 mg Intravenous Q dialysis Bhagat, Srishti L, MD       levETIRAcetam (KEPPRA) IVPB 500 mg/100 mL premix  500 mg Intravenous Q24H Kristopher Oppenheim, DO       [START ON 12/07/2021] levETIRAcetam (KEPPRA) IVPB 500 mg/100 mL premix  500 mg Intravenous Q24H Bhagat, Srishti L, MD       LORazepam (ATIVAN) injection 2 mg  2 mg Intravenous PRN Bhagat, Srishti L, MD   2 mg at 12/06/21 0556   metoprolol tartrate (LOPRESSOR) injection 2.5 mg  2.5 mg Intravenous Q6H Kristopher Oppenheim, DO   2.5 mg at 12/06/21 2878   potassium chloride 10 mEq in 100 mL IVPB  10 mEq Intravenous Q1 Hr x 3 Rise Patience, MD       sodium chloride flush (NS)  0.9 % injection 10-40 mL  10-40 mL Intracatheter Q12H Nita Sells, MD       valproate (DEPACON) 200 mg in dextrose 5 % 50 mL IVPB  200 mg Intravenous Q6H Bhagat, Srishti L, MD        Pt meds include: Statin :No Betablocker: Yes ASA: No Other anticoagulants/antiplatelets:   Past Medical History:  Diagnosis Date   Cancer ([place])    basal cell   Chronic fatigue    Chronic pain    Dental bridge present    permanent bottom right   Fibromyalgia    Hypertension    Migraine headache     Past Surgical History:  Procedure Laterality Date   ABDOMINAL HYSTERECTOMY     BREAST BIOPSY Left 09/26/14   lt bx/clip- benign   BREAST BIOPSY Left 11/02/14   benign   COLONOSCOPY WITH PROPOFOL N/A 12/21/2020   Procedure: COLONOSCOPY WITH PROPOFOL;  Surgeon: Lucilla Lame, MD;  Location: [place];  Service: Endoscopy;  Laterality: N/A;   NASAL SINUS SURGERY      Social History Social History   Tobacco Use   Smoking  status: Never   Smokeless tobacco: Never  Vaping Use   Vaping Use: Never used  Substance Use Topics   Alcohol use: Never   Drug use: Never    Family History Family History  Problem Relation Age of Onset   Breast cancer Maternal Aunt        mat great aunt    Allergies  Allergen Reactions   Citrus     Triggers migraines   Grass Pollen(K-O-R-T-Swt Vern)    Latex     Redness around mouth after dental procedures     REVIEW OF SYSTEMS  General: '[ ]'$  Weight loss, '[ ]'$  Fever, '[ ]'$  chills Neurologic: '[ ]'$  Dizziness, '[ ]'$  Blackouts, '[ ]'$  Seizure '[ ]'$  Stroke, '[ ]'$  "Mini stroke", '[ ]'$  Slurred speech, '[ ]'$  Temporary blindness; '[ ]'$  weakness in arms or legs, '[ ]'$  Hoarseness '[ ]'$  Dysphagia Cardiac: '[ ]'$  Chest pain/pressure, '[ ]'$  Shortness of breath at rest '[ ]'$  Shortness of breath with exertion, '[ ]'$  Atrial fibrillation or irregular heartbeat  Vascular: '[ ]'$  Pain in legs with walking, '[ ]'$  Pain in legs at rest, '[ ]'$  Pain in legs at night,  '[ ]'$  Non-healing ulcer, '[ ]'$  Blood clot in  vein/DVT,   Pulmonary: '[ ]'$  Home oxygen, '[ ]'$  Productive cough, '[ ]'$  Coughing up blood, '[ ]'$  Asthma,  '[ ]'$  Wheezing '[ ]'$  COPD Musculoskeletal:  '[ ]'$  Arthritis, '[ ]'$  Low back pain, '[ ]'$  Joint pain Hematologic: '[ ]'$  Easy Bruising, '[ ]'$  Anemia; '[ ]'$  Hepatitis Gastrointestinal: '[ ]'$  Blood in stool, '[ ]'$  Gastroesophageal Reflux/heartburn, Urinary: '[ ]'$  chronic Kidney disease, '[ ]'$  on HD - '[ ]'$  MWF or '[ ]'$  TTHS, '[ ]'$  Burning with urination, '[ ]'$  Difficulty urinating Skin: '[ ]'$  Rashes, '[ ]'$  Wounds Psychological: '[ ]'$  Anxiety, '[ ]'$  Depression  Physical Examination Vitals:   12/06/21 0355 12/06/21 0500 12/06/21 0540 12/06/21 0559  BP: 135/87  (!) 148/95 (!) 153/88  Pulse: (!) 105  (!) 107 (!) 105  Resp: (!) 25  (!) 23 18  Temp: 99 F (37.2 C)   99.3 F (37.4 C)  TempSrc: Axillary   Axillary  SpO2: 95%  100% 100%  Weight:  79.3 kg     Body mass index is 30.01 kg/m.  General:  WDWN in NAD HENT: WNL Eyes:does not open eyes Pulmonary: normal non-labored breathing , without Rales, rhonchi,  wheezing Cardiac: RRR, without  Murmurs, rubs or gallops; No carotid bruits Abdomen: soft, NT, no masses Skin: no rashes, ulcers noted;  no Gangrene , no cellulitis; no open wounds;     No active bleeding clean dressing placed with RN assistance  Musculoskeletal: no muscle wasting or atrophy; no edema Spontaneous movement  SENSATION: normal;     Significant Diagnostic Studies: CBC Lab Results  Component Value Date   WBC 12.5 (H) 12/06/2021   HGB 7.1 (L) 12/06/2021   HCT 20.8 (L) 12/06/2021   MCV 83.5 12/06/2021   PLT 90 (L) 12/06/2021    BMET    Component Value Date/Time   NA 139 12/06/2021 0432   NA 140 10/26/2014 1011   K 2.7 (LL) 12/06/2021 0432   K 3.8 10/26/2014 1011   CL 100 12/06/2021 0432   CL 101 10/26/2014 1011   CO2 26 12/06/2021 0432   CO2 34 (H) 10/26/2014 1011   GLUCOSE 102 (H) 12/06/2021 0432   GLUCOSE 104 (H) 10/26/2014 1011   BUN 29 (H) 12/06/2021 0432   BUN 12 10/26/2014  1011    CREATININE 3.02 (H) 12/06/2021 0432   CREATININE 0.72 10/26/2014 1011   CALCIUM 8.5 (L) 12/06/2021 0432   CALCIUM 9.9 10/26/2014 1011   GFRNONAA 16 (L) 12/06/2021 0432   GFRNONAA >60 10/26/2014 1011   GFRAA >60 10/26/2014 1011   Estimated Creatinine Clearance: 18.2 mL/min (A) (by C-G formula based on SCr of 3.02 mg/dL (H)).  COAG Lab Results  Component Value Date   INR 1.1 12/06/2021      ASSESSMENT/PLAN:  AKI with need for HD and placement of temp cath No active bleeding surrounding the Temp HD cath left neck No current need for vascular intervention.   Carla Gomez 12/06/2021 7:47 AM  VASCULAR STAFF ADDENDUM: I have independently interviewed and examined the patient. I agree with the above.  Continue local care to the neck. Please call if permanent dialysis access is needed.  Carla Aline. Stanford Breed, MD [facility] Phone Number: [phone] 12/06/2021 8:17 AM

## 2021-12-06 NOTE — Progress Notes (Signed)
Noted patient's left IJ was bleeding. IV consult done. RN and IV team at bedside checking the catheter site, there is blood clot around the site and slight oozing of blood. Slight swelling of neck is also noted that was not the same upon initial assessment. On call MD at beside also. Says he will call the vascular surgeon and ordered labs. Patient is still having seizure, Neurologist as bedside, instructed to give Ativan 2 mg. Will continue to monitor.

## 2021-12-06 NOTE — Progress Notes (Signed)
Patient's son Carla Gomez called and verbally consent for patient to receive blood transfusion. Witness by Sanjuana Mae, RN and Jenkins Rouge, RN

## 2021-12-06 NOTE — Consult Note (Signed)
Reason for Consult: Acute kidney injury Referring Physician: Verneita Griffes M.D. [facility])  HPI:  66 year old woman with past medical history significant for hypertension, dyslipidemia, chronic pain syndrome, fibromyalgia and migraine type headaches who was admitted to [facility] 1 week ago with altered mental status.  She was found to have significant acute kidney injury (BUN 123/creatinine 16.2) with hyponatremia (123) with mildly elevated CPK level of 1375; unfortunately baseline labs unavailable.  Renal ultrasound was negative for any obstruction or structural pathology and she had minimal improvement with intravenous fluids.  She was started on hemodialysis on 11/30/2021 with additional hemodialysis on 5/22 and 5/25.  She was transferred to [facility] yesterday for persistent altered mental status that is not thought to be due to nonconvulsive status epilepticus rather than solely uremia.  Overnight urine output charted was 400 cc and so far she has already had 1300 cc urine output.  Labs this morning show creatinine of 3.0/BUN of 29 (likely reflective of her dialysis yesterday rather than renal recovery).  She has hypokalemia of 2.7.  Previous testing for ANA, ANCA, hepatitis and HIV were negative.  Creatinine kinase level significantly better on recheck.  Past Medical History:  Diagnosis Date   Cancer ([place])    basal cell   Chronic fatigue    Chronic pain    Dental bridge present    permanent bottom right   Fibromyalgia    Hypertension    Migraine headache     Past Surgical History:  Procedure Laterality Date   ABDOMINAL HYSTERECTOMY     BREAST BIOPSY Left 09/26/14   lt bx/clip- benign   BREAST BIOPSY Left 11/02/14   benign   COLONOSCOPY WITH PROPOFOL N/A 12/21/2020   Procedure: COLONOSCOPY WITH PROPOFOL;  Surgeon: Lucilla Lame, MD;  Location: [facility];  Service: Endoscopy;  Laterality: N/A;   NASAL SINUS SURGERY      Family History  Problem Relation Age  of Onset   Breast cancer Maternal Aunt        mat great aunt    Social History:  reports that she has never smoked. She has never used smokeless tobacco. She reports that she does not drink alcohol and does not use drugs.  Allergies:  Allergies  Allergen Reactions   Citrus Other (See Comments)    Triggers migraines   Latex Other (See Comments)    Redness around mouth after dental procedures   Other Other (See Comments)    ALLERGENS: Exposure to Inhalants such as grasses, pollen, trees, mold, mildew, cat dander, cows.  REACTION: post nasal drainage, runny nose and/or chronic sinus infections    Medications: I have reviewed the patient's current medications. Scheduled:  sodium chloride   Intravenous Once   Chlorhexidine Gluconate Cloth  6 each Topical Daily   furosemide  20 mg Intravenous Once   metoprolol tartrate  2.5 mg Intravenous Q6H   sodium chloride flush  10-40 mL Intracatheter Q12H      Latest Ref Rng & Units 12/06/2021    4:32 AM 12/05/2021    4:21 AM 12/04/2021    4:38 AM  BMP  Glucose 70 - 99 mg/dL 102   101   98    BUN 8 - 23 mg/dL 29   58   48    Creatinine 0.44 - 1.00 mg/dL 3.02   5.35   5.29    Sodium 135 - 145 mmol/L 139   141   140    Potassium 3.5 - 5.1  mmol/L 2.7   3.4   3.2    Chloride 98 - 111 mmol/L 100   105   103    CO2 22 - 32 mmol/L '26   20   24    '$ Calcium 8.9 - 10.3 mg/dL 8.5   8.6   8.6        Latest Ref Rng & Units 12/06/2021    4:32 AM 12/04/2021    4:38 AM 12/01/2021    5:11 AM  CBC  WBC 4.0 - 10.5 K/uL 12.5   11.2   9.7    Hemoglobin 12.0 - 15.0 g/dL 7.1   8.7   9.3    Hematocrit 36.0 - 46.0 % 20.8   25.3   26.6    Platelets 150 - 400 K/uL 90   117   140        DG CHEST PORT 1 VIEW  Result Date: 12/06/2021 CLINICAL DATA:  Aspiration pneumonia EXAM: PORTABLE CHEST 1 VIEW COMPARISON:  12/01/2021 FINDINGS: Stable elevation the right hemidiaphragm. Similar lung aeration. Some residual patchy density in the left lung. Minimal  atelectasis/scarring right lung base. Stable cardiomediastinal contours. IMPRESSION: Similar lung aeration. Electronically Signed   By: Macy Mis M.D.   On: 12/06/2021 08:28   EEG adult  Result Date: 12/05/2021 Derek Jack, MD     12/05/2021  7:08 PM Routine EEG Report Anye Calysta Craigo is a 66 y.o. female with a history of altered mental status and twitching who is undergoing an EEG to evaluate for seizures. Report: This EEG was acquired with electrodes placed according to the International 10-20 electrode system (including Fp1, Fp2, F3, F4, C3, C4, P3, P4, O1, O2, T3, T4, T5, T6, A1, A2, Fz, Cz, Pz). The following electrodes were missing or displaced: none. 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 This activity was diffuse, at times more predominant on the left. There was no focal slowing. Photic stimulation and hyperventilation were not performed. No sleep architecture was identified. Patient was noted on video to have near continuous mouth twitching and intermittent twitches left and right body (not simultaneous) during the recording. 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 Patient will be administered IV ativan and keppra and transferred to [facility] for cEEG. Su Monks, MD [facility] [phone] If 7pm- 7am, please page neurology on call as listed in [facility].    Review of Systems  Unable to perform ROS: Mental status change  Blood pressure (!) 145/122, pulse 98, temperature 99.7 F (37.6 C), temperature source Axillary, resp. rate 18, weight 79.3 kg, SpO2 98 %. Physical Exam Constitutional:      Appearance: She is normal weight. She  is ill-appearing.  HENT:     Head: Normocephalic.     Comments: On EEG monitoring    Mouth/Throat:     Mouth: Mucous membranes are dry.     Pharynx: Oropharynx is clear.  Eyes:     Conjunctiva/sclera: Conjunctivae normal.  Cardiovascular:     Rate and Rhythm: Normal rate and regular rhythm.     Pulses: Normal pulses.     Heart sounds: Normal heart sounds.  Pulmonary:     Effort: Pulmonary effort is normal.     Breath sounds: Normal breath sounds.  Abdominal:  General: Abdomen is flat. Bowel sounds are normal. There is no distension.     Palpations: Abdomen is soft.     Tenderness: There is no abdominal tenderness.  Musculoskeletal:     Cervical back: Neck supple.     Right lower leg: Edema present.     Left lower leg: Edema present.     Comments: Trace ankle edema  Skin:    General: Skin is warm and dry.     Coloration: Skin is pale.  Neurological:     Mental Status: She is disoriented.    Assessment/Plan: 1.  Acute kidney injury: Unclear etiology but suspected to be possibly prerenal if she was truly having nonconvulsive status epilepticus prior to hospitalization as well limiting oral intake.  Sustained prerenal insult may have led to ischemic ATN explaining poor response to the fluids that she got earlier during hospitalization.  Overnight, she has had fairly decent urine output and labs this morning do not indicate need for dialysis.  We will hold off on this and continue to monitor daily labs for possible renal recovery or intervene with dialysis if needed. 2.  Hypokalemia: Likely secondary to dialysis yesterday/impaired intake in the setting of encephalopathy.  Status post replacement earlier today and awaiting repeat labs this afternoon. 3.  Nonconvulsive status epilepticus: Seen earlier by neurology currently on continuous EEG monitoring with ongoing treatment on Keppra, Depacon, Vimpat and as needed lorazepam. 4.  Aspiration pneumonia: Ongoing treatment with  Unasyn. 5.  Anemia: Possibly secondary to acute/critical illness although cannot entirely rule out the possibility of underlying chronic kidney disease of unknown severity/chronicity.  She does not have any overt blood loss and I will order for iron studies with labs tomorrow morning.  Tavaras Goody K. 12/06/2021, 2:16 PM

## 2021-12-06 NOTE — Progress Notes (Signed)
Assisted with transfer of pt from 3W14 to 3M12 per PCCM request. Pt transferred with RN x2 and RRT.

## 2021-12-06 NOTE — Consult Note (Addendum)
Neurology Consultation Reason for Consult: AMS c/f NCSE Requesting Physician: Kristopher Oppenheim  CC: AMS  History is obtained from: Chart review, called Delana Meyer (son, emergency contact at 12:20 AM x 2 attempts but there was no response and voicemail was not accepting messages )  HPI: Carla Gomez is a 66 y.o. female with a past medical history significant for hypertension, fibromyalgia, migraine headaches, chronic pain, chronic fatigue, basal cell cancer  She initially presented to [facility] hospital on 11/29/2021.  She had been noted to be shouting and confused by neighbors who had called EMS after having found her fallen and helping her back into a chair.  Per triage notes she had reported going through withdrawals and had not slept in 3 days and reported she was not feeling safe enough to sleep at home. EMS vitals 127/53 b/p, 98HR, 98% RA, cbg 127.   She was reported to be answering questions appropriately but slowly, stating that she may have fainted  On arrival to the ED she was found to have multiple metabolic abnormalities including a sodium of 123, and acute renal failure with a creatinine of 16.28 and BUN of 123 with a CK of 1375.  There was also concern for a left lung infiltrate inflammation versus infection.  She was afebrile  Renal ultrasound demonstrated chronic renal disease without atrophy or hydronephrosis.  Urine studies demonstrated concern for UTI, for which she was started on Rocephin. Per nephrological serological workup for etiology of her renal failure was unremarkable. Unfortunately given the patient lives alone and there was no ability to obtain baseline or timeframe for when her mentation began to change.  Initially there was felt to be some improvement with her mentation on 5/21 with dialysis which was started on 5/20.  There was improvement in her creatinine and she was felt to make adequate urine output however due to continued poor mentation with increasing drowsiness  and BUN rising additional dialysis (without ultrafiltration) was performed on 5/22. She was initially maintained on benzodiazepines, intermittent doses until 5/24  Notably UDS obtained prior to any administration of benzodiazepines in the ED was positive for benzos, opiates and tricyclics  On review of medication dispense reports recent fills included: Xanax 1 mg 120 pills for 30-day supply on 10/25/2021, which she has been feeling regularly every month since at least December Cyclobenzaprine 10 mg 3 times daily dispensed 11/11/2021 Hydrocodone/acetaminophen 10-325 milligrams 150 tablets for an 18-day supply dispensed 10/27/2021 Morphine 100 mg tablets dispensed 11/19/2021 Sumatriptan last dispensed 09/13/2021 Amlodipine dispensed 11/25/2021 for 90-day supply Rosuvastatin dispensed 10/18/2021 for a 90-day supply Thus possibly her last known well-enough to pick up medications was 5/15  ROS: Unable to obtain due to altered mental status.   Past Medical History:  Diagnosis Date   Cancer ([place])    basal cell   Chronic fatigue    Chronic pain    Dental bridge present    permanent bottom right   Fibromyalgia    Hypertension    Migraine headache    Past Surgical History:  Procedure Laterality Date   ABDOMINAL HYSTERECTOMY     BREAST BIOPSY Left 09/26/14   lt bx/clip- benign   BREAST BIOPSY Left 11/02/14   benign   COLONOSCOPY WITH PROPOFOL N/A 12/21/2020   Procedure: COLONOSCOPY WITH PROPOFOL;  Surgeon: Lucilla Lame, MD;  Location: [facility];  Service: Endoscopy;  Laterality: N/A;   NASAL SINUS SURGERY     Current Outpatient Medications  Medication Instructions   acetaminophen (TYLENOL) 650  mg, Oral, Every 6 hours PRN   Ampicillin-Sulbactam 3 g in sodium chloride 0.9 % 100 mL 3 g, Intravenous, Every 24 hours   heparin 5,000 Units, Subcutaneous, Every 8 hours   levETIRAcetam (KEPRRA) 500 mg, Intravenous, Every 24 hours   metoprolol tartrate (LOPRESSOR) 2.5 mg, Intravenous, Every 6  hours   ondansetron (ZOFRAN) 4 mg, Intravenous, Every 6 hours PRN   sodium chloride 0.9 % infusion 50 mLs, Intravenous, Continuous     Family History  Problem Relation Age of Onset   Breast cancer Maternal Aunt        mat great aunt    Social History:  reports that she has never smoked. She has never used smokeless tobacco. She reports that she does not drink alcohol and does not use drugs.   Exam: Current vital signs: There were no vitals taken for this visit. Vital signs in last 24 hours: Temp:  [98.7 F (37.1 C)-100 F (37.8 C)] 99.3 F (37.4 C) (05/25 2226) Pulse Rate:  [107-113] 110 (05/25 2226) Resp:  [17-32] 18 (05/25 2125) BP: (130-162)/(74-104) 148/88 (05/25 2226) SpO2:  [90 %-97 %] 95 % (05/25 2226) Weight:  [76.7 kg] 76.7 kg (05/25 1312)   Physical Exam  Constitutional: Appears pale, restless,  Psych: Intermittently combative, not cooperative Eyes: No scleral injection HENT: No oropharyngeal obstruction.  MSK: no obvious joint deformities.  Cardiovascular: Mildly tachycardic.  Perfusing extremities well. Respiratory: Effort normal, non-labored breathing GI: Soft.  No distension. There is no tenderness.  Skin: Warm dry and intact visible skin  Neuro: Mental Status: Patient is drowsy, but with stimulation briefly yells out some sensible words "please stop" but most of her speech is nonsensical/unintelligible.  She does not follow any commands.  She does not open eyes to noxious stimulation but does at times keep them open after they are open for her Cranial Nerves: II: Pupils are equal, round, unable to assess reactivity to light as she closes her eyes frequently III,IV, VI: She does orient to stimuli bilaterally but inconsistently V: Facial sensation is symmetric to eyelash brush VII: Facial movement is symmetric to grimace to noxious stimulation.  VIII: Intermittently orients to voice Remainder unable to assess given mental status Motor: Tone is notable  for paratonia.  At least 4/5 throughout when intermittently combative.  No obvious focal weakness.  Intermittent irregular twitching of the eyes, face, arms Sensory: Equally reactive to light tickle in all 4 extremities Deep Tendon Reflexes: 2+ and symmetric in the biceps Cerebellar: Coordination intact for attempting to remove her mitts, which she brings to her mouth and tries to pull off with her teeth as well as attempting to work them off with her hands Gait:  Deferred for patient's safety  I have reviewed labs in epic and the results pertinent to this consultation are:  Basic Metabolic Panel: Recent Labs  Lab 11/30/21 1300 11/30/21 1817 12/01/21 0511 12/02/21 0130 12/03/21 0327 12/04/21 0438 12/05/21 0421  NA 121* 123* 133* 131* 136 140 141  K 4.8 5.1 4.0 4.0 3.4* 3.2* 3.4*  CL 85* 88* 95* 97* 101 103 105  CO2 15* 15* 20* 16* 22 24 20*  GLUCOSE 111* 99 88 94 99 98 101*  BUN 130* 125* 67* 76* 39* 48* 58*  CREATININE 15.21* 15.49* 9.01* 9.01* 4.82* 5.29* 5.35*  CALCIUM 8.0* 8.2* 8.2* 8.4* 8.2* 8.6* 8.6*  PHOS 11.0* 10.8*  --   --   --   --   --     CBC: Recent  Labs  Lab 11/29/21 1832 11/30/21 0423 11/30/21 1817 12/01/21 0511 12/04/21 0438  WBC 8.9 8.3 9.7 9.7 11.2*  HGB 9.9* 9.0* 9.0* 9.3* 8.7*  HCT 28.4* 25.4* 25.8* 26.6* 25.3*  MCV 82.6 82.5 82.7 81.3 82.7  PLT 155 143* 134* 140* 117*    Coagulation Studies: No results for input(s): LABPROT, INR in the last 72 hours.    I have reviewed the images obtained:  5/19 Head CT personally reviewed, agree with radiology:  1.  No acute intracranial process. 2.  No acute fracture or traumatic listhesis in the cervical spine. 3. Patchy opacities in the peripheral left upper lobe, which are nonspecific but could be infectious or inflammatory. Consider CT chest for further evaluation if clinically indicated.  Routine EEG 5/25: Impression and clinical correlation: This EEG was obtained while awake and drowsy and is  abnormal due to rhythmic high-voltage primarily theta frequencies admixed with continuous sharp wave discharges concerning for nonconvulsive status epilepticus.    Impression: This is a 66 year old woman with past medical history significant for chronic pain on chronic opiates, chronic benzodiazepine use, fibromyalgia, hyperlipidemia.  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  Etiology of her seizures include metabolic derangements on presentation as well as possible contribution from benzodiazepine withdrawal. Given elevated ESR need to consider possibility of a neuroinflammatory process, for which MRI brain and LP will be helpful in further evaluation; do not think this would be acute bacterial meningitis given her clinical course and overall do not think risks of acyclovir outweigh benefits given her renal function and since she has been afebrile herpetic encephalitis seems less likely.   Acute administration of benzos: s/p ativan 2 mg at 1925, repeat ativan 2 mg at 1 AM to assess response; 10 min later no significant change and patient's respiratory status and mental status are stable, after another 2 mg at ~1:25 AM she was sleepier with some improvement in EEG  Recommendations: - cEEG - Now s/p 6 mg ativan (2 at [facility] at 1925, 2 mg here at ~1 AM and 1:25 AM) - Keppra 1500 mg load completed at 20:20, then 500 mg q24 hrs with 250 mg post each iHD session. Will need to adjust if CrCl improves to >15 Estimated Creatinine Clearance: 10.1 mL/min (A) (by C-G formula based on SCr of 5.35 mg/dL (H)).  - EKG to assess for safety prior to vimpat demonstrated Qtc 513 but no heart block - Vimpat 200 mg loading dose followed by 100 mg BID with a supplemental dose of 50 mg after each iHD session - If patient's mental status does not improve overnight, will likely need sedation for MRI and LP tomorrow as she is too combative to  complete these procedures at this time (EEG will need to be removed for MRI and then restarted) - Repeat ESR - Goal normal electrolytes, appreciate primary team attention to repletion - Neurology will continue to follow   [place] [phone] Available 7 PM to 7 AM, outside of these hours please call Neurologist on call as listed on Amion.

## 2021-12-06 NOTE — Progress Notes (Signed)
IVT consult note:  Consulted for [facility] non tunneled HD cath bleeding. Arrived at bedside at 0452. Pt on seizure precaution,neuro monitoring. Removed Current dressing; noted to be  bleeding. Removed dressing to identify source of bleeding and noted coming from the catheter puncture site. Slow oozing of fresh blood and neck noted to be slightly swollen. Charge nurse stated pt arrived from [facility] and dsg at that time was clean and dry and no bleeding was noticed.Dr. Hal Hope came to round and at bedside & seen status of HD cath before new dressing was applied. Ok'd use of thrombi pad. MD stated Vascular will be informed. Neurologist also came to assess patient. New Dressing applied w/ thrombi pad. Pigtail cath flushing good w/ saline but no blood return noted. All 3 lumens intact and caps in place.Primary care nurse Lanier Prude RN advise continued monitoring of site while awaiting for Vascular consult for HD cath  assessment.

## 2021-12-07 ENCOUNTER — Inpatient Hospital Stay (HOSPITAL_COMMUNITY): Payer: Medicare Other

## 2021-12-07 DIAGNOSIS — G9341 Metabolic encephalopathy: Secondary | ICD-10-CM | POA: Diagnosis not present

## 2021-12-07 DIAGNOSIS — G40901 Epilepsy, unspecified, not intractable, with status epilepticus: Secondary | ICD-10-CM | POA: Diagnosis not present

## 2021-12-07 LAB — FERRITIN: Ferritin: 702 ng/mL — ABNORMAL HIGH (ref 11–307)

## 2021-12-07 LAB — TYPE AND SCREEN
ABO/RH(D): O POS
Antibody Screen: NEGATIVE
Unit division: 0

## 2021-12-07 LAB — GLUCOSE, CAPILLARY
Glucose-Capillary: 91 mg/dL (ref 70–99)
Glucose-Capillary: 102 mg/dL — ABNORMAL HIGH (ref 70–99)
Glucose-Capillary: 116 mg/dL — ABNORMAL HIGH (ref 70–99)
Glucose-Capillary: 117 mg/dL — ABNORMAL HIGH (ref 70–99)
Glucose-Capillary: 130 mg/dL — ABNORMAL HIGH (ref 70–99)
Glucose-Capillary: 158 mg/dL — ABNORMAL HIGH (ref 70–99)

## 2021-12-07 LAB — RENAL FUNCTION PANEL
Albumin: 3.1 g/dL — ABNORMAL LOW (ref 3.5–5.0)
Anion gap: 13 (ref 5–15)
BUN: 30 mg/dL — ABNORMAL HIGH (ref 8–23)
CO2: 27 mmol/L (ref 22–32)
Calcium: 8.5 mg/dL — ABNORMAL LOW (ref 8.9–10.3)
Chloride: 102 mmol/L (ref 98–111)
Creatinine, Ser: 3.16 mg/dL — ABNORMAL HIGH (ref 0.44–1.00)
GFR, Estimated: 15 mL/min — ABNORMAL LOW (ref >60)
Glucose, Bld: 127 mg/dL — ABNORMAL HIGH (ref 70–99)
Phosphorus: 3.2 mg/dL (ref 2.5–4.6)
Potassium: 3.1 mmol/L — ABNORMAL LOW (ref 3.5–5.1)
Sodium: 142 mmol/L (ref 135–145)

## 2021-12-07 LAB — CBC WITH DIFFERENTIAL/PLATELET
Abs Immature Granulocytes: 0.17 10*3/uL — ABNORMAL HIGH (ref 0.00–0.07)
Basophils Absolute: 0 10*3/uL (ref 0.0–0.1)
Basophils Relative: 0 %
Eosinophils Absolute: 0.7 10*3/uL — ABNORMAL HIGH (ref 0.0–0.5)
Eosinophils Relative: 4 %
HCT: 24.8 % — ABNORMAL LOW (ref 36.0–46.0)
Hemoglobin: 8.7 g/dL — ABNORMAL LOW (ref 12.0–15.0)
Immature Granulocytes: 1 %
Lymphocytes Relative: 7 %
Lymphs Abs: 1.2 10*3/uL (ref 0.7–4.0)
MCH: 28.8 pg (ref 26.0–34.0)
MCHC: 35.1 g/dL (ref 30.0–36.0)
MCV: 82.1 fL (ref 80.0–100.0)
Monocytes Absolute: 1 10*3/uL (ref 0.1–1.0)
Monocytes Relative: 6 %
Neutro Abs: 13.6 10*3/uL — ABNORMAL HIGH (ref 1.7–7.7)
Neutrophils Relative %: 82 %
Platelets: 86 10*3/uL — ABNORMAL LOW (ref 150–400)
RBC: 3.02 MIL/uL — ABNORMAL LOW (ref 3.87–5.11)
RDW: 15 % (ref 11.5–15.5)
WBC: 16.6 10*3/uL — ABNORMAL HIGH (ref 4.0–10.5)
nRBC: 0 % (ref 0.0–0.2)

## 2021-12-07 LAB — BASIC METABOLIC PANEL
Anion gap: 12 (ref 5–15)
BUN: 30 mg/dL — ABNORMAL HIGH (ref 8–23)
CO2: 27 mmol/L (ref 22–32)
Calcium: 8.6 mg/dL — ABNORMAL LOW (ref 8.9–10.3)
Chloride: 103 mmol/L (ref 98–111)
Creatinine, Ser: 3.2 mg/dL — ABNORMAL HIGH (ref 0.44–1.00)
GFR, Estimated: 15 mL/min — ABNORMAL LOW (ref >60)
Glucose, Bld: 131 mg/dL — ABNORMAL HIGH (ref 70–99)
Potassium: 2.8 mmol/L — ABNORMAL LOW (ref 3.5–5.1)
Sodium: 142 mmol/L (ref 135–145)

## 2021-12-07 LAB — MAGNESIUM: Magnesium: 2.1 mg/dL (ref 1.7–2.4)

## 2021-12-07 LAB — BPAM RBC
Blood Product Expiration Date: 202305302359
ISSUE DATE / TIME: 202305261424
Unit Type and Rh: 9500

## 2021-12-07 LAB — IRON AND TIBC
Iron: 97 ug/dL (ref 28–170)
Saturation Ratios: 32 % — ABNORMAL HIGH (ref 10.4–31.8)
TIBC: 305 ug/dL (ref 250–450)
UIBC: 208 ug/dL

## 2021-12-07 LAB — POTASSIUM: Potassium: 3.3 mmol/L — ABNORMAL LOW (ref 3.5–5.1)

## 2021-12-07 MED ORDER — OLANZAPINE 10 MG IM SOLR
5.0000 mg | Freq: Once | INTRAMUSCULAR | Status: AC | PRN
  Administered 2021-12-07: 5 mg via INTRAMUSCULAR
  Filled 2021-12-07: qty 10

## 2021-12-07 MED ORDER — POTASSIUM CHLORIDE 10 MEQ/100ML IV SOLN
10.0000 meq | INTRAVENOUS | Status: AC
  Administered 2021-12-07 (×2): 10 meq via INTRAVENOUS
  Filled 2021-12-07 (×2): qty 100

## 2021-12-07 MED ORDER — DARBEPOETIN ALFA 100 MCG/0.5ML IJ SOSY
100.0000 ug | PREFILLED_SYRINGE | INTRAMUSCULAR | Status: DC
  Administered 2021-12-07 – 2021-12-14 (×2): 100 ug via SUBCUTANEOUS
  Filled 2021-12-07 (×2): qty 0.5

## 2021-12-07 MED ORDER — OXYCODONE HCL 5 MG PO TABS
5.0000 mg | ORAL_TABLET | Freq: Four times a day (QID) | ORAL | Status: DC
  Administered 2021-12-07 – 2021-12-09 (×7): 5 mg
  Filled 2021-12-07 (×7): qty 1

## 2021-12-07 MED ORDER — LABETALOL HCL 5 MG/ML IV SOLN
10.0000 mg | INTRAVENOUS | Status: DC | PRN
  Administered 2021-12-07 (×2): 10 mg via INTRAVENOUS
  Filled 2021-12-07 (×2): qty 4

## 2021-12-07 MED ORDER — DEXTROSE IN LACTATED RINGERS 5 % IV SOLN: INTRAVENOUS | Status: AC

## 2021-12-07 MED ORDER — SODIUM CHLORIDE 0.9 % IV SOLN
3.0000 g | Freq: Two times a day (BID) | INTRAVENOUS | Status: DC
  Administered 2021-12-07 – 2021-12-08 (×2): 3 g via INTRAVENOUS
  Filled 2021-12-07 (×2): qty 8

## 2021-12-07 MED ORDER — STERILE WATER FOR INJECTION IJ SOLN
INTRAMUSCULAR | Status: AC
  Filled 2021-12-07: qty 10

## 2021-12-07 MED ORDER — POTASSIUM CHLORIDE 10 MEQ/50ML IV SOLN: 10.0000 meq | INTRAVENOUS | Status: DC

## 2021-12-07 MED ORDER — METOPROLOL TARTRATE 5 MG/5ML IV SOLN
5.0000 mg | Freq: Four times a day (QID) | INTRAVENOUS | Status: DC
  Administered 2021-12-07 – 2021-12-10 (×8): 5 mg via INTRAVENOUS
  Filled 2021-12-07 (×10): qty 5

## 2021-12-07 MED ORDER — POTASSIUM CHLORIDE 10 MEQ/100ML IV SOLN
10.0000 meq | INTRAVENOUS | Status: AC
  Administered 2021-12-07 (×4): 10 meq via INTRAVENOUS
  Filled 2021-12-07 (×4): qty 100

## 2021-12-07 NOTE — Plan of Care (Cosign Needed)
Neurology Consult Plan of Care   Patient being followed by neurology on consult for: AMS   In brief  Carla Gomez  is a 66 y.o. female with a past medical history significant for hypertension, fibromyalgia, migraine headaches, chronic pain, chronic fatigue, basal cell cancer who neurology was asked to consult on for AMS. She initially presented to [facility] hospital on 11/29/2021. She had been noted to be shouting and confused by neighbors who had called EMS after having found her fallen and helping her back into a chair.    Per triage notes she had reported going through withdrawals and had not slept in 3 days and reported she was not feeling safe enough to sleep at home.On arrival to the ED she was found to have multiple metabolic abnormalities including a sodium of 123, and acute renal failure with a creatinine of 16.28 and BUN of 123 with a CK of 1375.  There was also concern for a left lung infiltrate inflammation versus infection.  She was afebrile. Renal ultrasound demonstrated chronic renal disease without atrophy or hydronephrosis.  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  Etiology of her seizures include metabolic derangements on presentation as well as possible contribution from benzodiazepine withdrawal.   Status epilepticus/ seizure work up completed including:  EEG-Continuous slow, generalized suggestive of severe diffuse encephalopathy, nonspecific to etiology. No seizures or epileptiform discharges were seen throughout the recording. 2. EKG to assess for safety prior to vimpat demonstrated Qtc 513 but no heart block 3. ESR 106 a week ago and 5/26 was improved at 89    Recommendations:  Last seizure 12/05/21 however remains encephalopathic, will discontinue EEG.  Continue Keppra 500 mg Q 24 hrs with a supplemental dose of 250 mg after each HD session Continue Vimpat 100 mg BID and a supplemental dose of 50 mg after each HD  Continue to correct  metabolic and infectious derangements Goal normal electrolytes, appreciate primary team attention to repletion   Neurology will sign off with above recommendations. Call back with any new neurological deficits or concerns.      Aimee Timmons LRonnald Ramp, AGNP-BC  12/07/2021 / 10:06 AM

## 2021-12-07 NOTE — Progress Notes (Addendum)
[facility] Progress Note Patient Name: Carla Gomez DOB: October 22, 1955 MRN: [id number]   Date of Service  12/07/2021  HPI/Events of Note  Noted K is 3.3 from 2.8 (making great urine and creat is unchanged at 3.2) Also very hypertensive. Has standing Metoprolol ordered and PRN labetalol ordered  eICU Interventions  20 meq IV Kcl. Recheck labs at 2 am - CBC renal panel and mag Adjusted Metoprolol dose in hopes to get labetalol requirement lower  D./w RN      Intervention Category Major Interventions: Hypertension - evaluation and management  Margaretmary Lombard 12/07/2021, 10:43 PM  Addendum at 11:45 pm Asked to order A BG due to intermittent shallow breaths No distress. O2 sat 95 ABG ordered   Addendum at 5:20 am Noted BMP and mag 40 meq IV Kcl and 2 gram mag, with follow up K ordered at 1130 am

## 2021-12-07 NOTE — Progress Notes (Signed)
PHARMACY NOTE:  ANTIMICROBIAL RENAL DOSAGE ADJUSTMENT  Current antimicrobial regimen includes a mismatch between antimicrobial dosage and estimated renal function.  As per policy approved by the Pharmacy & Therapeutics and Medical Executive Committees, the antimicrobial dosage will be adjusted accordingly.  Current antimicrobial dosage:  Unasyn 3gm IV q24hr  Indication: Aspiration Pneumonia  Renal Function:  Estimated Creatinine Clearance: 16.9 mL/min (A) (by C-G formula based on SCr of 3.16 mg/dL (H)). '[x]'$      On intermittent HD, as needed (Has not required iHD for several days and is having reasonable UOP)    Antimicrobial dosage has been changed to:  Unasyn 3 gm Iv q12hr  Thank you for allowing pharmacy to be a part of this patient's care.  Alanda Slim, PharmD, [facility] Clinical Pharmacist Please see AMION for all Pharmacists' Contact Phone Numbers 12/07/2021, 12:57 PM

## 2021-12-07 NOTE — Progress Notes (Signed)
NAME:  Carla Gomez, MRN:  [id number], DOB:  1956-03-31, LOS: 2 ADMISSION DATE:  12/05/2021, CONSULTATION DATE:  5/26 REFERRING MD:  Hal Hope CHIEF COMPLAINT:  AMS   History of Present Illness:  Patient is encephalopathic and/or intubated. Therefore history has been obtained from chart review.    Carla Gomez, is a 66 y.o. female, who presented to the [facility] ED on 5/19 with a chief complaint of altered mental status.  She was found to have an AKI and rhabdomyolysis.   They have a pertinent past medical history of fibromyalgia, chronic pain with migraines, hypertension   She was admitted to [facility] by the hospitalist.  She was transferred to [facility] on 5/25 for persistent altered mental status thought to be nonconvulsive status epilepticus.   PCCM was consulted for worsening mental status, agitation.  Past Medical History:  fibromyalgia, chronic pain with migraines, hypertension  Significant Hospital Events:  5/19-5/25 Admitted to [place] [facility] for [place]  5/26 transferred to [facility]   Consults:  Neuro  Significant Diagnostic Tests:  5/26 long term EEG   Micro Data:  Urine culture 5/22 no growth   Antimicrobials:  Unasyn 5/25>   Interim History / Subjective:  Trasnfered to [facility] from [place] [facility]. Given zyprexa x1 overnight and started on D5% LR. Remains encephalopathic this morning.  Objective   Blood pressure (!) 151/107, pulse 100, temperature 98.9 F (37.2 C), temperature source Axillary, resp. rate 19, height '5\' 4"'$  (1.626 m), weight 74.7 kg, SpO2 100 %.        Intake/Output Summary (Last 24 hours) at 12/07/2021 1245 Last data filed at 12/07/2021 0700 Gross per 24 hour  Intake 2326.46 ml  Output 4650 ml  Net -2323.54 ml   Filed Weights   12/06/21 0500 12/06/21 2300 12/07/21 0456  Weight: 79.3 kg 74.7 kg 74.7 kg    Examination: General: in bed, restless, NAD, mumbling nonsensically  HENT: /AT, scelarae anicteric, PERRLA  Lungs: CTA  bilaterally, normal work of breathing  Cardiovascular: RRR, no murmurs rubs or gallops Abdomen: soft, non-tender, bowel sounds present  Extremities: warm, no edema  Neuro: awake, not oriented to person, place or time, opens eyes to verbal stimuli, following some simple commands (lifts left arm), nonsensical mumbling  Skin: warm and dry  Labs- WBC 16.6 Hgb 8.7 Plts 86 Na 142 K 3.1 Cr 3.16 Ferritin 702  Resolved Hospital Problem list     Assessment & Plan:   Nonconvulsive status epilepticus Altered mental status, suspect secondary to SE vs hypoglycemia Concerns per nursing staff the patient being GCS of 8 on floor.  GCS 11 on exam.  Patient with hypoglycemia.  Could be contributing to issues with mental status. -Transfer to ICU for closer monitoring -EEG per neurology -AEDs per neurology.  On Vimpat, Keppra, valproate. -CBG checks -Continue neuroprotective measures- normothermia, euglycemia, HOB greater than 30, head in neutral alignment, normocapnia, normoxia.  -PRN ativan   Aspiration pneumonia secondary nonconvulsive status epilepticus -Continue Unasyn -Monitor fever WBC curve -Pulm toilet as able   Hypoglycemia Patient NPO -continue q1h CBG -Continue D5 LR  -PRN dextrose if needed   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 Rhabdomyolysis-resolved, Secondary to status epilepticus Hypokalemia, suspect secondary to HD -Patient with improving urine output, holding off on dialysis at this time per neph -Nephrology following -On D5LR  -repleting K    Normocytic anemia Status post 1 unit PRBC -Transfuse PRBC if HBG less than 7 -Obtain AM CBC to trend H&H -Monitor for signs of bleeding  Best practice (evaluated daily)  Diet: NPO Pain/Anxiety/Delirium protocol (if indicated): n/a VAP protocol (if indicated): n/a DVT prophylaxis: SCDs GI prophylaxis: PPI Glucose control: none Mobility: bedrest Disposition:ICU Code Status: Full code   Labs   CBC: Recent Labs  Lab  11/30/21 1817 12/01/21 0511 12/04/21 0438 12/06/21 0432 12/07/21 0202  WBC 9.7 9.7 11.2* 12.5* 16.6*  NEUTROABS  --   --   --  10.0* 13.6*  HGB 9.0* 9.3* 8.7* 7.1* 8.7*  HCT 25.8* 26.6* 25.3* 20.8* 24.8*  MCV 82.7 81.3 82.7 83.5 82.1  PLT 134* 140* 117* 90* 86*    Basic Metabolic Panel: Recent Labs  Lab 11/30/21 1300 11/30/21 1817 12/01/21 0511 12/04/21 0438 12/05/21 0421 12/06/21 0432 12/06/21 1450 12/07/21 0201 12/07/21 0519  NA 121* 123*   < > 140 141 139 135 142  --   K 4.8 5.1   < > 3.2* 3.4* 2.7* 2.8* 3.1*  --   CL 85* 88*   < > 103 105 100 98 102  --   CO2 15* 15*   < > 24 20* '26 27 27  '$ --   GLUCOSE 111* 99   < > 98 101* 102* 106* 127*  --   BUN 130* 125*   < > 48* 58* 29* 31* 30*  --   CREATININE 15.21* 15.49*   < > 5.29* 5.35* 3.02* 3.26* 3.16*  --   CALCIUM 8.0* 8.2*   < > 8.6* 8.6* 8.5* 8.2* 8.5*  --   MG  --   --   --   --   --  1.9 1.7  --  2.1  PHOS 11.0* 10.8*  --   --   --   --   --  3.2  --    < > = values in this interval not displayed.   GFR: Estimated Creatinine Clearance: 16.9 mL/min (A) (by C-G formula based on SCr of 3.16 mg/dL (H)). Recent Labs  Lab 12/01/21 0511 12/04/21 0438 12/06/21 0432 12/07/21 0202  WBC 9.7 11.2* 12.5* 16.6*  LATICACIDVEN  --   --  0.8  --     Liver Function Tests: Recent Labs  Lab 11/30/21 1300 11/30/21 1817 12/06/21 0432 12/06/21 1450 12/07/21 0201  AST 16  --  55* 47*  --   ALT 23  --  43 41  --   ALKPHOS 84  --  90 85  --   BILITOT 1.1  --  1.0 0.9  --   PROT 6.9  --  5.9* 6.1*  --   ALBUMIN 3.3* 3.4* 2.9* 3.0* 3.1*   No results for input(s): LIPASE, AMYLASE in the last 168 hours. Recent Labs  Lab 12/06/21 0432  AMMONIA <10    ABG No results found for: PHART, PCO2ART, PO2ART, HCO3, TCO2, ACIDBASEDEF, O2SAT   Coagulation Profile: Recent Labs  Lab 12/06/21 0554  INR 1.1    Cardiac Enzymes: Recent Labs  Lab 11/30/21 1300 12/01/21 0511 12/02/21 0130 12/03/21 0327 12/06/21 0432   CKTOTAL 732* 437* 246* 129 89    HbA1C: Hgb A1c MFr Bld  Date/Time Value Ref Range Status  11/30/2021 01:00 PM 5.7 (H) 4.8 - 5.6 % Final    Comment:    (NOTE) Pre diabetes:          5.7%-6.4%  Diabetes:              >6.4%  Glycemic control for   <7.0% adults with diabetes     CBG: Recent  Labs  Lab 12/06/21 2251 12/06/21 2320 12/07/21 0103 12/07/21 0326 12/07/21 0516  GLUCAP 64* 114* 91 102* 116*    Review of Systems:   Unable to obtain  Past Medical History:  She,  has a past medical history of Cancer ([place]), Chronic fatigue, Chronic pain, Dental bridge present, Fibromyalgia, Hypertension, and Migraine headache.   Surgical History:   Past Surgical History:  Procedure Laterality Date   ABDOMINAL HYSTERECTOMY     BREAST BIOPSY Left 09/26/14   lt bx/clip- benign   BREAST BIOPSY Left 11/02/14   benign   COLONOSCOPY WITH PROPOFOL N/A 12/21/2020   Procedure: COLONOSCOPY WITH PROPOFOL;  Surgeon: Lucilla Lame, MD;  Location: [place];  Service: Endoscopy;  Laterality: N/A;   NASAL SINUS SURGERY       Social History:   reports that she has never smoked. She has never used smokeless tobacco. She reports that she does not drink alcohol and does not use drugs.   Family History:  Her family history includes Breast cancer in her maternal aunt.   Allergies Allergies  Allergen Reactions   Citrus Other (See Comments)    Triggers migraines   Latex Other (See Comments)    Redness around mouth after dental procedures   Other Other (See Comments)    ALLERGENS: Exposure to Inhalants such as grasses, pollen, trees, mold, mildew, cat dander, cows.  REACTION: post nasal drainage, runny nose and/or chronic sinus infections     Home Medications  Prior to Admission medications   Medication Sig Start Date End Date Taking? Authorizing Provider  amLODipine (NORVASC) 5 MG tablet Take 5 mg by mouth daily.   Yes [provider]  Cholecalciferol (VITAMIN D) 50  MCG (2000 UT) CAPS Take 2,000 Units by mouth daily.   Yes [provider]  cyclobenzaprine (FLEXERIL) 10 MG tablet Take 10 mg by mouth daily.   Yes [provider]  morphine (MS CONTIN) 100 MG 12 hr tablet Take 100 mg by mouth daily.   Yes [provider]  Multiple Vitamin (MULTIVITAMIN WITH MINERALS) TABS tablet Take 1 tablet by mouth daily.   Yes [provider]  naloxone (NARCAN) nasal spray 4 mg/0.1 mL Place 1 spray into the nose See admin instructions. Place one spray in the nare as needed for opioid reversal.  May repeat once in the opposite nare if needed. 09/28/21  Yes [provider]  rosuvastatin (CRESTOR) 20 MG tablet Take 20 mg by mouth daily at 6 PM.   Yes [provider]  SUMAtriptan (IMITREX) 100 MG tablet Take 100 mg by mouth every 2 (two) hours as needed for migraine. May repeat in 2 hours if headache persists or recurs.   Yes [provider]  acetaminophen (TYLENOL) 325 MG tablet Take 2 tablets (650 mg total) by mouth every 6 (six) hours as needed for mild pain (or Fever >/= 101). 12/05/21   Loletha Grayer, MD  Ampicillin-Sulbactam 3 g in sodium chloride 0.9 % 100 mL Inject 3 g into the vein daily. 12/06/21   Loletha Grayer, MD  heparin 5000 UNIT/ML injection Inject 1 mL (5,000 Units total) into the skin every 8 (eight) hours. 12/05/21   Loletha Grayer, MD  levETIRAcetam (KEPRRA) 500 MG/100ML SOLN Inject 100 mLs (500 mg total) into the vein daily. 12/06/21   Loletha Grayer, MD  metoprolol tartrate (LOPRESSOR) 5 MG/5ML SOLN injection Inject 2.5 mLs (2.5 mg total) into the vein every 6 (six) hours. 12/06/21   Loletha Grayer, MD  ondansetron (ZOFRAN) 4 MG/2ML SOLN injection Inject 2 mLs (4 mg total) into the vein every 6 (six) hours as needed for nausea. 12/05/21   Wieting, Richard, MD  sodium chloride 0.9 % infusion Inject 50 mLs into the vein continuous. 12/05/21   Loletha Grayer, MD     [place], DO PGY-3 IMTS

## 2021-12-07 NOTE — Procedures (Signed)
Patient Name: Carla Gomez  MRN: [id number]  Epilepsy Attending: Lora Havens  Referring Physician/Provider: Lorenza Chick, MD Duration: 12/06/2021 2301 to 12/07/2021 2301   Patient history: 66 year old woman with past medical history significant for chronic pain on chronic opiates, chronic benzodiazepine use, fibromyalgia, hyperlipidemia.  She has presented with acute renal failure of unknown etiology now with nonconvulsive status epilepticus.  EEG to evaluate for seizure.   Level of alertness:  lethargic    AEDs during EEG study: Keppra, Vimpat, Depakote   Technical aspects: This EEG study was done with scalp electrodes positioned according to the 10-20 International system of electrode placement. Electrical activity was acquired at a sampling rate of '500Hz'$  and reviewed with a high frequency filter of '70Hz'$  and a low frequency filter of '1Hz'$ . EEG data were recorded continuously and digitally stored.    Description: No clear posterior dominant rhythm was seen.  EEG showed continuous generalized polymorphic sharply contoured predominantly 5 to 8 Hz theta and alpha activity admixed with intermittent generalized 2 to 3 Hz delta slowing.    Patient was noted to have subtle whole body jerking, more prominent when awake/stimulated. Concomitant eeg showed high amplitude sharply contoured 3-'6hz'$  theta-delta slowing without definite evolution.    Hyperventilation and photic stimulation were not performed.      ABNORMALITY - Continuous slow, generalized   IMPRESSION: This study is suggestive of severe diffuse encephalopathy, nonspecific to etiology. No seizures or epileptiform discharges were seen throughout the recording.   Patient was noted to have subtle whole body jerks, more prominent when awake/stimulated without concomitant eeg change. These episodes were most likely not epileptic. Stimulation induced myoclonus could have similar appearance.    Carla Gomez Carla Gomez

## 2021-12-07 NOTE — Progress Notes (Signed)
Patient ID: Carla Gomez, female   DOB: 1956-03-27, 66 y.o.   MRN: [id number] [facility] Progress Note   Assessment/ Plan:   1.  Acute kidney injury: Unclear etiology but suspected to be possibly sustained prerenal evolving into ATN associated with nonconvulsive status epilepticus prior to hospitalization as well limiting oral intake.  Polyuric overnight in response to furosemide challenge and some improvement of renal function noted on labs earlier this morning. I will repeat labs again this afternoon and continue to follow urine output-based on her volume assessment, would not recommend repeat furosemide unless she has clear volume overload. 2.  Hypokalemia: Secondary to impaired intake in the setting of encephalopathy and previous losses from dialysis and diuresis/polyuria.  Ongoing replacement and we will recheck again this afternoon. 3.  Nonconvulsive status epilepticus: Seen earlier by neurology currently on continuous EEG monitoring with ongoing treatment on Keppra, Depacon, Vimpat and as needed lorazepam. 4.  Aspiration pneumonia: Ongoing treatment with Unasyn. 5.  Anemia: Possibly from blood losses around her left IJ dialysis catheter and secondary to acute/critical illness although cannot entirely rule out the possibility of underlying chronic kidney disease of unknown severity/chronicity.  Her iron stores are adequate, will give ESA dose.   Subjective:   Transferred to ICU overnight for closer neurological monitoring   Objective:   BP (!) 151/107   Pulse 100   Temp 98.8 F (37.1 C) (Axillary)   Resp 19   Ht '5\' 4"'$  (1.626 m)   Wt 74.7 kg   SpO2 100%   BMI 28.27 kg/m   Intake/Output Summary (Last 24 hours) at 12/07/2021 0827 Last data filed at 12/07/2021 0700 Gross per 24 hour  Intake 2297.13 ml  Output 4650 ml  Net -2352.87 ml   Weight change: -4.6 kg  Physical Exam: Gen: On continuous EEG monitor, unresponsive to calling out her name and withdraws to  pain CVS: Pulse regular rhythm, normal rate, S1 and S2 normal Resp: Coarse breath sounds bilaterally, no distinct rales/rhonchi Abd: Soft, obese, nontender, bowel sounds normal Ext: Trace ankle edema  Imaging: DG CHEST PORT 1 VIEW  Result Date: 12/06/2021 CLINICAL DATA:  Aspiration pneumonia EXAM: PORTABLE CHEST 1 VIEW COMPARISON:  12/01/2021 FINDINGS: Stable elevation the right hemidiaphragm. Similar lung aeration. Some residual patchy density in the left lung. Minimal atelectasis/scarring right lung base. Stable cardiomediastinal contours. IMPRESSION: Similar lung aeration. Electronically Signed   By: Macy Mis M.D.   On: 12/06/2021 08:28   EEG adult  Result Date: 12/05/2021 Derek Jack, MD     12/05/2021  7:08 PM Routine EEG Report Carla Gomez is a 66 y.o. female with a history of altered mental status and twitching who is undergoing an EEG to evaluate for seizures. Report: This EEG was acquired with electrodes placed according to the International 10-20 electrode system (including Fp1, Fp2, F3, F4, C3, C4, P3, P4, O1, O2, T3, T4, T5, T6, A1, A2, Fz, Cz, Pz). The following electrodes were missing or displaced: none. 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 This activity was diffuse, at times more predominant on the left. There was no focal slowing. Photic stimulation and hyperventilation were not performed. No sleep architecture was identified. Patient was noted on video to have near continuous mouth twitching and intermittent twitches left and right body (not simultaneous) during the recording. Impression and clinical correlation: This EEG was obtained while awake and drowsy and is abnormal  due to rhythmic high-voltage primarily theta frequencies admixed with continuous sharp wave discharges concerning for nonconvulsive status epilepticus. Patient  will be administered IV ativan and keppra and transferred to [facility] for cEEG. Su Monks, MD [facility] [phone] If 7pm- 7am, please page neurology on call as listed in [place].   Overnight EEG with video  Result Date: 12/06/2021 Lora Havens, MD     12/07/2021  8:27 AM Patient Name: Carla Gomez MRN: [id number] Epilepsy Attending: Lora Havens Referring Physician/Provider: Lorenza Chick, MD Duration: 12/05/2021 2301 to 12/06/2021 2301 Patient history: 66 year old woman with past medical history significant for chronic pain on chronic opiates, chronic benzodiazepine use, fibromyalgia, hyperlipidemia.  She has presented with acute renal failure of unknown etiology now with nonconvulsive status epilepticus.  EEG to evaluate for seizure. Level of alertness:  lethargic AEDs during EEG study: Keppra, Vimpat, Depakote, Ativan Technical aspects: This EEG study was done with scalp electrodes positioned according to the 10-20 International system of electrode placement. Electrical activity was acquired at a sampling rate of '500Hz'$  and reviewed with a high frequency filter of '70Hz'$  and a low frequency filter of '1Hz'$ . EEG data were recorded continuously and digitally stored. Description: No clear posterior dominant rhythm was seen.  EEG showed continuous generalized polymorphic sharply contoured predominantly 5 to 8 Hz theta and alpha activity admixed with intermittent generalized 2 to 3 Hz delta slowing.  Patient event button was pressed on 12/06/2021 at 1103 during which patient had subtle jerking, alteration go awareness per RN. Concomitant eeg before, during and after the event didn't show any eeg change to suggest seizure. Patient was noted to have subtle whole body jerking, more prominent when awake/stimulated. Concomitant eeg showed high amplitude sharply contoured 3-'6hz'$  theta-delta slowing without definite evolution. Hyperventilation and photic stimulation were not performed.    ABNORMALITY - Continuous slow, generalized IMPRESSION: This study is suggestive of severe diffuse encephalopathy, nonspecific to etiology. No seizures or epileptiform discharges were seen throughout the recording. Patient was noted to have subtle whole body jerks, more prominent when awake/stimulated without concomitant eeg change. These episodes were most likely not epileptic. Stimulation induced myoclonus could have similar appearance. [place]: BMET Recent Labs  Lab 11/30/21 1300 11/30/21 1817 12/01/21 0511 12/02/21 0130 12/03/21 0327 12/04/21 0438 12/05/21 0421 12/06/21 0432 12/06/21 1450 12/07/21 0201  NA 121* 123*   < > 131* 136 140 141 139 135 142  K 4.8 5.1   < > 4.0 3.4* 3.2* 3.4* 2.7* 2.8* 3.1*  CL 85* 88*   < > 97* 101 103 105 100 98 102  CO2 15* 15*   < > 16* 22 24 20* '26 27 27  '$ GLUCOSE 111* 99   < > 94 99 98 101* 102* 106* 127*  BUN 130* 125*   < > 76* 39* 48* 58* 29* 31* 30*  CREATININE 15.21* 15.49*   < > 9.01* 4.82* 5.29* 5.35* 3.02* 3.26* 3.16*  CALCIUM 8.0* 8.2*   < > 8.4* 8.2* 8.6* 8.6* 8.5* 8.2* 8.5*  PHOS 11.0* 10.8*  --   --   --   --   --   --   --  3.2   < > = values in this interval not displayed.   CBC Recent Labs  Lab 12/01/21 0511 12/04/21 0438 12/06/21 0432 12/07/21 0202  WBC 9.7 11.2* 12.5* 16.6*  NEUTROABS  --   --  10.0* 13.6*  HGB 9.3* 8.7* 7.1* 8.7*  HCT 26.6* 25.3* 20.8* 24.8*  MCV 81.3 82.7 83.5 82.1  PLT 140* 117* 90* 86*   Medications:     Chlorhexidine Gluconate Cloth  6 each Topical Q0600   metoprolol tartrate  2.5 mg Intravenous Q6H   sodium chloride flush  10-40 mL Intracatheter Q12H   Elmarie Shiley, MD 12/07/2021, 8:27 AM

## 2021-12-07 NOTE — Progress Notes (Signed)
[place] Progress Note Patient Name: Carla Gomez DOB: [birth year] MRN: [id number]   Date of Service  12/07/2021  HPI/Events of Note  Patient being treated for encephalopathy r/o non-convulsive seizures, transferred to the ICU for closer monitoring secondary to increasing agitation, requiring closer monitoring.  eICU Interventions  New Patient Evaluation.        [place] Yeagley [name] 12/07/2021, 1:23 AM

## 2021-12-07 NOTE — Progress Notes (Signed)
eLink Physician-Brief Progress Note Patient Name: Kaliopi Blyden DOB: June 05, 1956 MRN: [id number]   Date of Service  12/07/2021  HPI/Events of Note  Patient with agitated delirium. Patient needs an order for glucose containing iv fluids.  eICU Interventions  Zyprexa 5 mg IM x 1 ordered. D 5 % LR ordered to run at 100 ml / hour.        Kerry Kass Burwell [place] 12/07/2021, 6:25 AM

## 2021-12-07 NOTE — Progress Notes (Signed)
Maint. Complete. Skin check complete. No break down.

## 2021-12-07 NOTE — Progress Notes (Signed)
Carla Gomez EMU called and stated pt was lip smacking and having abnormal body movement and wanted me to be aware, and to notified the neurologist. I informed Dr. Aundra Dubin. Pt at this time is talking, but it is incomprehensible and attempting to kick her legs out of bed. Lip smacking and abnormal body movement not noted on assessment. Pt has remained at her admission baseline neuro function throughout shift.

## 2021-12-07 NOTE — Progress Notes (Signed)
[place] Progress Note Patient Name: Carla Gomez DOB: [birth year] MRN: [id number]   Date of Service  12/07/2021  HPI/Events of Note  K+ 3.1  eICU Interventions  KCL 10 meq iv Q 1 hour x 2 doses ordered.        Kerry Kass Breshae Belcher 12/07/2021, 5:05 AM

## 2021-12-08 DIAGNOSIS — G9341 Metabolic encephalopathy: Secondary | ICD-10-CM | POA: Diagnosis not present

## 2021-12-08 DIAGNOSIS — G40901 Epilepsy, unspecified, not intractable, with status epilepticus: Secondary | ICD-10-CM | POA: Diagnosis not present

## 2021-12-08 LAB — POCT I-STAT 7, (LYTES, BLD GAS, ICA,H+H)
Acid-Base Excess: 7 mmol/L — ABNORMAL HIGH (ref 0.0–2.0)
Bicarbonate: 30.1 mmol/L — ABNORMAL HIGH (ref 20.0–28.0)
Calcium, Ion: 1.16 mmol/L (ref 1.15–1.40)
HCT: 22 % — ABNORMAL LOW (ref 36.0–46.0)
Hemoglobin: 7.5 g/dL — ABNORMAL LOW (ref 12.0–15.0)
O2 Saturation: 95 %
Patient temperature: 98.2
Potassium: 3.1 mmol/L — ABNORMAL LOW (ref 3.5–5.1)
Sodium: 139 mmol/L (ref 135–145)
TCO2: 31 mmol/L (ref 22–32)
pCO2 arterial: 36.9 mmHg (ref 32–48)
pH, Arterial: 7.518 — ABNORMAL HIGH (ref 7.35–7.45)
pO2, Arterial: 66 mmHg — ABNORMAL LOW (ref 83–108)

## 2021-12-08 LAB — CSF CELL COUNT WITH DIFFERENTIAL
RBC Count, CSF: 600 /mm3 — ABNORMAL HIGH
RBC Count, CSF: 600 /mm3 — ABNORMAL HIGH
Tube #: 1
Tube #: 4
WBC, CSF: 3 /mm3 (ref 0–5)
WBC, CSF: 8 /mm3 — ABNORMAL HIGH (ref 0–5)

## 2021-12-08 LAB — MAGNESIUM
Magnesium: 1.8 mg/dL (ref 1.7–2.4)
Magnesium: 2.1 mg/dL (ref 1.7–2.4)
Magnesium: 2.1 mg/dL (ref 1.7–2.4)

## 2021-12-08 LAB — CBC
HCT: 24.5 % — ABNORMAL LOW (ref 36.0–46.0)
Hemoglobin: 8.6 g/dL — ABNORMAL LOW (ref 12.0–15.0)
MCH: 29.4 pg (ref 26.0–34.0)
MCHC: 35.1 g/dL (ref 30.0–36.0)
MCV: 83.6 fL (ref 80.0–100.0)
Platelets: 83 10*3/uL — ABNORMAL LOW (ref 150–400)
RBC: 2.93 MIL/uL — ABNORMAL LOW (ref 3.87–5.11)
RDW: 16 % — ABNORMAL HIGH (ref 11.5–15.5)
WBC: 21.9 10*3/uL — ABNORMAL HIGH (ref 4.0–10.5)
nRBC: 0 % (ref 0.0–0.2)

## 2021-12-08 LAB — PROTEIN AND GLUCOSE, CSF
Glucose, CSF: 74 mg/dL — ABNORMAL HIGH (ref 40–70)
Total  Protein, CSF: 46 mg/dL — ABNORMAL HIGH (ref 15–45)

## 2021-12-08 LAB — POTASSIUM: Potassium: 3.9 mmol/L (ref 3.5–5.1)

## 2021-12-08 LAB — GLUCOSE, CAPILLARY
Glucose-Capillary: 127 mg/dL — ABNORMAL HIGH (ref 70–99)
Glucose-Capillary: 95 mg/dL (ref 70–99)
Glucose-Capillary: 118 mg/dL — ABNORMAL HIGH (ref 70–99)
Glucose-Capillary: 85 mg/dL (ref 70–99)
Glucose-Capillary: 104 mg/dL — ABNORMAL HIGH (ref 70–99)

## 2021-12-08 LAB — RENAL FUNCTION PANEL
Albumin: 3.2 g/dL — ABNORMAL LOW (ref 3.5–5.0)
Anion gap: 10 (ref 5–15)
BUN: 27 mg/dL — ABNORMAL HIGH (ref 8–23)
CO2: 28 mmol/L (ref 22–32)
Calcium: 8.6 mg/dL — ABNORMAL LOW (ref 8.9–10.3)
Chloride: 104 mmol/L (ref 98–111)
Creatinine, Ser: 3.03 mg/dL — ABNORMAL HIGH (ref 0.44–1.00)
GFR, Estimated: 16 mL/min — ABNORMAL LOW (ref >60)
Glucose, Bld: 119 mg/dL — ABNORMAL HIGH (ref 70–99)
Phosphorus: 3.2 mg/dL (ref 2.5–4.6)
Potassium: 2.9 mmol/L — ABNORMAL LOW (ref 3.5–5.1)
Sodium: 142 mmol/L (ref 135–145)

## 2021-12-08 LAB — PHOSPHORUS
Phosphorus: 2.9 mg/dL (ref 2.5–4.6)
Phosphorus: 3.4 mg/dL (ref 2.5–4.6)

## 2021-12-08 MED ORDER — ORAL CARE MOUTH RINSE
15.0000 mL | Freq: Two times a day (BID) | OROMUCOSAL | Status: DC
  Administered 2021-12-08 – 2021-12-18 (×21): 15 mL via OROMUCOSAL

## 2021-12-08 MED ORDER — MIDAZOLAM HCL 2 MG/2ML IJ SOLN
INTRAMUSCULAR | Status: AC
  Filled 2021-12-08: qty 4

## 2021-12-08 MED ORDER — MIDAZOLAM HCL 2 MG/2ML IJ SOLN
INTRAMUSCULAR | Status: AC
  Administered 2021-12-08: 4 mg
  Filled 2021-12-08: qty 4

## 2021-12-08 MED ORDER — POTASSIUM CHLORIDE 10 MEQ/100ML IV SOLN
10.0000 meq | INTRAVENOUS | Status: AC
  Administered 2021-12-08 (×4): 10 meq via INTRAVENOUS
  Filled 2021-12-08 (×4): qty 100

## 2021-12-08 MED ORDER — MIDAZOLAM HCL 2 MG/2ML IJ SOLN
8.0000 mg | Freq: Once | INTRAMUSCULAR | Status: AC
  Administered 2021-12-08: 8 mg via INTRAVENOUS

## 2021-12-08 MED ORDER — FENTANYL CITRATE (PF) 100 MCG/2ML IJ SOLN: 50.0000 ug | Freq: Once | INTRAMUSCULAR | Status: AC

## 2021-12-08 MED ORDER — POTASSIUM CHLORIDE 20 MEQ PO PACK
40.0000 meq | PACK | Freq: Once | ORAL | Status: AC
  Administered 2021-12-08: 40 meq
  Filled 2021-12-08: qty 2

## 2021-12-08 MED ORDER — PROSOURCE TF PO LIQD
45.0000 mL | Freq: Two times a day (BID) | ORAL | Status: DC
  Administered 2021-12-08 – 2021-12-09 (×3): 45 mL
  Filled 2021-12-08 (×3): qty 45

## 2021-12-08 MED ORDER — SODIUM CHLORIDE 0.9 % IV SOLN
2.0000 g | INTRAVENOUS | Status: DC
  Administered 2021-12-08: 2 g via INTRAVENOUS
  Filled 2021-12-08: qty 12.5

## 2021-12-08 MED ORDER — MIDAZOLAM HCL 2 MG/2ML IJ SOLN: 2.0000 mg | Freq: Once | INTRAMUSCULAR | Status: AC

## 2021-12-08 MED ORDER — VITAL HIGH PROTEIN PO LIQD
1000.0000 mL | ORAL | Status: DC
  Administered 2021-12-08: 1000 mL

## 2021-12-08 MED ORDER — MAGNESIUM SULFATE 2 GM/50ML IV SOLN
2.0000 g | Freq: Once | INTRAVENOUS | Status: AC
  Administered 2021-12-08: 2 g via INTRAVENOUS
  Filled 2021-12-08: qty 50

## 2021-12-08 MED ORDER — DEXTROSE IN LACTATED RINGERS 5 % IV SOLN: INTRAVENOUS | Status: AC

## 2021-12-08 MED ORDER — VANCOMYCIN VARIABLE DOSE PER UNSTABLE RENAL FUNCTION (PHARMACIST DOSING)
Status: DC
Start: 2021-12-08 — End: 2021-12-09

## 2021-12-08 MED ORDER — FENTANYL CITRATE (PF) 100 MCG/2ML IJ SOLN
INTRAMUSCULAR | Status: AC
  Administered 2021-12-08: 50 ug via INTRAVENOUS
  Filled 2021-12-08: qty 2

## 2021-12-08 MED ORDER — DEXMEDETOMIDINE HCL IN NACL 400 MCG/100ML IV SOLN
0.4000 ug/kg/h | INTRAVENOUS | Status: DC
  Administered 2021-12-08: 0.4 ug/kg/h via INTRAVENOUS
  Administered 2021-12-08: 1 ug/kg/h via INTRAVENOUS
  Administered 2021-12-09: 0.4 ug/kg/h via INTRAVENOUS
  Administered 2021-12-09: 1 ug/kg/h via INTRAVENOUS
  Filled 2021-12-08 (×4): qty 100

## 2021-12-08 MED ORDER — MIDAZOLAM HCL 2 MG/2ML IJ SOLN
INTRAMUSCULAR | Status: AC
  Filled 2021-12-08: qty 2

## 2021-12-08 MED ORDER — VANCOMYCIN HCL 1500 MG/300ML IV SOLN
1500.0000 mg | Freq: Once | INTRAVENOUS | Status: AC
  Administered 2021-12-08: 1500 mg via INTRAVENOUS
  Filled 2021-12-08: qty 300

## 2021-12-08 NOTE — Procedures (Addendum)
Patient Name: Carla Gomez  MRN: [id number]  Epilepsy Attending: Lora Havens  Referring Physician/Provider: Lorenza Chick, MD Duration: 12/07/2021 2301 to 12/08/2021 1049   Patient history: 66 year old woman with past medical history significant for chronic pain on chronic opiates, chronic benzodiazepine use, fibromyalgia, hyperlipidemia.  She has presented with acute renal failure of unknown etiology now with nonconvulsive status epilepticus.  EEG to evaluate for seizure.   Level of alertness:  lethargic    AEDs during EEG study: Keppra, Vimpat, Depakote   Technical aspects: This EEG study was done with scalp electrodes positioned according to the 10-20 International system of electrode placement. Electrical activity was acquired at a sampling rate of '500Hz'$  and reviewed with a high frequency filter of '70Hz'$  and a low frequency filter of '1Hz'$ . EEG data were recorded continuously and digitally stored.    Description: No clear posterior dominant rhythm was seen.  EEG showed continuous generalized polymorphic sharply contoured predominantly 5 to 8 Hz theta and alpha activity admixed with intermittent generalized 2 to 3 Hz delta slowing.  Hyperventilation and photic stimulation were not performed.      ABNORMALITY - Continuous slow, generalized   IMPRESSION: This study is suggestive of severe diffuse encephalopathy, nonspecific to etiology. No seizures or epileptiform discharges were seen throughout the recording.   Bryten Maher Barbra Sarks

## 2021-12-08 NOTE — Progress Notes (Signed)
NAME:  Carla Gomez, MRN:  [id number], DOB:  1956/06/16, LOS: 3 ADMISSION DATE:  12/05/2021, CONSULTATION DATE:  5/26 REFERRING MD:  Hal Hope CHIEF COMPLAINT:  AMS   History of Present Illness:  Patient is encephalopathic and/or intubated. Therefore history has been obtained from chart review.    Carla Gomez, is a 66 y.o. female, who presented to the [place] ED on 5/19 with a chief complaint of altered mental status.  She was found to have an AKI and rhabdomyolysis.   They have a pertinent past medical history of fibromyalgia, chronic pain with migraines, hypertension   She was admitted to [facility] by the hospitalist.  She was transferred to [facility] on 5/25 for persistent altered mental status thought to be nonconvulsive status epilepticus.   PCCM was consulted for worsening mental status, agitation.  Past Medical History:  fibromyalgia, chronic pain with migraines, hypertension  Significant Hospital Events:  5/19-5/25 Admitted to [facility] for [place]  5/26 transferred to [facility]   Consults:  Neuro  Significant Diagnostic Tests:  5/26 long term EEG   Micro Data:  Urine culture 5/22 no growth   Antimicrobials:  Unasyn 5/25>   Interim History / Subjective:   Critically ill, remains confused, on LTVEEG   Objective   Blood pressure (!) 135/99, pulse 100, temperature 99.4 F (37.4 C), temperature source Axillary, resp. rate (!) 28, height '5\' 4"'$  (1.626 m), weight 72.2 kg, SpO2 94 %.        Intake/Output Summary (Last 24 hours) at 12/08/2021 0931 Last data filed at 12/08/2021 0900 Gross per 24 hour  Intake 3762.08 ml  Output 3550 ml  Net 212.08 ml   Filed Weights   12/06/21 2300 12/07/21 0456 12/08/21 0250  Weight: 74.7 kg 74.7 kg 72.2 kg    Examination: General: Patient remains critically ill, confused on LTV EEG HENT: NCAT, will not open eyes to track Lungs: Clear to auscultation bilaterally Cardiovascular: Regular rate rhythm, S1-S2 Abdomen: Soft,  nontender nondistended Extremities: No significant edema Neuro: Sedate, opens eyes to stimulus Skin: Warm dry   Resolved Hospital Problem list     Assessment & Plan:   Nonconvulsive status epilepticus Altered mental status, suspect secondary to SE vs hypoglycemia ?encephalitis  Plan: I spoke with neurology this morning. She is still on EEG. I do think that neurology needs to continue help [place] manage this patient. Currently on Vimpat Keppra and valproic acid. I do not have a etiology for her continued encephalopathy.  She does not appear to be continuing to seize. I have a question whether or not we should consider MRI of the brain and LP. After discussion with Dr. Theda Sers I think that is our next step. We will try to arrange for those today.  Aspiration pneumonia secondary nonconvulsive status epilepticus Sepsis Plan: Start empiric broad-spectrum antibiotics until LP is complete. We will await neurology's recommendation on whether or not should start acyclovir empirically?   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 Replete potassium   Normocytic anemia Follow H&H No sign of acute bleeding.  Best practice (evaluated daily)  Diet: NPO Pain/Anxiety/Delirium protocol (if indicated): n/a VAP protocol (if indicated): n/a DVT prophylaxis: SCDs GI prophylaxis: PPI Glucose control: none Mobility: bedrest Disposition:ICU Code Status: Full code   Labs   CBC: Recent Labs  Lab 12/04/21 0438 12/06/21 0432 12/07/21 0202 12/08/21 0013  12/08/21 0324  WBC 11.2* 12.5* 16.6*  --  21.9*  NEUTROABS  --  10.0* 13.6*  --   --   HGB 8.7* 7.1* 8.7* 7.5* 8.6*  HCT 25.3* 20.8* 24.8* 22.0* 24.5*  MCV 82.7 83.5 82.1  --  83.6  PLT 117* 90* 86*  --  83*    Basic Metabolic Panel: Recent Labs  Lab  12/06/21 0432 12/06/21 1450 12/07/21 0201 12/07/21 0519 12/07/21 1245 12/07/21 2132 12/08/21 0013 12/08/21 0324  NA 139 135 142  --  142  --  139 142  K 2.7* 2.8* 3.1*  --  2.8* 3.3* 3.1* 2.9*  CL 100 98 102  --  103  --   --  104  CO2 '26 27 27  '$ --  27  --   --  28  GLUCOSE 102* 106* 127*  --  131*  --   --  119*  BUN 29* 31* 30*  --  30*  --   --  27*  CREATININE 3.02* 3.26* 3.16*  --  3.20*  --   --  3.03*  CALCIUM 8.5* 8.2* 8.5*  --  8.6*  --   --  8.6*  MG 1.9 1.7  --  2.1  --   --   --  1.8  PHOS  --   --  3.2  --   --   --   --  3.2   GFR: Estimated Creatinine Clearance: 17.3 mL/min (A) (by C-G formula based on SCr of 3.03 mg/dL (H)). Recent Labs  Lab 12/04/21 0438 12/06/21 0432 12/07/21 0202 12/08/21 0324  WBC 11.2* 12.5* 16.6* 21.9*  LATICACIDVEN  --  0.8  --   --     Liver Function Tests: Recent Labs  Lab 12/06/21 0432 12/06/21 1450 12/07/21 0201 12/08/21 0324  AST 55* 47*  --   --   ALT 43 41  --   --   ALKPHOS 90 85  --   --   BILITOT 1.0 0.9  --   --   PROT 5.9* 6.1*  --   --   ALBUMIN 2.9* 3.0* 3.1* 3.2*   No results for input(s): LIPASE, AMYLASE in the last 168 hours. Recent Labs  Lab 12/06/21 0432  AMMONIA <10    ABG    Component Value Date/Time   PHART 7.518 (H) 12/08/2021 0013   PCO2ART 36.9 12/08/2021 0013   PO2ART 66 (L) 12/08/2021 0013   HCO3 30.1 (H) 12/08/2021 0013   TCO2 31 12/08/2021 0013   O2SAT 95 12/08/2021 0013     Coagulation Profile: Recent Labs  Lab 12/06/21 0554  INR 1.1    Cardiac Enzymes: Recent Labs  Lab 12/02/21 0130 12/03/21 0327 12/06/21 0432  CKTOTAL 246* 129 89    HbA1C: Hgb A1c MFr Bld  Date/Time Value Ref Range Status  11/30/2021 01:00 PM 5.7 (H) 4.8 - 5.6 % Final    Comment:    (NOTE) Pre diabetes:          5.7%-6.4%  Diabetes:              >6.4%  Glycemic control for   <7.0% adults with diabetes     CBG: Recent Labs  Lab 12/07/21 0753 12/07/21 1152 12/07/21 1532  12/08/21 0106 12/08/21 0739  GLUCAP 117* 130* 158* 127* 95    Review of Systems:   Unable to obtain  Past Medical History:  She,  has a past medical history of Cancer ([place]), Chronic fatigue,  Chronic pain, Dental bridge present, Fibromyalgia, Hypertension, and Migraine headache.   Surgical History:   Past Surgical History:  Procedure Laterality Date   ABDOMINAL HYSTERECTOMY     BREAST BIOPSY Left 09/26/14   lt bx/clip- benign   BREAST BIOPSY Left 11/02/14   benign   COLONOSCOPY WITH PROPOFOL N/A 12/21/2020   Procedure: COLONOSCOPY WITH PROPOFOL;  Surgeon: Lucilla Lame, MD;  Location: [facility];  Service: Endoscopy;  Laterality: N/A;   NASAL SINUS SURGERY       Social History:   reports that she has never smoked. She has never used smokeless tobacco. She reports that she does not drink alcohol and does not use drugs.   Family History:  Her family history includes Breast cancer in her maternal aunt.   Allergies Allergies  Allergen Reactions   Citrus Other (See Comments)    Triggers migraines   Latex Other (See Comments)    Redness around mouth after dental procedures   Other Other (See Comments)    ALLERGENS: Exposure to Inhalants such as grasses, pollen, trees, mold, mildew, cat dander, cows.  REACTION: post nasal drainage, runny nose and/or chronic sinus infections     Home Medications  Prior to Admission medications   Medication Sig Start Date End Date Taking? Authorizing Provider  amLODipine (NORVASC) 5 MG tablet Take 5 mg by mouth daily.   Yes [provider]  Cholecalciferol (VITAMIN D) 50 MCG (2000 UT) CAPS Take 2,000 Units by mouth daily.   Yes [provider]  cyclobenzaprine (FLEXERIL) 10 MG tablet Take 10 mg by mouth daily.   Yes [provider]  morphine (MS CONTIN) 100 MG 12 hr tablet Take 100 mg by mouth daily.   Yes [provider]  Multiple Vitamin (MULTIVITAMIN WITH MINERALS) TABS tablet Take 1 tablet by mouth  daily.   Yes [provider]  naloxone (NARCAN) nasal spray 4 mg/0.1 mL Place 1 spray into the nose See admin instructions. Place one spray in the nare as needed for opioid reversal.  May repeat once in the opposite nare if needed. 09/28/21  Yes [provider]  rosuvastatin (CRESTOR) 20 MG tablet Take 20 mg by mouth daily at 6 PM.   Yes [provider]  SUMAtriptan (IMITREX) 100 MG tablet Take 100 mg by mouth every 2 (two) hours as needed for migraine. May repeat in 2 hours if headache persists or recurs.   Yes [provider]  acetaminophen (TYLENOL) 325 MG tablet Take 2 tablets (650 mg total) by mouth every 6 (six) hours as needed for mild pain (or Fever >/= 101). 12/05/21   Loletha Grayer, MD  Ampicillin-Sulbactam 3 g in sodium chloride 0.9 % 100 mL Inject 3 g into the vein daily. 12/06/21   Loletha Grayer, MD  heparin 5000 UNIT/ML injection Inject 1 mL (5,000 Units total) into the skin every 8 (eight) hours. 12/05/21   Loletha Grayer, MD  levETIRAcetam (KEPRRA) 500 MG/100ML SOLN Inject 100 mLs (500 mg total) into the vein daily. 12/06/21   Loletha Grayer, MD  metoprolol tartrate (LOPRESSOR) 5 MG/5ML SOLN injection Inject 2.5 mLs (2.5 mg total) into the vein every 6 (six) hours. 12/06/21   Loletha Grayer, MD  ondansetron (ZOFRAN) 4 MG/2ML SOLN injection Inject 2 mLs (4 mg total) into the vein every 6 (six) hours as needed for nausea. 12/05/21   Wieting, Richard, MD  sodium chloride 0.9 % infusion Inject 50 mLs into the vein continuous. 12/05/21   Loletha Grayer, MD  This patient is critically ill with multiple organ system failure; which, requires frequent high complexity decision making, assessment, support, evaluation, and titration of therapies. This was completed through the application of advanced monitoring technologies and extensive interpretation of multiple databases. During this encounter critical care time was devoted to patient care services  described in this note for 36 minutes.   Garner Nash, DO [facility] 12/08/2021 9:31 AM

## 2021-12-08 NOTE — Progress Notes (Addendum)
Patient ID: Carla Gomez, female   DOB: 03-May-1956, 66 y.o.   MRN: [id number] [facility] Progress Note   Assessment/ Plan:   1.  Acute kidney injury: Unclear etiology but suspected to be possibly sustained prerenal evolving into ATN associated with nonconvulsive status epilepticus prior to hospitalization as well limiting oral intake.  With good urine output overnight and some improvement of BUN/creatinine noted indicative of renal recovery.  No indications for dialysis at this time (left IJ dialysis catheter pigtail being used for intravenous access). 2.  Hypokalemia: Secondary to impaired intake in the setting of encephalopathy and previous losses from dialysis and diuresis/polyuria.  Will supplement potassium via enteral route. 3.  Nonconvulsive status epilepticus: Seen earlier by neurology currently on continuous EEG monitoring with ongoing treatment on Keppra, Depacon, Vimpat and as needed lorazepam. 4.  Aspiration pneumonia: Ongoing treatment with Unasyn.  Worsening leukocytosis noted with questions arising regarding additional infection (encephalitis) versus demargination from nonconvulsive status epilepticus. 5.  Anemia: Possibly from blood losses around her left IJ dialysis catheter and secondary to acute/critical illness although cannot entirely rule out the possibility of underlying chronic kidney disease of unknown severity/chronicity.  Her iron stores are adequate, yesterday given ESA dose.   Subjective:   Without acute events overnight   Objective:   BP (!) 146/109 (BP Location: Left Arm)   Pulse 97   Temp 99.4 F (37.4 C) (Axillary)   Resp (!) 22   Ht '5\' 4"'$  (1.626 m)   Wt 72.2 kg   SpO2 98%   BMI 27.32 kg/m   Intake/Output Summary (Last 24 hours) at 12/08/2021 0825 Last data filed at 12/08/2021 0701 Gross per 24 hour  Intake 3621.67 ml  Output 3550 ml  Net 71.67 ml   Weight change: -2.5 kg  Physical Exam: Gen: Remains on continuous EEG monitoring,  briefly responsive to calling out her first name but continues to repeatedly mumble unidentifiable words/phrases CVS: Pulse regular rhythm, normal rate, S1 and S2 normal Resp: Coarse breath sounds bilaterally, no distinct rales/rhonchi Abd: Soft, obese, nontender, bowel sounds normal Ext: Trace ankle edema  Imaging: DG Abd Portable 1V  Result Date: 12/07/2021 CLINICAL DATA:  Enteric catheter placement EXAM: PORTABLE ABDOMEN - 1 VIEW COMPARISON:  11/30/2021 FINDINGS: Frontal view of the lower chest and upper abdomen demonstrates enteric catheter passing below diaphragm, tip projecting over the region the proximal duodenum. Bowel gas pattern is unremarkable. No acute bony abnormalities. IMPRESSION: 1. Enteric catheter tip projecting over the proximal duodenum. Electronically Signed   By: Randa Ngo M.D.   On: 12/07/2021 16:02   Overnight EEG with video  Result Date: 12/06/2021 Carla Havens, MD     12/07/2021  8:27 AM Patient Name: Carla Gomez MRN: [id number] Epilepsy Attending: Lora Gomez Referring Physician/Provider: Lorenza Chick, MD Duration: 12/05/2021 2301 to 12/06/2021 2301 Patient history: 66 year old woman with past medical history significant for chronic pain on chronic opiates, chronic benzodiazepine use, fibromyalgia, hyperlipidemia.  She has presented with acute renal failure of unknown etiology now with nonconvulsive status epilepticus.  EEG to evaluate for seizure. Level of alertness:  lethargic AEDs during EEG study: Keppra, Vimpat, Depakote, Ativan Technical aspects: This EEG study was done with scalp electrodes positioned according to the 10-20 International system of electrode placement. Electrical activity was acquired at a sampling rate of '500Hz'$  and reviewed with a high frequency filter of '70Hz'$  and a low frequency filter of '1Hz'$ . EEG data were recorded continuously and digitally stored. Description: No  clear posterior dominant rhythm was seen.  EEG showed  continuous generalized polymorphic sharply contoured predominantly 5 to 8 Hz theta and alpha activity admixed with intermittent generalized 2 to 3 Hz delta slowing.  Patient event button was pressed on 12/06/2021 at 1103 during which patient had subtle jerking, alteration go awareness per RN. Concomitant eeg before, during and after the event didn't show any eeg change to suggest seizure. Patient was noted to have subtle whole body jerking, more prominent when awake/stimulated. Concomitant eeg showed high amplitude sharply contoured 3-'6hz'$  theta-delta slowing without definite evolution. Hyperventilation and photic stimulation were not performed.   ABNORMALITY - Continuous slow, generalized IMPRESSION: This study is suggestive of severe diffuse encephalopathy, nonspecific to etiology. No seizures or epileptiform discharges were seen throughout the recording. Patient was noted to have subtle whole body jerks, more prominent when awake/stimulated without concomitant eeg change. These episodes were most likely not epileptic. Stimulation induced myoclonus could have similar appearance. [place]: BMET Recent Labs  Lab 12/04/21 [phone] 12/05/21 0421 12/06/21 0432 12/06/21 1450 12/07/21 0201 12/07/21 1245 12/07/21 2132 12/08/21 0013 12/08/21 0324  NA 140 141 139 135 142 142  --  139 142  K 3.2* 3.4* 2.7* 2.8* 3.1* 2.8* 3.3* 3.1* 2.9*  CL 103 105 100 98 102 103  --   --  104  CO2 24 20* '26 27 27 27  '$ --   --  28  GLUCOSE 98 101* 102* 106* 127* 131*  --   --  119*  BUN 48* 58* 29* 31* 30* 30*  --   --  27*  CREATININE 5.29* 5.35* 3.02* 3.26* 3.16* 3.20*  --   --  3.03*  CALCIUM 8.6* 8.6* 8.5* 8.2* 8.5* 8.6*  --   --  8.6*  PHOS  --   --   --   --  3.2  --   --   --  3.2   CBC Recent Labs  Lab 12/04/21 0438 12/06/21 0432 12/07/21 0202 12/08/21 0013 12/08/21 0324  WBC 11.2* 12.5* 16.6*  --  21.9*  NEUTROABS  --  10.0* 13.6*  --   --   HGB 8.7* 7.1* 8.7* 7.5* 8.6*  HCT 25.3* 20.8*  24.8* 22.0* 24.5*  MCV 82.7 83.5 82.1  --  83.6  PLT 117* 90* 86*  --  83*   Medications:     Chlorhexidine Gluconate Cloth  6 each Topical Q0600   darbepoetin (ARANESP) injection - NON-DIALYSIS  100 mcg Subcutaneous Q Sat-1800   mouth rinse  15 mL Mouth Rinse BID   metoprolol tartrate  5 mg Intravenous Q6H   oxyCODONE  5 mg Per Tube Q6H   sodium chloride flush  10-40 mL Intracatheter Q12H   Elmarie Shiley, MD 12/08/2021, 8:25 AM

## 2021-12-08 NOTE — Progress Notes (Signed)
EEG removed, no skin breakdown noted

## 2021-12-08 NOTE — Procedures (Signed)
Lumbar Puncture Procedure Note  Carla Gomez  [id number]  [birth year]  Date:12/08/21  Time:11:49 AM   Provider Performing:Ozzie Knobel   Procedure: Lumbar Puncture (79480)  Indication(s) Rule out meningitis  Consent Unable to obtain consent due to emergent nature of procedure.  Anesthesia Topical only with 1% lidocaine    Time Out Verified patient identification, verified procedure, site/side was marked, verified correct patient position, special equipment/implants available, medications/allergies/relevant history reviewed, required imaging and test results available.   Sterile Technique Maximal sterile technique including sterile barrier drape, hand hygiene, sterile gown, sterile gloves, mask, hair covering.    Procedure Description Using palpation, approximate location of L3-L4 space identified.   Lidocaine used to anesthetize skin and subcutaneous tissue overlying this area.  A 20g spinal needle was then used to access the subarachnoid space. Opening pressure:Not obtained. Closing pressure:Not obtained. Clear CSF obtained.  Complications/Tolerance None; patient tolerated the procedure well.   EBL Minimal   Specimen(s) CSF

## 2021-12-08 NOTE — Progress Notes (Signed)
Pharmacy Antibiotic Note  Carla Gomez is a 66 y.o. female admitted on 12/05/2021 with sepsis.  Pharmacy has been consulted for Vanc and Cefepime dosing. CrCl 17.3 today   Plan: Start Cefepime 2 gm IV q24hr Start vanc 1500 mg IV x 1, then variable dosing given changing renal function Monitor renal function, vanc levels and C&S  Height: '5\' 4"'$  (162.6 cm) Weight: 72.2 kg (159 lb 2.8 oz) IBW/kg (Calculated) : 54.7  Temp (24hrs), Avg:99.1 F (37.3 C), Min:98.2 F (36.8 C), Max:99.8 F (37.7 C)  Recent Labs  Lab 12/04/21 0438 12/05/21 0421 12/06/21 0432 12/06/21 1450 12/07/21 0201 12/07/21 0202 12/07/21 1245 12/08/21 0324  WBC 11.2*  --  12.5*  --   --  16.6*  --  21.9*  CREATININE 5.29*   < > 3.02* 3.26* 3.16*  --  3.20* 3.03*  LATICACIDVEN  --   --  0.8  --   --   --   --   --    < > = values in this interval not displayed.    Estimated Creatinine Clearance: 17.3 mL/min (A) (by C-G formula based on SCr of 3.03 mg/dL (H)).    Allergies  Allergen Reactions   Citrus Other (See Comments)    Triggers migraines   Latex Other (See Comments)    Redness around mouth after dental procedures   Other Other (See Comments)    ALLERGENS: Exposure to Inhalants such as grasses, pollen, trees, mold, mildew, cat dander, cows.  REACTION: post nasal drainage, runny nose and/or chronic sinus infections    Antimicrobials this admission: Unasyn 5/25  >> 5/28 Vanc 5/28 >>  Cefep 5/28 >>  Thank you for allowing pharmacy to be a part of this patient's care.  Alanda Slim, PharmD, [facility] Clinical Pharmacist Please see AMION for all Pharmacists' Contact Phone Numbers 12/08/2021, 10:03 AM

## 2021-12-08 NOTE — Progress Notes (Addendum)
eLink Physician-Brief Progress Note Patient Name: Carla Gomez DOB: 1955/07/21 MRN: [id number]   Date of Service  12/08/2021  HPI/Events of Note  Received request for IVF orders.   Pt is NPO and was on D5LR with glucose at 85-104.  eICU Interventions  Resume D5LR'@100cc'$ /hr.      Intervention Category Minor Interventions: Other:  [name] 12/08/2021, 9:49 PM  12:42 AM Pt had jerk-like movements that lasted for more than 9 minutes. Pt given 2 doses of ativan which seemed to resolved the jerking.   Neurology was contacted and did not feel these are real seizures.   ABG 7.460/35.9/62.   Plan> Pt is protecting her airway.  Get MRI.  Ok to resume precedex.   1:39 AM Pt is getting MRI done now. She is calm and sedate but can intermittently move around in the bed.   Plan> Versed '2mg'$  IV PRN for sedation.

## 2021-12-09 ENCOUNTER — Inpatient Hospital Stay (HOSPITAL_COMMUNITY): Payer: Medicare Other

## 2021-12-09 DIAGNOSIS — N179 Acute kidney failure, unspecified: Secondary | ICD-10-CM | POA: Diagnosis not present

## 2021-12-09 DIAGNOSIS — G40901 Epilepsy, unspecified, not intractable, with status epilepticus: Secondary | ICD-10-CM | POA: Diagnosis not present

## 2021-12-09 DIAGNOSIS — G9341 Metabolic encephalopathy: Secondary | ICD-10-CM | POA: Diagnosis not present

## 2021-12-09 LAB — GLUCOSE, CAPILLARY
Glucose-Capillary: 105 mg/dL — ABNORMAL HIGH (ref 70–99)
Glucose-Capillary: 114 mg/dL — ABNORMAL HIGH (ref 70–99)
Glucose-Capillary: 123 mg/dL — ABNORMAL HIGH (ref 70–99)
Glucose-Capillary: 137 mg/dL — ABNORMAL HIGH (ref 70–99)
Glucose-Capillary: 154 mg/dL — ABNORMAL HIGH (ref 70–99)
Glucose-Capillary: 79 mg/dL (ref 70–99)
Glucose-Capillary: 82 mg/dL (ref 70–99)

## 2021-12-09 LAB — HEPATIC FUNCTION PANEL
ALT: 32 U/L (ref 0–44)
AST: 41 U/L (ref 15–41)
Albumin: 2.8 g/dL — ABNORMAL LOW (ref 3.5–5.0)
Alkaline Phosphatase: 138 U/L — ABNORMAL HIGH (ref 38–126)
Bilirubin, Direct: 0.2 mg/dL (ref 0.0–0.2)
Indirect Bilirubin: 0.3 mg/dL (ref 0.3–0.9)
Total Bilirubin: 0.5 mg/dL (ref 0.3–1.2)
Total Protein: 6 g/dL — ABNORMAL LOW (ref 6.5–8.1)

## 2021-12-09 LAB — VALPROIC ACID LEVEL: Valproic Acid Lvl: 71 ug/mL (ref 50.0–100.0)

## 2021-12-09 LAB — POCT I-STAT 7, (LYTES, BLD GAS, ICA,H+H)
Acid-Base Excess: 2 mmol/L (ref 0.0–2.0)
Bicarbonate: 25.6 mmol/L (ref 20.0–28.0)
Calcium, Ion: 1.21 mmol/L (ref 1.15–1.40)
HCT: 22 % — ABNORMAL LOW (ref 36.0–46.0)
Hemoglobin: 7.5 g/dL — ABNORMAL LOW (ref 12.0–15.0)
O2 Saturation: 93 %
Patient temperature: 98.2
Potassium: 3.1 mmol/L — ABNORMAL LOW (ref 3.5–5.1)
Sodium: 141 mmol/L (ref 135–145)
TCO2: 27 mmol/L (ref 22–32)
pCO2 arterial: 35.9 mmHg (ref 32–48)
pH, Arterial: 7.46 — ABNORMAL HIGH (ref 7.35–7.45)
pO2, Arterial: 62 mmHg — ABNORMAL LOW (ref 83–108)

## 2021-12-09 LAB — CBC WITH DIFFERENTIAL/PLATELET
Abs Immature Granulocytes: 0.1 10*3/uL — ABNORMAL HIGH (ref 0.00–0.07)
Basophils Absolute: 0 10*3/uL (ref 0.0–0.1)
Basophils Relative: 0 %
Eosinophils Absolute: 0.6 10*3/uL — ABNORMAL HIGH (ref 0.0–0.5)
Eosinophils Relative: 5 %
HCT: 23.3 % — ABNORMAL LOW (ref 36.0–46.0)
Hemoglobin: 7.6 g/dL — ABNORMAL LOW (ref 12.0–15.0)
Immature Granulocytes: 1 %
Lymphocytes Relative: 8 %
Lymphs Abs: 0.9 10*3/uL (ref 0.7–4.0)
MCH: 28.6 pg (ref 26.0–34.0)
MCHC: 32.6 g/dL (ref 30.0–36.0)
MCV: 87.6 fL (ref 80.0–100.0)
Monocytes Absolute: 0.6 10*3/uL (ref 0.1–1.0)
Monocytes Relative: 6 %
Neutro Abs: 8.6 10*3/uL — ABNORMAL HIGH (ref 1.7–7.7)
Neutrophils Relative %: 80 %
Platelets: 79 10*3/uL — ABNORMAL LOW (ref 150–400)
RBC: 2.66 MIL/uL — ABNORMAL LOW (ref 3.87–5.11)
RDW: 16.7 % — ABNORMAL HIGH (ref 11.5–15.5)
WBC: 10.8 10*3/uL — ABNORMAL HIGH (ref 4.0–10.5)
nRBC: 0 % (ref 0.0–0.2)

## 2021-12-09 LAB — RENAL FUNCTION PANEL
Albumin: 2.7 g/dL — ABNORMAL LOW (ref 3.5–5.0)
Anion gap: 9 (ref 5–15)
BUN: 35 mg/dL — ABNORMAL HIGH (ref 8–23)
CO2: 26 mmol/L (ref 22–32)
Calcium: 8.5 mg/dL — ABNORMAL LOW (ref 8.9–10.3)
Chloride: 106 mmol/L (ref 98–111)
Creatinine, Ser: 2.89 mg/dL — ABNORMAL HIGH (ref 0.44–1.00)
GFR, Estimated: 17 mL/min — ABNORMAL LOW (ref >60)
Glucose, Bld: 152 mg/dL — ABNORMAL HIGH (ref 70–99)
Phosphorus: 4.5 mg/dL (ref 2.5–4.6)
Potassium: 3.1 mmol/L — ABNORMAL LOW (ref 3.5–5.1)
Sodium: 141 mmol/L (ref 135–145)

## 2021-12-09 LAB — MAGNESIUM
Magnesium: 2.1 mg/dL (ref 1.7–2.4)
Magnesium: 1.8 mg/dL (ref 1.7–2.4)

## 2021-12-09 LAB — VANCOMYCIN, RANDOM: Vancomycin Rm: 18

## 2021-12-09 LAB — HSV 1/2 PCR, CSF
HSV-1 DNA: NEGATIVE
HSV-2 DNA: NEGATIVE

## 2021-12-09 LAB — PHOSPHORUS: Phosphorus: 4.2 mg/dL (ref 2.5–4.6)

## 2021-12-09 LAB — AMMONIA: Ammonia: 33 umol/L (ref 9–35)

## 2021-12-09 MED ORDER — SENNOSIDES-DOCUSATE SODIUM 8.6-50 MG PO TABS
1.0000 | ORAL_TABLET | Freq: Two times a day (BID) | ORAL | Status: DC
Start: 2021-12-09 — End: 2021-12-12
  Administered 2021-12-09 – 2021-12-11 (×3): 1
  Filled 2021-12-09 (×4): qty 1

## 2021-12-09 MED ORDER — IOHEXOL 9 MG/ML PO SOLN
ORAL | Status: AC
  Administered 2021-12-09: 500 mL
  Filled 2021-12-09: qty 1000

## 2021-12-09 MED ORDER — SODIUM CHLORIDE 0.9 % IV SOLN: INTRAVENOUS | Status: DC | PRN

## 2021-12-09 MED ORDER — LORAZEPAM 2 MG/ML IJ SOLN
2.0000 mg | Freq: Once | INTRAMUSCULAR | Status: DC
Start: 2021-12-09 — End: 2021-12-09

## 2021-12-09 MED ORDER — MIDAZOLAM HCL 2 MG/2ML IJ SOLN: 2.0000 mg | INTRAMUSCULAR | Status: DC | PRN

## 2021-12-09 MED ORDER — SODIUM CHLORIDE 0.9 % IV SOLN
1.0000 g | INTRAVENOUS | Status: DC
  Administered 2021-12-09 – 2021-12-11 (×3): 1 g via INTRAVENOUS
  Filled 2021-12-09 (×3): qty 10

## 2021-12-09 MED ORDER — POLYETHYLENE GLYCOL 3350 17 G PO PACK
17.0000 g | PACK | Freq: Every day | ORAL | Status: DC
Start: 2021-12-09 — End: 2021-12-12
  Administered 2021-12-09: 17 g
  Filled 2021-12-09 (×2): qty 1

## 2021-12-09 MED ORDER — VITAL 1.5 CAL PO LIQD
1000.0000 mL | ORAL | Status: DC
  Administered 2021-12-09 – 2021-12-12 (×2): 1000 mL

## 2021-12-09 MED ORDER — ALPRAZOLAM 0.25 MG PO TABS
0.2500 mg | ORAL_TABLET | Freq: Four times a day (QID) | ORAL | Status: DC
Start: 2021-12-09 — End: 2021-12-10
  Administered 2021-12-09 – 2021-12-10 (×4): 0.25 mg
  Filled 2021-12-09 (×4): qty 1

## 2021-12-09 MED ORDER — PROSOURCE TF PO LIQD
45.0000 mL | Freq: Every day | ORAL | Status: DC
  Administered 2021-12-10 – 2021-12-12 (×3): 45 mL
  Filled 2021-12-09 (×3): qty 45

## 2021-12-09 MED ORDER — POTASSIUM CHLORIDE 20 MEQ PO PACK
40.0000 meq | PACK | Freq: Once | ORAL | Status: AC
  Administered 2021-12-09: 40 meq
  Filled 2021-12-09: qty 2

## 2021-12-09 MED ORDER — CEFTRIAXONE SODIUM 1 G IJ SOLR
1.0000 g | INTRAMUSCULAR | Status: DC
Start: 2021-12-09 — End: 2021-12-09

## 2021-12-09 MED ORDER — MIDAZOLAM HCL 2 MG/2ML IJ SOLN
INTRAMUSCULAR | Status: AC
  Filled 2021-12-09: qty 4

## 2021-12-09 MED ORDER — SODIUM CHLORIDE 0.9 % IV SOLN
200.0000 mg | Freq: Two times a day (BID) | INTRAVENOUS | Status: DC
  Administered 2021-12-09 – 2021-12-11 (×4): 200 mg via INTRAVENOUS
  Filled 2021-12-09 (×5): qty 20

## 2021-12-09 MED ORDER — VANCOMYCIN HCL 750 MG/150ML IV SOLN
750.0000 mg | INTRAVENOUS | Status: DC
  Administered 2021-12-09 – 2021-12-10 (×2): 750 mg via INTRAVENOUS
  Filled 2021-12-09 (×3): qty 150

## 2021-12-09 NOTE — Progress Notes (Signed)
LTM EEG hooked up and running - no initial skin breakdown - push button tested - neuro notified. [facility] monitoring.  

## 2021-12-09 NOTE — Progress Notes (Addendum)
NAME:  Carla Gomez, MRN:  [id number], DOB:  December 18, 1955, LOS: 4 ADMISSION DATE:  12/05/2021, CONSULTATION DATE:  5/26 REFERRING MD:  Hal Hope CHIEF COMPLAINT:  AMS   History of Present Illness:  Patient is encephalopathic and/or intubated. Therefore history has been obtained from chart review.    Carla Gomez, is a 66 y.o. female, who presented to the [place] ED on 5/19 with a chief complaint of altered mental status.  She was found to have an AKI and rhabdomyolysis.   They have a pertinent past medical history of fibromyalgia, chronic pain with migraines, hypertension   She was admitted to [facility] by the hospitalist.  She was transferred to [facility] on 5/25 for persistent altered mental status thought to be nonconvulsive status epilepticus.   PCCM was consulted for worsening mental status, agitation.  Past Medical History:  fibromyalgia, chronic pain with migraines, hypertension  Significant Hospital Events:  5/19-5/25 Admitted to [facility] for [place]  5/26 transferred to [facility]   Consults:  Neuro  Significant Diagnostic Tests:  5/26 long term EEG   Micro Data:  Urine culture 5/22 no growth   Antimicrobials:  Unasyn 5/25>   Interim History / Subjective:   Remains critically ill minimally responsive.  Comatose state.  Objective   Blood pressure 127/67, pulse 64, temperature (!) 97.1 F (36.2 C), temperature source Axillary, resp. rate (!) 27, height '5\' 4"'$  (1.626 m), weight 72.1 kg, SpO2 98 %.        Intake/Output Summary (Last 24 hours) at 12/09/2021 0831 Last data filed at 12/09/2021 0600 Gross per 24 hour  Intake 2636.02 ml  Output 1600 ml  Net 1036.02 ml   Filed Weights   12/07/21 0456 12/08/21 0250 12/09/21 0414  Weight: 74.7 kg 72.2 kg 72.1 kg    Examination: General: Female resting in bed comatose state, off EEG at this time HENT: NCAT, does not open eyes, to voice or stimulation Lungs: Clear to auscultation bilaterally no crackles no  wheeze Cardiovascular: Regular rate rhythm, S1-S2 Abdomen: S soft, nontender nondistended Extremities: No significant edema Neuro: Sedated, we will grimace and withdrawal to pain.  Does not follow commands Skin: No rash   Resolved Hospital Problem list     Assessment & Plan:   Nonconvulsive status epilepticus, now persistent comatose state Altered mental status, suspect secondary to SE vs hypoglycemia ?encephalitis, LP was bland, no white cells I do not think she has encephalitis I wonder whether or not this is a drug related toxicity.  She had 1 dose of cefepime.  I do not think this is related. Plan: I spoke with neurology, Dr. Rory Percy. We appreciate neurology input Continue Vimpat Keppra valproic acid MRI brain results reviewed, LP results reviewed I still do not have a clear etiology what is keeping her in this comatose state Check ammonia, check valproic acid level, ? Secondary hyperammonemia  Aspiration pneumonia secondary nonconvulsive status epilepticus Sepsis Plan: De-escalate antibiotics, ceftriaxone plus vancomycin I have no clear source of infection.  We will image since chest abdomen pelvis.  CT without contrast.  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   Normocytic anemia Follow H&H No acute signs of bleeding.  Best practice (evaluated daily)  Diet: NPO Pain/Anxiety/Delirium protocol (if indicated): n/a VAP protocol (if indicated): n/a DVT prophylaxis: SCDs GI prophylaxis: PPI Glucose  control: none Mobility: bedrest Disposition:ICU Code Status: Full code   Labs   CBC: Recent Labs  Lab 12/04/21 0438 12/06/21 0432 12/07/21 0202 12/08/21 0013 12/08/21 0324 12/09/21 0026  WBC 11.2* 12.5* 16.6*  --  21.9*  --   NEUTROABS   --  10.0* 13.6*  --   --   --   HGB 8.7* 7.1* 8.7* 7.5* 8.6* 7.5*  HCT 25.3* 20.8* 24.8* 22.0* 24.5* 22.0*  MCV 82.7 83.5 82.1  --  83.6  --   PLT 117* 90* 86*  --  83*  --     Basic Metabolic Panel: Recent Labs  Lab 12/06/21 1450 12/07/21 0201 12/07/21 0519 12/07/21 1245 12/07/21 2132 12/08/21 0013 12/08/21 0324 12/08/21 1229 12/08/21 1742 12/09/21 0026 12/09/21 0655  NA 135 142  --  142  --  139 142  --   --  141 141  K 2.8* 3.1*  --  2.8*   < > 3.1* 2.9* 3.9  --  3.1* 3.1*  CL 98 102  --  103  --   --  104  --   --   --  106  CO2 27 27  --  27  --   --  28  --   --   --  26  GLUCOSE 106* 127*  --  131*  --   --  119*  --   --   --  152*  BUN 31* 30*  --  30*  --   --  27*  --   --   --  35*  CREATININE 3.26* 3.16*  --  3.20*  --   --  3.03*  --   --   --  2.89*  CALCIUM 8.2* 8.5*  --  8.6*  --   --  8.6*  --   --   --  8.5*  MG 1.7  --  2.1  --   --   --  1.8 2.1 2.1  --  2.1  PHOS  --  3.2  --   --   --   --  3.2 2.9 3.4  --  4.5   < > = values in this interval not displayed.   GFR: Estimated Creatinine Clearance: 18.1 mL/min (A) (by C-G formula based on SCr of 2.89 mg/dL (H)). Recent Labs  Lab 12/04/21 0438 12/06/21 0432 12/07/21 0202 12/08/21 0324  WBC 11.2* 12.5* 16.6* 21.9*  LATICACIDVEN  --  0.8  --   --     Liver Function Tests: Recent Labs  Lab 12/06/21 0432 12/06/21 1450 12/07/21 0201 12/08/21 0324 12/09/21 0655  AST 55* 47*  --   --   --   ALT 43 41  --   --   --   ALKPHOS 90 85  --   --   --   BILITOT 1.0 0.9  --   --   --   PROT 5.9* 6.1*  --   --   --   ALBUMIN 2.9* 3.0* 3.1* 3.2* 2.7*   No results for input(s): LIPASE, AMYLASE in the last 168 hours. Recent Labs  Lab 12/06/21 0432  AMMONIA <10    ABG    Component Value Date/Time   PHART 7.460 (H) 12/09/2021 0026   PCO2ART 35.9 12/09/2021 0026   PO2ART 62 (L) 12/09/2021 0026   HCO3 25.6 12/09/2021 0026   TCO2 27 12/09/2021 0026   O2SAT 93 12/09/2021 0026     Coagulation  Profile:  Recent Labs  Lab 12/06/21 0554  INR 1.1    Cardiac Enzymes: Recent Labs  Lab 12/03/21 0327 12/06/21 0432  CKTOTAL 129 89    HbA1C: Hgb A1c MFr Bld  Date/Time Value Ref Range Status  11/30/2021 01:00 PM 5.7 (H) 4.8 - 5.6 % Final    Comment:    (NOTE) Pre diabetes:          5.7%-6.4%  Diabetes:              >6.4%  Glycemic control for   <7.0% adults with diabetes     CBG: Recent Labs  Lab 12/08/21 1556 12/08/21 1935 12/09/21 0008 12/09/21 0420 12/09/21 0749  GLUCAP 85 104* 123* 79 154*    Review of Systems:   Unable to obtain  Past Medical History:  She,  has a past medical history of Cancer ([place]), Chronic fatigue, Chronic pain, Dental bridge present, Fibromyalgia, Hypertension, and Migraine headache.   Surgical History:   Past Surgical History:  Procedure Laterality Date   ABDOMINAL HYSTERECTOMY     BREAST BIOPSY Left 09/26/14   lt bx/clip- benign   BREAST BIOPSY Left 11/02/14   benign   COLONOSCOPY WITH PROPOFOL N/A 12/21/2020   Procedure: COLONOSCOPY WITH PROPOFOL;  Surgeon: Lucilla Lame, MD;  Location: [facility];  Service: Endoscopy;  Laterality: N/A;   NASAL SINUS SURGERY       Social History:   reports that she has never smoked. She has never used smokeless tobacco. She reports that she does not drink alcohol and does not use drugs.   Family History:  Her family history includes Breast cancer in her maternal aunt.   Allergies Allergies  Allergen Reactions   Citrus Other (See Comments)    Triggers migraines   Latex Other (See Comments)    Redness around mouth after dental procedures   Other Other (See Comments)    ALLERGENS: Exposure to Inhalants such as grasses, pollen, trees, mold, mildew, cat dander, cows.  REACTION: post nasal drainage, runny nose and/or chronic sinus infections     Home Medications  Prior to Admission medications   Medication Sig Start Date End Date Taking? Authorizing Provider  amLODipine  (NORVASC) 5 MG tablet Take 5 mg by mouth daily.   Yes [provider]  Cholecalciferol (VITAMIN D) 50 MCG (2000 UT) CAPS Take 2,000 Units by mouth daily.   Yes [provider]  cyclobenzaprine (FLEXERIL) 10 MG tablet Take 10 mg by mouth daily.   Yes [provider]  morphine (MS CONTIN) 100 MG 12 hr tablet Take 100 mg by mouth daily.   Yes [provider]  Multiple Vitamin (MULTIVITAMIN WITH MINERALS) TABS tablet Take 1 tablet by mouth daily.   Yes [provider]  naloxone (NARCAN) nasal spray 4 mg/0.1 mL Place 1 spray into the nose See admin instructions. Place one spray in the nare as needed for opioid reversal.  May repeat once in the opposite nare if needed. 09/28/21  Yes [provider]  rosuvastatin (CRESTOR) 20 MG tablet Take 20 mg by mouth daily at 6 PM.   Yes [provider]  SUMAtriptan (IMITREX) 100 MG tablet Take 100 mg by mouth every 2 (two) hours as needed for migraine. May repeat in 2 hours if headache persists or recurs.   Yes [provider]  acetaminophen (TYLENOL) 325 MG tablet Take 2 tablets (650 mg total) by mouth every 6 (six) hours as needed for mild pain (or Fever >/= 101). 12/05/21  Wieting, Richard, MD  Ampicillin-Sulbactam 3 g in sodium chloride 0.9 % 100 mL Inject 3 g into the vein daily. 12/06/21   Loletha Grayer, MD  heparin 5000 UNIT/ML injection Inject 1 mL (5,000 Units total) into the skin every 8 (eight) hours. 12/05/21   Loletha Grayer, MD  levETIRAcetam (KEPRRA) 500 MG/100ML SOLN Inject 100 mLs (500 mg total) into the vein daily. 12/06/21   Loletha Grayer, MD  metoprolol tartrate (LOPRESSOR) 5 MG/5ML SOLN injection Inject 2.5 mLs (2.5 mg total) into the vein every 6 (six) hours. 12/06/21   Loletha Grayer, MD  ondansetron (ZOFRAN) 4 MG/2ML SOLN injection Inject 2 mLs (4 mg total) into the vein every 6 (six) hours as needed for nausea. 12/05/21   Wieting, Richard, MD  sodium chloride 0.9 %  infusion Inject 50 mLs into the vein continuous. 12/05/21   Loletha Grayer, MD   This patient is critically ill with multiple organ system failure; which, requires frequent high complexity decision making, assessment, support, evaluation, and titration of therapies. This was completed through the application of advanced monitoring technologies and extensive interpretation of multiple databases. During this encounter critical care time was devoted to patient care services described in this note for 36 minutes.  Garner Nash, DO [facility] 12/09/2021 8:32 AM

## 2021-12-09 NOTE — Progress Notes (Signed)
Pharmacy Antibiotic Note  Carla Gomez is a 66 y.o. female admitted on 12/05/2021 with sepsis.  Pharmacy has been consulted for Vanc and Cefepime dosing.   Patient with tmax of 99.4, currently afebrile. WBC slightly elevated at 10.8. Scr trending down to 2.89 this morning. Uop has picked up with 1.6L over the past 24 hours.   Random vancomycin level was 18 drawn this afternoon. Vancomycin was loaded on 5/28'@1200'$  so level was drawn roughly 26 hours since load.   Plan: Continue ceftriaxone 1g q24 hours Continue Vancomycin 750 mg IV q24 hours, consider rechecking trough in a couple of days  Monitor renal function, vanc levels and C&S  Height: '5\' 4"'$  (162.6 cm) Weight: 72.1 kg (158 lb 15.2 oz) IBW/kg (Calculated) : 54.7  Temp (24hrs), Avg:97.3 F (36.3 C), Min:95.4 F (35.2 C), Max:99.4 F (37.4 C)  Recent Labs  Lab 12/04/21 0438 12/05/21 0421 12/06/21 0432 12/06/21 1450 12/07/21 0201 12/07/21 0202 12/07/21 1245 12/08/21 0324 12/09/21 0655 12/09/21 0924 12/09/21 1434  WBC 11.2*  --  12.5*  --   --  16.6*  --  21.9*  --  10.8*  --   CREATININE 5.29*   < > 3.02* 3.26* 3.16*  --  3.20* 3.03* 2.89*  --   --   LATICACIDVEN  --   --  0.8  --   --   --   --   --   --   --   --   VANCORANDOM  --   --   --   --   --   --   --   --   --   --  18   < > = values in this interval not displayed.     Estimated Creatinine Clearance: 18.1 mL/min (A) (by C-G formula based on SCr of 2.89 mg/dL (H)).    Allergies  Allergen Reactions   Citrus Other (See Comments)    Triggers migraines   Latex Other (See Comments)    Redness around mouth after dental procedures   Other Other (See Comments)    ALLERGENS: Exposure to Inhalants such as grasses, pollen, trees, mold, mildew, cat dander, cows.  REACTION: post nasal drainage, runny nose and/or chronic sinus infections    Antimicrobials this admission: Unasyn 5/25  >> 5/28 Vanc 5/28 >>  Cefep 5/28 >>5/29 Ceftriaxone 5/29>>  Thank you  for allowing pharmacy to be a part of this patient's care.  Erin Hearing PharmD., BCPS Clinical Pharmacist 12/09/2021 4:05 PM

## 2021-12-09 NOTE — Progress Notes (Signed)
Patient ID: Carla Gomez, female   DOB: 08/04/1955, 66 y.o.   MRN: [id number] [facility] Progress Note   Assessment/ Plan:   1.  Acute kidney injury: Unclear etiology but suspected to be possibly sustained prerenal evolving into ATN associated with nonconvulsive status epilepticus prior to hospitalization as well limited oral intake.   -good urine output, Cr improving. No indications for renal replacement. She does have a LIJ temp line which can be removed if not using for IV access -Nothing else to add at this time from a nephrology perspective, will sign off, please call with any questions concerns. 2.  Hypokalemia: Secondary to impaired intake in the setting of encephalopathy and previous losses from dialysis and diuresis/polyuria.  Replete prn 3.  Nonconvulsive status epilepticus: neuro following 4.  Aspiration pneumonia: Ongoing treatment with Unasyn.  Worsening leukocytosis noted with questions arising regarding additional infection (encephalitis) versus demargination from nonconvulsive status epilepticus. 5.  Anemia: Possibly from blood losses around her left IJ dialysis catheter and secondary to acute/critical illness although cannot entirely rule out the possibility of underlying chronic kidney disease of unknown severity/chronicity.  Her iron stores are adequate, rsa started 5/27. Transfuse prn   Subjective:   Seizure activity overnight, no acute findings on MRI brain. Uop ~1.6L (4 unmeasured voids) Off EEG at this time   Objective:   BP 127/67   Pulse 64   Temp (!) 97.1 F (36.2 C) (Axillary)   Resp (!) 27   Ht '5\' 4"'$  (1.626 m)   Wt 72.1 kg   SpO2 98%   BMI 27.28 kg/m   Intake/Output Summary (Last 24 hours) at 12/09/2021 0935 Last data filed at 12/09/2021 0600 Gross per 24 hour  Intake 2408.75 ml  Output 1600 ml  Net 808.75 ml   Weight change: -0.1 kg  Physical Exam: Gen: ill appearing,  CVS: Pulse regular rhythm, normal rate, S1 and S2  normal Resp: CTA BL, no distinct rales/rhonchi Abd: Soft, obese, nontender Ext: Trace ankle edema Neuro: sedated/lethargic, not following commands  Imaging: MR BRAIN WO CONTRAST  Result Date: 12/09/2021 CLINICAL DATA:  No status change of unknown cause EXAM: MRI HEAD WITHOUT CONTRAST TECHNIQUE: Multiplanar, multiecho pulse sequences of the brain and surrounding structures were obtained without intravenous contrast. COMPARISON:  Head CT 10 days ago FINDINGS: Brain: No acute infarction, hemorrhage, hydrocephalus, extra-axial collection or mass lesion. Confluent FLAIR hyperintensity in the deep cerebral white matter, presumed chronic microvascular ischemia. Mild chronic white matter disease seen in the brainstem. Age normal brain volume. No focal cortical finding to explain seizure. Symmetric preserved hippocampal architecture. Vascular: Major flow voids are preserved Skull and upper cervical spine: Normal marrow signal. Sinuses/Orbits: Postoperative right maxillary sinus. Minimal mastoid opacification. Other: Occipital scalp lipoma, incidental. IMPRESSION: 1. No acute finding.  No focal cortical abnormality. 2. Chronic small vessel ischemia which is extensive in the cerebral white matter. Electronically Signed   By: Jorje Guild M.D.   On: 12/09/2021 05:33   DG Abd Portable 1V  Result Date: 12/07/2021 CLINICAL DATA:  Enteric catheter placement EXAM: PORTABLE ABDOMEN - 1 VIEW COMPARISON:  11/30/2021 FINDINGS: Frontal view of the lower chest and upper abdomen demonstrates enteric catheter passing below diaphragm, tip projecting over the region the proximal duodenum. Bowel gas pattern is unremarkable. No acute bony abnormalities. IMPRESSION: 1. Enteric catheter tip projecting over the proximal duodenum. Electronically Signed   By: Randa Ngo M.D.   On: 12/07/2021 16:02    Labs: [employer]  12/05/21 0421 12/06/21 0432 12/06/21 1450 12/07/21 0201 12/07/21 1245 12/07/21 2132  12/08/21 0013 12/08/21 0324 12/08/21 1229 12/08/21 1742 12/09/21 0026 12/09/21 0655  NA 141 139 135 142 142  --  139 142  --   --  141 141  K 3.4* 2.7* 2.8* 3.1* 2.8* 3.3* 3.1* 2.9* 3.9  --  3.1* 3.1*  CL 105 100 98 102 103  --   --  104  --   --   --  106  CO2 20* '26 27 27 27  '$ --   --  28  --   --   --  26  GLUCOSE 101* 102* 106* 127* 131*  --   --  119*  --   --   --  152*  BUN 58* 29* 31* 30* 30*  --   --  27*  --   --   --  35*  CREATININE 5.35* 3.02* 3.26* 3.16* 3.20*  --   --  3.03*  --   --   --  2.89*  CALCIUM 8.6* 8.5* 8.2* 8.5* 8.6*  --   --  8.6*  --   --   --  8.5*  PHOS  --   --   --  3.2  --   --   --  3.2 2.9 3.4  --  4.5   CBC Recent Labs  Lab 12/04/21 0438 12/06/21 0432 12/07/21 0202 12/08/21 0013 12/08/21 0324 12/09/21 0026  WBC 11.2* 12.5* 16.6*  --  21.9*  --   NEUTROABS  --  10.0* 13.6*  --   --   --   HGB 8.7* 7.1* 8.7* 7.5* 8.6* 7.5*  HCT 25.3* 20.8* 24.8* 22.0* 24.5* 22.0*  MCV 82.7 83.5 82.1  --  83.6  --   PLT 117* 90* 86*  --  83*  --    Medications:     Chlorhexidine Gluconate Cloth  6 each Topical Q0600   darbepoetin (ARANESP) injection - NON-DIALYSIS  100 mcg Subcutaneous Q Sat-1800   feeding supplement (PROSource TF)  45 mL Per Tube BID   feeding supplement (VITAL HIGH PROTEIN)  1,000 mL Per Tube Q24H   iohexol       LORazepam  2 mg Intravenous Once   mouth rinse  15 mL Mouth Rinse BID   metoprolol tartrate  5 mg Intravenous Q6H   oxyCODONE  5 mg Per Tube Q6H   polyethylene glycol  17 g Per Tube Daily   senna-docusate  1 tablet Per Tube BID   sodium chloride flush  10-40 mL Intracatheter Q12H   vancomycin variable dose per unstable renal function (pharmacist dosing)   Does not apply See admin instructions   Gean Quint, MD [facility] 12/09/2021, 9:35 AM

## 2021-12-09 NOTE — Progress Notes (Signed)
Patient ID: Carla Gomez, female   DOB: [birth year], 66 y.o.   MRN: [id number]  Neurology Progress Note  Patient ID: Carla Gomez is a 66 y.o. with PMHx of  has a past medical history of Cancer (), Chronic fatigue, Chronic pain, Dental bridge present, Fibromyalgia, Hypertension, and Migraine headache.  Initially consulted for: NCSE   Subjective: Nonresponsive  Exam: Current vital signs: BP 118/67   Pulse 66   Temp 98.2 F (36.8 C) (Axillary)   Resp 16   Ht '5\' 4"'$  (1.626 m)   Wt 72.2 kg   SpO2 100%   BMI 27.32 kg/m  Vital signs in last 24 hours: Temp:  [98.2 F (36.8 C)-99.8 F (37.7 C)] 98.2 F (36.8 C) (05/28 2000) Pulse Rate:  [62-106] 66 (05/28 2230) Resp:  [16-38] 16 (05/28 2230) BP: (91-169)/(56-149) 118/67 (05/28 2230) SpO2:  [88 %-100 %] 100 % (05/28 2230) Weight:  [72.2 kg] 72.2 kg (05/28 0250)   Gen: In bed, comfortable  Resp: non-labored breathing, no grossly audible wheezing Cardiac: Perfusing extremities well  Abd: soft, nt  Neuro: MS: No verbal output, does not follow any commands, does not open eyes to noxious stimulation CN: Pupils equal round reactive to light, spontaneous eye movements bilaterally, equal response to eyelash brush bilaterally, gag intact Motor: Localizes with the bilateral upper extremities intermittently, intermittently only withdraws.  Moving both lower extremities equally and spontaneously at least 4/5.  Diffuse twitching movements involving different parts of her body and her face Sensory: Equally reactive in all 4 extremities  Pertinent Labs:  Basic Metabolic Panel: Recent Labs  Lab 12/06/21 0432 12/06/21 1450 12/07/21 0201 12/07/21 0519 12/07/21 1245 12/07/21 2132 12/08/21 0013 12/08/21 0324 12/08/21 1229 12/08/21 1742 12/09/21 0026  NA 139 135 142  --  142  --  139 142  --   --  141  K 2.7* 2.8* 3.1*  --  2.8* 3.3* 3.1* 2.9* 3.9  --  3.1*  CL 100 98 102  --  103  --   --  104  --   --   --   CO2 '26 27  27  '$ --  27  --   --  28  --   --   --   GLUCOSE 102* 106* 127*  --  131*  --   --  119*  --   --   --   BUN 29* 31* 30*  --  30*  --   --  27*  --   --   --   CREATININE 3.02* 3.26* 3.16*  --  3.20*  --   --  3.03*  --   --   --   CALCIUM 8.5* 8.2* 8.5*  --  8.6*  --   --  8.6*  --   --   --   MG 1.9 1.7  --  2.1  --   --   --  1.8 2.1 2.1  --   PHOS  --   --  3.2  --   --   --   --  3.2 2.9 3.4  --     CBC: Recent Labs  Lab 12/04/21 0438 12/06/21 0432 12/07/21 0202 12/08/21 0013 12/08/21 0324 12/09/21 0026  WBC 11.2* 12.5* 16.6*  --  21.9*  --   NEUTROABS  --  10.0* 13.6*  --   --   --   HGB 8.7* 7.1* 8.7* 7.5* 8.6* 7.5*  HCT 25.3* 20.8* 24.8* 22.0* 24.5*  22.0*  MCV 82.7 83.5 82.1  --  83.6  --   PLT 117* 90* 86*  --  83*  --     Coagulation Studies: Recent Labs    12/06/21 0554  LABPROT 13.8  INR 1.1      Latest Reference Range & Units 12/08/21 11:52  Appearance, CSF CLEAR  CLEAR  CLEAR ! CLEAR !  Glucose, CSF 40 - 70 mg/dL 74 (H)  RBC Count, CSF 0 /cu mm 0 /cu mm 600 (H) 600 (H)  WBC, CSF 0 - 5 /cu mm 0 - 5 /cu mm 8 (H) 3  Other Cells, CSF  TOO FEW TO COUNT, SMEAR AVAILABLE FOR REVIEW TOO FEW TO COUNT, SMEAR AVAILABLE FOR REVIEW  Color, CSF COLORLESS  COLORLESS  COLORLESS COLORLESS  Supernatant  NOT INDICATED NOT INDICATED  Total  Protein, CSF 15 - 45 mg/dL 46 (H)  Tube #  4 1  !: Data is abnormal (H): Data is abnormally high  Impression: Examination is quite similar to initial my evaluation.  CSF is reassuring against an infectious or inflammatory process.  Given prolonged EEG recording capturing this same clinical activity, I do not think that this activity represents seizure, and should be characterized as a seizure-like activity.  The patient's metabolic parameters have improved significantly, but it is possible that she suffered brain injury when in severe renal failure.  MRI brain will help in clarifying structural injury.  Please note that  cefepime is particularly deliriogenic, and renally cleared, so the worsening of her mental status today may be in the setting of cefepime toxicity  Recommendations: -MRI brain without contrast -Please narrow cefepime to a less neurotoxic antibiotic as soon as able -Please avoid additional doses of Ativan for this particular seizure-like activity, given it has been proven to be nonepileptic on EEG -Continue current AEDs, may consider weaning these to reduce encephalopathy under long-term EEG monitoring but I defer to Dr. Hortense Ramal -Appreciate CCM management of other comorbidities and work-up of the worsening leukocytosis and fever -Neurology to follow, given the prior concern for nonconvulsive status epilepticus will ask Dr. Hortense Ramal to see tomorrow  Lesleigh Noe MD-PhD [facility] [phone]  Discussed with Dr. James Ivanoff by phone  CRITICAL CARE Performed by: Lorenza Chick   Total critical care time: 40 minutes  Critical care time was exclusive of separately billable procedures and treating other patients.  Critical care was necessary to treat or prevent imminent or life-threatening deterioration, evaluation for concern for seizure-like activity  Critical care was time spent personally by me on the following activities: development of treatment plan with patient and/or surrogate as well as nursing, discussions with consultants, evaluation of patient's response to treatment, examination of patient, obtaining history from patient or surrogate, ordering and performing treatments and interventions, ordering and review of laboratory studies, ordering and review of radiographic studies, pulse oximetry and re-evaluation of patient's condition.

## 2021-12-09 NOTE — Significant Event (Signed)
Nurse called to inform patient had worms in stool. Went to bedside to assess. Ova and parasite and GI pathogen ordered. ID consulted.

## 2021-12-09 NOTE — Progress Notes (Signed)
Initial Nutrition Assessment  DOCUMENTATION CODES:   Not applicable  INTERVENTION:   - Plan to exchange NG tube for Cortrak and bridle on next Cortrak service date tomorrow (12/10/21)  Continue tube feeding via NG tube: - Change to Vital 1.5 @ 50 ml/hr (1200 ml/day) - ProSource TF 45 ml daily  Tube feeding regimen provides 1840 kcal, 92 grams of protein, and 917 ml of H2O.   NUTRITION DIAGNOSIS:   Inadequate oral intake related to lethargy/confusion as evidenced by NPO status.  GOAL:   Patient will meet greater than or equal to 90% of their needs  MONITOR:   Diet advancement, Labs, Weight trends, TF tolerance, I & O's  REASON FOR ASSESSMENT:   Consult Enteral/tube feeding initiation and management  ASSESSMENT:   66 year old female who presented to [facility] from [facility] on 5/25 due to status epilepticus. Pt initially admitted to [facility] on 5/19 due to AMS, AKI, and aspiration PNA. Nephrology was consulted and pt was started on HD. PMH of fibromyalgia, HTN, chronic pain, basal cell carcinoma.  05/27 - NG tube placed (tip projecting over proximal duodenum per x-ray) 05/28 - s/p lumbar puncture, NG tube placed  Pt with NG tube in place and tube feeds infusing as ordered. RD to adjust to better meet pt's needs. Discussed with RN.  Discussed pt with CCM MD. Recommend bowel regimen as pt has not had a BM since 12/01/21 and abdomen taut on exam. Miralax and senna added. CCM in agreement with switching out NG tube for Cortrak and bridle tomorrow. Order in place.  Neurology following. MRI brain without contrast completed this morning with no acute findings. Per Nephrology note, pt with good UOP and creatinine improving. No indications for RRT. Nephrology has signed off.  Pt sleeping at time of visit and did not awaken to RD touch or voice. Unable to obtain diet or weight history at this time. Reviewed weight history in chart. Pt with a 4.6 kg weight loss since 12/05/21. If accurate, this is a  6% weight loss in less than 1 week which is severe and significant for timeframe. RD will continue to monitor trends.  Admit weight: 74.7 kg Current weight: 72.1 kg  Pt with non-pitting edema to BUE and BLE.  Current TF: Vital High Protein @ 40 ml/hr, ProSource TF 45 ml BID  Medications reviewed and include: aranesp weekly, klor-con 40 mEq once, IV abx, precedex drip, D5 in LR @ 100 ml/hr  Labs reviewed: potassium 3.1, BUN 35, creatinine 2.89, WBC 21.9, hemoglobin 7.5, hemoglobin 83 CBG's: 79-154 x 24 hours  UOP: 1600 ml x 24 hours I/O's: -1.1 L since admit  NUTRITION - FOCUSED PHYSICAL EXAM:  Flowsheet Row Most Recent Value  Orbital Region No depletion  Upper Arm Region No depletion  Thoracic and Lumbar Region No depletion  Buccal Region No depletion  Temple Region No depletion  Clavicle Bone Region Mild depletion  Clavicle and Acromion Bone Region Mild depletion  Scapular Bone Region No depletion  Dorsal Hand No depletion  Patellar Region Mild depletion  Anterior Thigh Region Mild depletion  Posterior Calf Region Mild depletion  Edema (RD Assessment) Mild  Hair Reviewed  Eyes Reviewed  Mouth Reviewed  Skin Reviewed  Nails Reviewed       Diet Order:   Diet Order             Diet NPO time specified  Diet effective now  EDUCATION NEEDS:   No education needs have been identified at this time  Skin:  Skin Assessment: Reviewed RN Assessment  Last BM:  12/01/21  Height:   Ht Readings from Last 1 Encounters:  12/06/21 '5\' 4"'$  (1.626 m)    Weight:   Wt Readings from Last 1 Encounters:  12/09/21 72.1 kg    BMI:  Body mass index is 27.28 kg/m.  Estimated Nutritional Needs:   Kcal:  1800-2000  Protein:  90-110 grams  Fluid:  1.8-2.0 L    Gustavus Bryant, MS, RD, LDN Inpatient Clinical Dietitian Please see AMiON for contact information.

## 2021-12-09 NOTE — Progress Notes (Addendum)
Subjective: Patient has been having episodes of whole body jerking/shivering movements.  I personally reviewed prior notes summarized as follows.  Per ED triage note, patient was noted to be hollering by neighbors, reportedly had a fall and was not sleeping for days. Patient also reported not being able to take oxycodone which she was on for chronic pain.  On initial evaluation by medicine team at [facility], she was slow to answer questions with occasional tangential thoughts and inappropriate answers.  Blood pressure on arrival was 121/59, diagnosed with UTI and started on ceftriaxone but urine culture was not obtained, sodium of 123, BUN of 123 with creatinine of 16.28, CK13 75.  Lumbar puncture was performed which did show 8 WBCs, elevated glucose at 74, protein at 46, HSV pending.  Renal function has improved.  On vancomycin and ceftriaxone  ROS: Unable to obtain due to poor mental status  Examination  Vital signs in last 24 hours: Temp:  [97 F (36.1 C)-99.4 F (37.4 C)] 97.1 F (36.2 C) (05/29 0745) Pulse Rate:  [58-106] 64 (05/29 0630) Resp:  [16-32] 27 (05/29 0630) BP: (60-160)/(33-135) 127/67 (05/29 0630) SpO2:  [88 %-100 %] 98 % (05/29 0630) Weight:  [72.1 kg] 72.1 kg (05/29 0414)  General: lying in bed, NAD Neuro: Does not open eyes to noxious stimuli, PERRLA, no gaze deviation, no apparent facial asymmetry, withdraws to noxious stimuli in all 4 extremities with antigravity strength, increased tone in all 4 extremities which at times appears volitional, myoclonus as well as subtle tremor in all 4 extremities  Basic Metabolic Panel: Recent Labs  Lab 12/06/21 1450 12/07/21 0201 12/07/21 0519 12/07/21 1245 12/07/21 2132 12/08/21 0013 12/08/21 0324 12/08/21 1229 12/08/21 1742 12/09/21 0026 12/09/21 0655  NA 135 142  --  142  --  139 142  --   --  141 141  K 2.8* 3.1*  --  2.8*   < > 3.1* 2.9* 3.9  --  3.1* 3.1*  CL 98 102  --  103  --   --  104  --   --   --  106  CO2 27 27   --  27  --   --  28  --   --   --  26  GLUCOSE 106* 127*  --  131*  --   --  119*  --   --   --  152*  BUN 31* 30*  --  30*  --   --  27*  --   --   --  35*  CREATININE 3.26* 3.16*  --  3.20*  --   --  3.03*  --   --   --  2.89*  CALCIUM 8.2* 8.5*  --  8.6*  --   --  8.6*  --   --   --  8.5*  MG 1.7  --  2.1  --   --   --  1.8 2.1 2.1  --  2.1  PHOS  --  3.2  --   --   --   --  3.2 2.9 3.4  --  4.5   < > = values in this interval not displayed.    CBC: Recent Labs  Lab 12/04/21 0438 12/06/21 0432 12/07/21 0202 12/08/21 0013 12/08/21 0324 12/09/21 0026 12/09/21 0924  WBC 11.2* 12.5* 16.6*  --  21.9*  --  10.8*  NEUTROABS  --  10.0* 13.6*  --   --   --  8.6*  HGB  8.7* 7.1* 8.7* 7.5* 8.6* 7.5* 7.6*  HCT 25.3* 20.8* 24.8* 22.0* 24.5* 22.0* 23.3*  MCV 82.7 83.5 82.1  --  83.6  --  87.6  PLT 117* 90* 86*  --  83*  --  79*     Coagulation Studies: No results for input(s): LABPROT, INR in the last 72 hours.  Imaging  MRI brain without contrast 12/09/2021: No acute abnormality.  ASSESSMENT AND PLAN: 66 year old female who was brought in on 5/90/2023 via EMS due to altered mental status.    Acute encephalopathy, suspected toxic-metabolic -Patient's encephalopathy is likely multifactorial due to uremia, hyponatremia, possible infection which have all gradually improved as well as medications, prolonged postictal state, benzodiazepine withdrawal  Recommendations -We will stop Depakote as that can sometimes contribute to hypothermia, generalized tremors -Also, patient was on Xanax 1 mg 4 times daily.  Some of her symptoms could be secondary to benzodiazepine withdrawal.  Would resume at lower dose of 0.25 mg 4 times daily and can gradually increase if tolerated/needed - Continue keppra '500mg'$  Q24 hour and '250mg'$  after HD - Continue Vimpat '100mg'$  BID and '50mg'$  after HD -We will obtain video EEG while we make these changes in medications to look for ictal-interictal activity -Avoid using  IV Ativan for the tremor-like activity to avoid excessive sedation.  Please contact neurology if tremor-like activity persists for prolonged (over 15 minutes) -Continue seizure precautions -Plan discussed with Dr. Talmadge Coventry - Has rhythmic faster activity in vertex region. Will increase vimpat to '200mg'$  BID  CRITICAL CARE Performed by: Lora Havens   Total critical care time: 45 minutes  Critical care time was exclusive of separately billable procedures and treating other patients.  Critical care was necessary to treat or prevent imminent or life-threatening deterioration.  Critical care was time spent personally by me on the following activities: development of treatment plan with patient and/or surrogate as well as nursing, discussions with consultants, evaluation of patient's response to treatment, examination of patient, obtaining history from patient or surrogate, ordering and performing treatments and interventions, ordering and review of laboratory studies, ordering and review of radiographic studies, pulse oximetry and re-evaluation of patient's condition.    Zeb Comfort Epilepsy [facility] For questions after 5pm please refer to AMION to reach the Neurologist on call

## 2021-12-10 ENCOUNTER — Inpatient Hospital Stay (HOSPITAL_COMMUNITY): Payer: Medicare Other

## 2021-12-10 DIAGNOSIS — N184 Chronic kidney disease, stage 4 (severe): Secondary | ICD-10-CM

## 2021-12-10 DIAGNOSIS — G9340 Encephalopathy, unspecified: Secondary | ICD-10-CM

## 2021-12-10 LAB — CBC WITH DIFFERENTIAL/PLATELET
Abs Immature Granulocytes: 0.19 10*3/uL — ABNORMAL HIGH (ref 0.00–0.07)
Basophils Absolute: 0.1 10*3/uL (ref 0.0–0.1)
Basophils Relative: 0 %
Eosinophils Absolute: 0.7 10*3/uL — ABNORMAL HIGH (ref 0.0–0.5)
Eosinophils Relative: 4 %
HCT: 24.6 % — ABNORMAL LOW (ref 36.0–46.0)
Hemoglobin: 8.2 g/dL — ABNORMAL LOW (ref 12.0–15.0)
Immature Granulocytes: 1 %
Lymphocytes Relative: 4 %
Lymphs Abs: 0.7 10*3/uL (ref 0.7–4.0)
MCH: 29.1 pg (ref 26.0–34.0)
MCHC: 33.3 g/dL (ref 30.0–36.0)
MCV: 87.2 fL (ref 80.0–100.0)
Monocytes Absolute: 0.8 10*3/uL (ref 0.1–1.0)
Monocytes Relative: 4 %
Neutro Abs: 16.2 10*3/uL — ABNORMAL HIGH (ref 1.7–7.7)
Neutrophils Relative %: 87 %
Platelets: 134 10*3/uL — ABNORMAL LOW (ref 150–400)
RBC: 2.82 MIL/uL — ABNORMAL LOW (ref 3.87–5.11)
RDW: 17 % — ABNORMAL HIGH (ref 11.5–15.5)
WBC: 18.7 10*3/uL — ABNORMAL HIGH (ref 4.0–10.5)
nRBC: 0 % (ref 0.0–0.2)

## 2021-12-10 LAB — GASTROINTESTINAL PANEL BY PCR, STOOL (REPLACES STOOL CULTURE)

## 2021-12-10 LAB — GLUCOSE, CAPILLARY
Glucose-Capillary: 155 mg/dL — ABNORMAL HIGH (ref 70–99)
Glucose-Capillary: 115 mg/dL — ABNORMAL HIGH (ref 70–99)
Glucose-Capillary: 93 mg/dL (ref 70–99)
Glucose-Capillary: 115 mg/dL — ABNORMAL HIGH (ref 70–99)
Glucose-Capillary: 108 mg/dL — ABNORMAL HIGH (ref 70–99)
Glucose-Capillary: 98 mg/dL (ref 70–99)

## 2021-12-10 LAB — RENAL FUNCTION PANEL
Albumin: 2.9 g/dL — ABNORMAL LOW (ref 3.5–5.0)
Anion gap: 9 (ref 5–15)
BUN: 31 mg/dL — ABNORMAL HIGH (ref 8–23)
CO2: 26 mmol/L (ref 22–32)
Calcium: 8.8 mg/dL — ABNORMAL LOW (ref 8.9–10.3)
Chloride: 105 mmol/L (ref 98–111)
Creatinine, Ser: 2.48 mg/dL — ABNORMAL HIGH (ref 0.44–1.00)
GFR, Estimated: 21 mL/min — ABNORMAL LOW (ref >60)
Glucose, Bld: 145 mg/dL — ABNORMAL HIGH (ref 70–99)
Phosphorus: 3.1 mg/dL (ref 2.5–4.6)
Potassium: 3.8 mmol/L (ref 3.5–5.1)
Sodium: 140 mmol/L (ref 135–145)

## 2021-12-10 LAB — VOLATILES,BLD-ACETONE,ETHANOL,ISOPROP,METHANOL
Acetone, blood: 0.013 g/dL — ABNORMAL HIGH (ref 0.000–0.010)
Ethanol, blood: <0.01 g/dL (ref 0.000–0.010)
Isopropanol, blood: <0.01 g/dL (ref 0.000–0.010)
Methanol, blood: <0.01 g/dL (ref 0.000–0.010)

## 2021-12-10 MED ORDER — LEVETIRACETAM IN NACL 500 MG/100ML IV SOLN
500.0000 mg | Freq: Two times a day (BID) | INTRAVENOUS | Status: DC
  Administered 2021-12-10 – 2021-12-11 (×2): 500 mg via INTRAVENOUS
  Filled 2021-12-10 (×2): qty 100

## 2021-12-10 MED ORDER — METOPROLOL TARTRATE 25 MG/10 ML ORAL SUSPENSION
25.0000 mg | Freq: Two times a day (BID) | ORAL | Status: DC
Start: 2021-12-10 — End: 2021-12-12
  Administered 2021-12-10 – 2021-12-12 (×5): 25 mg
  Filled 2021-12-10 (×5): qty 10

## 2021-12-10 MED ORDER — ALPRAZOLAM 0.25 MG PO TABS
0.5000 mg | ORAL_TABLET | Freq: Four times a day (QID) | ORAL | Status: DC
  Administered 2021-12-10 – 2021-12-11 (×3): 0.5 mg
  Filled 2021-12-10 (×4): qty 2

## 2021-12-10 MED ORDER — HEPARIN SODIUM (PORCINE) 5000 UNIT/ML IJ SOLN
5000.0000 [IU] | Freq: Three times a day (TID) | INTRAMUSCULAR | Status: DC
Start: 2021-12-10 — End: 2021-12-13
  Administered 2021-12-10 – 2021-12-13 (×9): 5000 [IU] via SUBCUTANEOUS
  Filled 2021-12-10 (×9): qty 1

## 2021-12-10 NOTE — Progress Notes (Signed)
Subjective: No definite seizure-like episodes overnight.  Patient was noted to have warm and stools.  ROS: Unable to obtain due to poor mental status  Examination  Vital signs in last 24 hours: Temp:  [96 F (35.6 C)-99.5 F (37.5 C)] 99.3 F (37.4 C) (05/30 0801) Pulse Rate:  [64-119] 105 (05/30 1100) Resp:  [16-39] 18 (05/30 1100) BP: (96-179)/(51-119) 166/57 (05/30 1100) SpO2:  [92 %-99 %] 96 % (05/30 1100)  General: lying in bed, NAD Neuro: Opens eyes and looks at examiner but keeps repeating "yes", does not follow commands, unable to answer orientation questions, PERRLA, no forced gaze deviation, blinks to threat bilaterally, no apparent facial asymmetry, spontaneously moving all 4 extremities in bed  Basic Metabolic Panel: Recent Labs  Lab 12/07/21 0201 12/07/21 0519 12/07/21 1245 12/07/21 2132 12/08/21 0013 12/08/21 0324 12/08/21 1229 12/08/21 1742 12/09/21 0026 12/09/21 0655 12/09/21 1434 12/10/21 0146  NA 142  --  142  --  139 142  --   --  141 141  --  140  K 3.1*  --  2.8*   < > 3.1* 2.9* 3.9  --  3.1* 3.1*  --  3.8  CL 102  --  103  --   --  104  --   --   --  106  --  105  CO2 27  --  27  --   --  28  --   --   --  26  --  26  GLUCOSE 127*  --  131*  --   --  119*  --   --   --  152*  --  145*  BUN 30*  --  30*  --   --  27*  --   --   --  35*  --  31*  CREATININE 3.16*  --  3.20*  --   --  3.03*  --   --   --  2.89*  --  2.48*  CALCIUM 8.5*  --  8.6*  --   --  8.6*  --   --   --  8.5*  --  8.8*  MG  --    < >  --   --   --  1.8 2.1 2.1  --  2.1 1.8  --   PHOS 3.2  --   --   --   --  3.2 2.9 3.4  --  4.5 4.2 3.1   < > = values in this interval not displayed.    CBC: Recent Labs  Lab 12/06/21 0432 12/07/21 0202 12/08/21 0013 12/08/21 0324 12/09/21 0026 12/09/21 0924 12/10/21 0146  WBC 12.5* 16.6*  --  21.9*  --  10.8* 18.7*  NEUTROABS 10.0* 13.6*  --   --   --  8.6* 16.2*  HGB 7.1* 8.7* 7.5* 8.6* 7.5* 7.6* 8.2*  HCT 20.8* 24.8* 22.0* 24.5* 22.0*  23.3* 24.6*  MCV 83.5 82.1  --  83.6  --  87.6 87.2  PLT 90* 86*  --  83*  --  79* 134*     Coagulation Studies: No results for input(s): LABPROT, INR in the last 72 hours.  Imaging No new brain imaging overnight  ASSESSMENT AND PLAN: 66 year old female who was brought in on 5/90/2023 via EMS due to altered mental status.     Acute encephalopathy, suspected toxic-metabolic -Patient's encephalopathy is likely multifactorial due to uremia, hyponatremia, possible infection which have all gradually improved as well as medications, prolonged  postictal state, benzodiazepine withdrawal   Recommendations -Patient was on Xanax 1 mg 4 times daily.  Mental status is improving since stopping Depakote and starting Xanax.  We will increase to 0.5 mg 4 times daily.  - Continue keppra '500mg'$  Q24 hour and '250mg'$  after HD - Continue Vimpat '200mg'$  BID and '50mg'$  after HD.  This can potentially be weaned off if no seizures overnight now that we have increased her Xanax -Continue video EEG while we make these changes in medications to look for ictal-interictal activity -Avoid using IV Ativan for the tremor-like activity to avoid excessive sedation.  Please contact neurology if tremor-like activity persists for prolonged (over 15 minutes) -Continue seizure precautions -Plan discussed with Dr. Carlis Abbott   CRITICAL CARE Performed by: Lora Havens   Total critical care time: 32 minutes   Critical care time was exclusive of separately billable procedures and treating other patients.   Critical care was necessary to treat or prevent imminent or life-threatening deterioration.   Critical care was time spent personally by me on the following activities: development of treatment plan with patient and/or surrogate as well as nursing, discussions with consultants, evaluation of patient's response to treatment, examination of patient, obtaining history from patient or surrogate, ordering and performing treatments and  interventions, ordering and review of laboratory studies, ordering and review of radiographic studies, pulse oximetry and re-evaluation of patient's condition.      Zeb Comfort Epilepsy [facility] For questions after 5pm please refer to AMION to reach the Neurologist on call

## 2021-12-10 NOTE — Procedures (Signed)
Cortrak  Person Inserting Tube:  Ander Slade, RD Tube Size:  10 Tube Location:  Left nare Secured by: Bridle Technique Used to Measure Tube Placement:  Marking at nare/corner of mouth Cortrak Secured At:  63 cm  Cortrak Tube Team Note:  Consult received to place a Cortrak feeding tube.   X-ray is required, abdominal x-ray has been ordered by the Cortrak team. Please confirm tube placement before using the Cortrak tube.   If the tube becomes dislodged please keep the tube and contact the Cortrak team at [email or web address] (password [email or web address]) for replacement.  If after hours and replacement cannot be delayed, place a NG tube and confirm placement with an abdominal x-ray.    Ranell Patrick, RD, LDN Clinical Dietitian RD pager # available in [place]  After hours/weekend pager # available in [facility]

## 2021-12-10 NOTE — Consult Note (Addendum)
[place] for Infectious Disease    Date of Admission:  12/05/2021   Total days of inpatient antibiotics 7        Reason for Consult:Concern for Encephalitis    Principal Problem:   Status epilepticus ([place]) Active Problems:   AKI (acute kidney injury) ([place])   Rhabdomyolysis   Essential hypertension   Dyslipidemia   Anxiety   Acute metabolic encephalopathy   Overweight (BMI 25.0-29.9)   Chronic pain   Aspiration pneumonia (H[facility])   Seizure ([place])   Hypoglycemia   Assessment: 66 YF with choric pain and fibromyalgia transferred from [facility] for  Neruology consult. ID engages as note to have worms in stool(documented 5/29).   #Possible Encephalitis #AMS # Status epilepticus #Chronic pain/fibromyalgia on narcotics and flexeril #Worms in stool-note 5/29 #AKI SP HD-improving -MRI showed -CSF appears benign: 600rbc, 8 wbc,  46 PTN, 74 Glc, 46 PTN -I suspect her AMS may have been drug induced initially. She is on flexeril, which if taken to an excess can cause anti-cholinergic effect of AMS, volume deletion(leading to AKI) and lower seizure thresh-hold. UDS + tricyclic which can be 2/2 flexeril(cross react).  -Noted that worms were seen in stool-GIP negative. No overt signs of parasitic infection per CSF studies or MRI thus far. But will order CSF encephalitis work up Recommendations:  -Continue and vancomycin and CTX for now while awaiting work-up -Strongyloides Ab -Added meningitis/encephalitis panel to CSF(sent out to [facility]) -Follow CSF Cx -Follow stool O&P -Continue to monitor clinically Microbiology:   Antibiotics: Vancomycin 5/22, 5/28-29 Unasyn 5/25-5/27 Azithromycin 5/25 Cefepime 5/28 Ceftriaxone 5/29-30  Cultures:  Other 5/28 CSF Cell count 600rbc, 8 wbc,  46 PTN, 74 Glc, 46 PTN Cx NG x2s, no organisms of gram stain HSV 1/2 negative  HPI: Carla Gomez is a 66 y.o. female  with fibromyalgia, HTN, chronic pain and migraine, who transferred  from [facility]  to [place] for EEG monitoring and neuro consult. She was admitted to [facility] on 5/19 with AMS.  EMS was called by neighbors when  she was found shouting and confused. IN the ED Nx 123, Scr 16.28(normal renal function 2017), CK 1375, UA -nitrites/leukocytes, UDS+ BZ, Opiate and tricyclic. She underwent emergent HD. Psychiatry was consulted. Neuro was called as EEG showed seizures. Pt unable to provide Hx today.    Review of Systems: Review of Systems  Unable to perform ROS: Critical illness   Past Medical History:  Diagnosis Date   Cancer ([place])    basal cell   Chronic fatigue    Chronic pain    Dental bridge present    permanent bottom right   Fibromyalgia    Hypertension    Migraine headache     Social History   Tobacco Use   Smoking status: Never   Smokeless tobacco: Never  Vaping Use   Vaping Use: Never used  Substance Use Topics   Alcohol use: Never   Drug use: Never    Family History  Problem Relation Age of Onset   Breast cancer Maternal Aunt        mat great aunt   Scheduled Meds:  ALPRAZolam  0.5 mg Per Tube Q6H   Chlorhexidine Gluconate Cloth  6 each Topical Q0600   darbepoetin (ARANESP) injection - NON-DIALYSIS  100 mcg Subcutaneous Q Sat-1800   feeding supplement (PROSource TF)  45 mL Per Tube Daily   mouth rinse  15 mL Mouth Rinse BID   metoprolol tartrate  5 mg Intravenous Q6H   polyethylene glycol  17 g Per Tube Daily   senna-docusate  1 tablet Per Tube BID   sodium chloride flush  10-40 mL Intracatheter Q12H   Continuous Infusions:  sodium chloride Stopped (12/09/21 2138)   cefTRIAXone (ROCEPHIN)  IV 1 g (12/10/21 1014)   dexmedetomidine (PRECEDEX) IV infusion Stopped (12/09/21 1650)   feeding supplement (VITAL 1.5 CAL) 50 mL/hr at 12/10/21 0400   lacosamide (VIMPAT) IV 200 mg (12/10/21 1050)   levETIRAcetam     vancomycin Stopped (12/09/21 1937)   PRN Meds:.sodium chloride, acetaminophen, dextrose, labetalol, LORazepam, midazolam Allergies   Allergen Reactions   Citrus Other (See Comments)    Triggers migraines   Latex Other (See Comments)    Redness around mouth after dental procedures   Other Other (See Comments)    ALLERGENS: Exposure to Inhalants such as grasses, pollen, trees, mold, mildew, cat dander, cows.  REACTION: post nasal drainage, runny nose and/or chronic sinus infections    OBJECTIVE: Blood pressure (!) 163/92, pulse (!) 104, temperature 99.3 F (37.4 C), temperature source Axillary, resp. rate 19, height '5\' 4"'$  (1.626 m), weight 72.1 kg, SpO2 95 %.  Physical Exam Constitutional:      Comments: Responds to pain  HENT:     Head: Normocephalic and atraumatic.     Right Ear: Tympanic membrane normal.     Left Ear: Tympanic membrane normal.     Nose: Nose normal.     Mouth/Throat:     Mouth: Mucous membranes are moist.  Eyes:     Extraocular Movements: Extraocular movements intact.     Conjunctiva/sclera: Conjunctivae normal.     Pupils: Pupils are equal, round, and reactive to light.  Cardiovascular:     Rate and Rhythm: Normal rate and regular rhythm.     Heart sounds: No murmur heard.   No friction rub. No gallop.  Pulmonary:     Effort: Pulmonary effort is normal.     Breath sounds: Normal breath sounds.  Abdominal:     General: Abdomen is flat.     Palpations: Abdomen is soft.  Skin:    General: Skin is warm and dry.  Psychiatric:        Mood and Affect: Mood normal.    Lab Results Lab Results  Component Value Date   WBC 18.7 (H) 12/10/2021   HGB 8.2 (L) 12/10/2021   HCT 24.6 (L) 12/10/2021   MCV 87.2 12/10/2021   PLT 134 (L) 12/10/2021    Lab Results  Component Value Date   CREATININE 2.48 (H) 12/10/2021   BUN 31 (H) 12/10/2021   NA 140 12/10/2021   K 3.8 12/10/2021   CL 105 12/10/2021   CO2 26 12/10/2021    Lab Results  Component Value Date   ALT 32 12/09/2021   AST 41 12/09/2021   ALKPHOS 138 (H) 12/09/2021   BILITOT 0.5 12/09/2021       Laurice Record,  [place] for Infectious Disease [facility] 12/10/2021, 11:04 AM

## 2021-12-10 NOTE — Progress Notes (Signed)
LTM maint complete - no skin breakdown under:  Fp2 cz p4 f7

## 2021-12-10 NOTE — Progress Notes (Signed)
NAME:  Carla Gomez, MRN:  [id number], DOB:  04/17/1956, LOS: 5 ADMISSION DATE:  12/05/2021, CONSULTATION DATE:  5/26 REFERRING MD:  Hal Hope CHIEF COMPLAINT:  AMS   History of Present Illness:  Patient is encephalopathic and/or intubated. Therefore history has been obtained from chart review.    Carla Gomez, is a 66 y.o. female, who presented to the [place] ED on 5/19 with a chief complaint of altered mental status.  She was found to have an AKI and rhabdomyolysis.   They have a pertinent past medical history of fibromyalgia, chronic pain with migraines, hypertension   She was admitted to [facility] by the hospitalist.  She was transferred to [facility] on 5/25 for persistent altered mental status thought to be nonconvulsive status epilepticus.   PCCM was consulted for worsening mental status, agitation.  Past Medical History:  fibromyalgia, chronic pain with migraines, hypertension  Significant Hospital Events:  5/19-5/25 Admitted to [facility] for [place]  5/26 transferred to [facility]  5/29 increased vimpat for tremors, worms in stool> sent to lab for ID  Consults:  Neuro    Interim History / Subjective:  No seizures overnight. Had worms in stool yesterday. Today waking up slightly, not endorsing complaints.  Objective   Blood pressure (!) 166/57, pulse (!) 105, temperature 99.3 F (37.4 C), temperature source Axillary, resp. rate 18, height '5\' 4"'$  (1.626 m), weight 72.1 kg, SpO2 96 %.        Intake/Output Summary (Last 24 hours) at 12/10/2021 1110 Last data filed at 12/10/2021 0900 Gross per 24 hour  Intake 2089.58 ml  Output 1800 ml  Net 289.58 ml    Filed Weights   12/07/21 0456 12/08/21 0250 12/09/21 0414  Weight: 74.7 kg 72.2 kg 72.1 kg    Examination: General: ill appearing woman lying in bed in NAD HENT: [place]/AT, eyes anicteric Lungs: CTAB, breathing comfortably on [place] Cardiovascular: S1S2, tachycardic, reg rhythm Abdomen: soft, NT Extremities: no  peripheral edema, no cyanosis Neuro: RASS -2, said "yes", "ouch" somewhat appropriately. Shrugged shoulders to answer one question. Falls back asleep quickly. Not following commands. No tremors currently. Skin: warm, dry, no rashes  BUN 31 Cr 2.48 WBC 18.7 H/H 8.2/24.6 Platelets 134 Strongyloides Ab pending GI panel negative Stool O&P pending CSF culture> NGTD  Resolved Hospital Problem list     Assessment & Plan:   Nonconvulsive status epilepticus, now persistent comatose state Acute encephalopathy, suspect secondary to post-ictal state,  metabolic factors such as uremia.  Encephalitis less likely- LP was bland, no white cells -appreciate Neurology's input, continuous EEG -con't keppra, vimpat  -increasing xanax, closer to home dose -follow CSF culture -con't to monitor metabolic studies -ativan PRN for seizures  Aspiration pneumonia secondary to NCSE. Antibiotics escalated on 5/28. Sepsis Plan: -Con't ceftiraxone -will defer to ID if appropriate to stop vancomycin -serologies pending for parasitic infections  Hypoglycemia, resolved -TF -SSI PRN -goal BG 140-180   AKI on CKD stage IV - improving.  Rhabdomyolysis-resolved, secondary to status epilepticus. Hypokalemia, resolved -strict I/Os -renally dose meds, avoid nephrotoxic meds -remove dialysis catheter today   Normocytic anemia Follow H&H No acute signs of bleeding.  Thrombocytopenia- resolving -monitor  Best practice (evaluated daily)  Diet: TF Pain/Anxiety/Delirium protocol (if indicated): n/a VAP protocol (if indicated): n/a DVT prophylaxis: SCDs, [place] heparin GI prophylaxis: PPI Glucose control: none Mobility: bedrest Disposition:ICU Code Status: Full code   Labs   CBC: Recent Labs  Lab 12/06/21 0432 12/07/21 0202 12/08/21 0013 12/08/21 0324 12/09/21  7893 12/09/21 0924 12/10/21 0146  WBC 12.5* 16.6*  --  21.9*  --  10.8* 18.7*  NEUTROABS 10.0* 13.6*  --   --   --  8.6* 16.2*  HGB  7.1* 8.7* 7.5* 8.6* 7.5* 7.6* 8.2*  HCT 20.8* 24.8* 22.0* 24.5* 22.0* 23.3* 24.6*  MCV 83.5 82.1  --  83.6  --  87.6 87.2  PLT 90* 86*  --  83*  --  79* 134*     Basic Metabolic Panel: Recent Labs  Lab 12/07/21 0201 12/07/21 0519 12/07/21 1245 12/07/21 2132 12/08/21 0013 12/08/21 0324 12/08/21 1229 12/08/21 1742 12/09/21 0026 12/09/21 0655 12/09/21 1434 12/10/21 0146  NA 142  --  142  --  139 142  --   --  141 141  --  140  K 3.1*  --  2.8*   < > 3.1* 2.9* 3.9  --  3.1* 3.1*  --  3.8  CL 102  --  103  --   --  104  --   --   --  106  --  105  CO2 27  --  27  --   --  28  --   --   --  26  --  26  GLUCOSE 127*  --  131*  --   --  119*  --   --   --  152*  --  145*  BUN 30*  --  30*  --   --  27*  --   --   --  35*  --  31*  CREATININE 3.16*  --  3.20*  --   --  3.03*  --   --   --  2.89*  --  2.48*  CALCIUM 8.5*  --  8.6*  --   --  8.6*  --   --   --  8.5*  --  8.8*  MG  --    < >  --   --   --  1.8 2.1 2.1  --  2.1 1.8  --   PHOS 3.2  --   --   --   --  3.2 2.9 3.4  --  4.5 4.2 3.1   < > = values in this interval not displayed.    This patient is critically ill with multiple organ system failure; which, requires frequent high complexity decision making, assessment, support, evaluation, and titration of therapies. This was completed through the application of advanced monitoring technologies and extensive interpretation of multiple databases. During this encounter critical care time was devoted to patient care services described in this note for 41 minutes.  Julian Hy, DO [facility] 12/10/2021 11:55 AM

## 2021-12-10 NOTE — Procedures (Signed)
Patient Name: Carla Gomez  MRN: [id number]  Epilepsy Attending: Lora Havens  Referring Physician/Provider: Lorenza Chick, MD Duration: 12/09/2021 1301 to 12/10/2021 1301   Patient history: 66 year old woman with past medical history significant for chronic pain on chronic opiates, chronic benzodiazepine use, fibromyalgia, hyperlipidemia.  She has presented with acute renal failure of unknown etiology now with nonconvulsive status epilepticus.  EEG to evaluate for seizure.   Level of alertness:  lethargic    AEDs during EEG study: Keppra, Vimpat, Xanax   Technical aspects: This EEG study was done with scalp electrodes positioned according to the 10-20 International system of electrode placement. Electrical activity was acquired at a sampling rate of '500Hz'$  and reviewed with a high frequency filter of '70Hz'$  and a low frequency filter of '1Hz'$ . EEG data were recorded continuously and digitally stored.    Description: No clear posterior dominant rhythm was seen.  EEG showed continuous generalized polymorphic sharply contoured predominantly 5 to 8 Hz theta and alpha activity admixed with intermittent generalized 2 to 3 Hz delta slowing.  Intermittent 12 to 15 Hz sharply contoured polymorphic beta activity was noted in the vertex region seen more commonly when patient was stimulated. Hyperventilation and photic stimulation were not performed.      ABNORMALITY - Continuous slow, generalized   IMPRESSION: This study is suggestive of severe diffuse encephalopathy, nonspecific to etiology. No seizures or epileptiform discharges were seen throughout the recording.   Ricketta Colantonio Barbra Sarks

## 2021-12-11 LAB — CBC WITH DIFFERENTIAL/PLATELET
Abs Immature Granulocytes: 0.12 10*3/uL — ABNORMAL HIGH (ref 0.00–0.07)
Basophils Absolute: 0.1 10*3/uL (ref 0.0–0.1)
Basophils Relative: 0 %
Eosinophils Absolute: 0.4 10*3/uL (ref 0.0–0.5)
Eosinophils Relative: 3 %
HCT: 24.7 % — ABNORMAL LOW (ref 36.0–46.0)
Hemoglobin: 8.2 g/dL — ABNORMAL LOW (ref 12.0–15.0)
Immature Granulocytes: 1 %
Lymphocytes Relative: 6 %
Lymphs Abs: 1 10*3/uL (ref 0.7–4.0)
MCH: 29.3 pg (ref 26.0–34.0)
MCHC: 33.2 g/dL (ref 30.0–36.0)
MCV: 88.2 fL (ref 80.0–100.0)
Monocytes Absolute: 0.9 10*3/uL (ref 0.1–1.0)
Monocytes Relative: 5 %
Neutro Abs: 13.5 10*3/uL — ABNORMAL HIGH (ref 1.7–7.7)
Neutrophils Relative %: 85 %
Platelets: 156 10*3/uL (ref 150–400)
RBC: 2.8 MIL/uL — ABNORMAL LOW (ref 3.87–5.11)
RDW: 17.2 % — ABNORMAL HIGH (ref 11.5–15.5)
WBC: 16 10*3/uL — ABNORMAL HIGH (ref 4.0–10.5)
nRBC: 0.1 % (ref 0.0–0.2)

## 2021-12-11 LAB — RENAL FUNCTION PANEL
Albumin: 2.9 g/dL — ABNORMAL LOW (ref 3.5–5.0)
Anion gap: 11 (ref 5–15)
BUN: 27 mg/dL — ABNORMAL HIGH (ref 8–23)
CO2: 26 mmol/L (ref 22–32)
Calcium: 8.4 mg/dL — ABNORMAL LOW (ref 8.9–10.3)
Chloride: 105 mmol/L (ref 98–111)
Creatinine, Ser: 2.14 mg/dL — ABNORMAL HIGH (ref 0.44–1.00)
GFR, Estimated: 25 mL/min — ABNORMAL LOW (ref >60)
Glucose, Bld: 133 mg/dL — ABNORMAL HIGH (ref 70–99)
Phosphorus: 3.3 mg/dL (ref 2.5–4.6)
Potassium: 2.6 mmol/L — CL (ref 3.5–5.1)
Sodium: 142 mmol/L (ref 135–145)

## 2021-12-11 LAB — BASIC METABOLIC PANEL
Anion gap: 7 (ref 5–15)
BUN: 25 mg/dL — ABNORMAL HIGH (ref 8–23)
CO2: 29 mmol/L (ref 22–32)
Calcium: 8.2 mg/dL — ABNORMAL LOW (ref 8.9–10.3)
Chloride: 104 mmol/L (ref 98–111)
Creatinine, Ser: 2 mg/dL — ABNORMAL HIGH (ref 0.44–1.00)
GFR, Estimated: 27 mL/min — ABNORMAL LOW (ref >60)
Glucose, Bld: 157 mg/dL — ABNORMAL HIGH (ref 70–99)
Potassium: 2.9 mmol/L — ABNORMAL LOW (ref 3.5–5.1)
Sodium: 140 mmol/L (ref 135–145)

## 2021-12-11 LAB — N-METHYL-D-ASPARTATE RECPT.IGG: N-methyl-D-Aspartate Recpt.IgG: NEGATIVE

## 2021-12-11 LAB — CSF CULTURE W GRAM STAIN: Culture: NO GROWTH

## 2021-12-11 LAB — STRONGYLOIDES, AB, IGG: Strongyloides, Ab, IgG: NEGATIVE

## 2021-12-11 LAB — GLUCOSE, CAPILLARY
Glucose-Capillary: 126 mg/dL — ABNORMAL HIGH (ref 70–99)
Glucose-Capillary: 132 mg/dL — ABNORMAL HIGH (ref 70–99)
Glucose-Capillary: 145 mg/dL — ABNORMAL HIGH (ref 70–99)
Glucose-Capillary: 148 mg/dL — ABNORMAL HIGH (ref 70–99)
Glucose-Capillary: 150 mg/dL — ABNORMAL HIGH (ref 70–99)
Glucose-Capillary: 166 mg/dL — ABNORMAL HIGH (ref 70–99)

## 2021-12-11 LAB — MAGNESIUM: Magnesium: 1.6 mg/dL — ABNORMAL LOW (ref 1.7–2.4)

## 2021-12-11 MED ORDER — LACOSAMIDE 50 MG PO TABS
100.0000 mg | ORAL_TABLET | Freq: Two times a day (BID) | ORAL | Status: DC
Start: 2021-12-11 — End: 2021-12-12
  Administered 2021-12-11 – 2021-12-12 (×2): 100 mg
  Filled 2021-12-11 (×2): qty 2

## 2021-12-11 MED ORDER — MAGNESIUM SULFATE 2 GM/50ML IV SOLN
2.0000 g | Freq: Once | INTRAVENOUS | Status: AC
Start: 2021-12-11 — End: 2021-12-11
  Administered 2021-12-11: 2 g via INTRAVENOUS
  Filled 2021-12-11: qty 50

## 2021-12-11 MED ORDER — POTASSIUM CHLORIDE 10 MEQ/100ML IV SOLN
10.0000 meq | INTRAVENOUS | Status: AC
  Administered 2021-12-11 (×2): 10 meq via INTRAVENOUS
  Filled 2021-12-11 (×2): qty 100

## 2021-12-11 MED ORDER — ALPRAZOLAM 0.25 MG PO TABS
0.5000 mg | ORAL_TABLET | Freq: Four times a day (QID) | ORAL | Status: DC
  Administered 2021-12-11 – 2021-12-12 (×4): 0.5 mg
  Filled 2021-12-11 (×4): qty 2

## 2021-12-11 MED ORDER — POTASSIUM CHLORIDE 20 MEQ PO PACK
40.0000 meq | PACK | ORAL | Status: AC
  Administered 2021-12-11 (×2): 40 meq
  Filled 2021-12-11 (×2): qty 2

## 2021-12-11 MED ORDER — LEVETIRACETAM 100 MG/ML PO SOLN
500.0000 mg | Freq: Two times a day (BID) | ORAL | Status: DC
Start: 2021-12-11 — End: 2021-12-12
  Administered 2021-12-11 – 2021-12-12 (×2): 500 mg
  Filled 2021-12-11 (×2): qty 5

## 2021-12-11 MED ORDER — AMLODIPINE BESYLATE 5 MG PO TABS
5.0000 mg | ORAL_TABLET | Freq: Every day | ORAL | Status: DC
Start: 2021-12-11 — End: 2021-12-12
  Administered 2021-12-11 – 2021-12-12 (×2): 5 mg
  Filled 2021-12-11 (×2): qty 1

## 2021-12-11 MED ORDER — POTASSIUM CHLORIDE 20 MEQ PO PACK
40.0000 meq | PACK | Freq: Once | ORAL | Status: AC
  Administered 2021-12-11: 40 meq

## 2021-12-11 NOTE — Progress Notes (Signed)
[facility] Progress Note Patient Name: Syanne Looney DOB: 11-16-1955 MRN: [id number]   Date of Service  12/11/2021  HPI/Events of Note  Hypokalemia - K+ = 2.6 and Creatinine = 2.14.  eICU Interventions  Plan: Will replace K+. Repeat BMP at 10 AM.     Intervention Category Major Interventions: Electrolyte abnormality - evaluation and management  Geena Weinhold [place] 12/11/2021, 3:13 AM

## 2021-12-11 NOTE — Evaluation (Signed)
Clinical/Bedside Swallow Evaluation Patient Details  Name: Carla Gomez MRN: [id number] Date of Birth: Aug 04, 1955  Today's Date: 12/11/2021 Time: SLP Start Time (ACUTE ONLY): 46 SLP Stop Time (ACUTE ONLY): 1101 SLP Time Calculation (min) (ACUTE ONLY): 14 min  Past Medical History:  Past Medical History:  Diagnosis Date   Cancer ([place])    basal cell   Chronic fatigue    Chronic pain    Dental bridge present    permanent bottom right   Fibromyalgia    Hypertension    Migraine headache    Past Surgical History:  Past Surgical History:  Procedure Laterality Date   ABDOMINAL HYSTERECTOMY     BREAST BIOPSY Left 09/26/14   lt bx/clip- benign   BREAST BIOPSY Left 11/02/14   benign   COLONOSCOPY WITH PROPOFOL N/A 12/21/2020   Procedure: COLONOSCOPY WITH PROPOFOL;  Surgeon: Lucilla Lame, MD;  Location: [place];  Service: Endoscopy;  Laterality: N/A;   NASAL SINUS SURGERY     HPI:  Carla Gomez, is a 66 y.o. female, who presented to the [place] ED on 5/19 with a chief complaint of altered mental status. and found to have an AKI and rhabdomyolysis. PMH: fibromyalgia, chronic pain with migraines, hypertension. Transferred to [facility] on 5/25 for persistent altered mental status thought to be nonconvulsive status epilepticus. 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    Assessment / Plan / Recommendation  Clinical Impression  Pt mildly confused with dysfluent speech, hesitations and decreased semantics as she continues to recover from recent events. She followed commands and oral-motor exam was unremarkable and oral care performed following NPO status. Not intubated this admission and vocal quality, cough normal. There were no concerns with aspiration over trials of thin (straw, cup), puree or solid texture. Effective mastication and transit for what appeared to be timely  swallow. She held cup with hand over hand assist and will need full assist with feeding initially however allow pt to self feed if possible. Recommend regular texture, thin liquids, straws allowed, pills with thin and full supervision. Do not anticipate pt will have difficulty and ST will sign off at this time. Reconsult if she needs further ST services. SLP Visit Diagnosis: Dysphagia, unspecified (R13.10)    Aspiration Risk  Mild aspiration risk    Diet Recommendation Regular;Thin liquid   Liquid Administration via: Cup;Straw Medication Administration: Whole meds with liquid Supervision: Staff to assist with self feeding;Full supervision/cueing for compensatory strategies Compensations: Slow rate;Small sips/bites;Minimize environmental distractions Postural Changes: Seated upright at 90 degrees    Other  Recommendations Oral Care Recommendations: Oral care BID    Recommendations for follow up therapy are one component of a multi-disciplinary discharge planning process, led by the attending physician.  Recommendations may be updated based on patient status, additional functional criteria and insurance authorization.  Follow up Recommendations No SLP follow up      Assistance Recommended at Discharge Frequent or constant Supervision/Assistance  Functional Status Assessment    Frequency and Duration            Prognosis        Swallow Study   General Date of Onset: 12/11/21 HPI: Carla Gomez, is a 66 y.o. female, who presented to the [place] ED on 5/19 with a chief complaint of altered mental status. and found to have an AKI and rhabdomyolysis. PMH: fibromyalgia, chronic pain with migraines, hypertension. Transferred to [facility] on 5/25 for persistent  altered mental status thought to be nonconvulsive status epilepticus. 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 Type of Study:  Bedside Swallow Evaluation Previous Swallow Assessment:  (none) Diet Prior to this Study: NPO;NG Tube Temperature Spikes Noted: No Respiratory Status: Room air History of Recent Intubation: No Behavior/Cognition: Alert;Cooperative;Pleasant mood;Confused;Requires cueing Oral Cavity Assessment: Within Functional Limits Oral Care Completed by SLP: Yes Oral Cavity - Dentition: Adequate natural dentition Vision:  (questionable) Self-Feeding Abilities: Needs assist Patient Positioning: Upright in bed Baseline Vocal Quality: Normal Volitional Cough: Strong Volitional Swallow: Able to elicit    Oral/Motor/Sensory Function Overall Oral Motor/Sensory Function: Within functional limits   Ice Chips Ice chips: Not tested   Thin Liquid Thin Liquid: Within functional limits Presentation: Cup;Straw    Nectar Thick Nectar Thick Liquid: Not tested   Honey Thick Honey Thick Liquid: Not tested   Puree Puree: Within functional limits   Solid     Solid: Within functional limits      Houston Siren 12/11/2021,11:12 AM

## 2021-12-11 NOTE — Progress Notes (Signed)
LTM EEG discontinued - no skin breakdown at unhook.   

## 2021-12-11 NOTE — Progress Notes (Signed)
NAME:  Carla Gomez, MRN:  [id number], DOB:  1956-04-10, LOS: 6 ADMISSION DATE:  12/05/2021, CONSULTATION DATE:  5/26 REFERRING MD:  Hal Hope CHIEF COMPLAINT:  AMS   History of Present Illness:  Patient is encephalopathic and/or intubated. Therefore history has been obtained from chart review.    Carla Gomez, is a 66 y.o. female, who presented to the [place] ED on 5/19 with a chief complaint of altered mental status.  She was found to have an AKI and rhabdomyolysis.   They have a pertinent past medical history of fibromyalgia, chronic pain with migraines, hypertension   She was admitted to [facility] by the hospitalist.  She was transferred to [facility] on 5/25 for persistent altered mental status thought to be nonconvulsive status epilepticus.   PCCM was consulted for worsening mental status, agitation.  Past Medical History:  fibromyalgia, chronic pain with migraines, hypertension  Significant Hospital Events:  5/19-5/25 Admitted to [facility] for [place]  5/26 transferred to [facility]  5/29 increased vimpat for tremors, worms in stool> sent to lab for ID 5/30 no seizures overnight, worms noted in stool, starting to wake up  Consults:  Neuro ID   Interim History / Subjective:  Remains on cEEG More awake, still confused, no complaints, asking for her glasses   Objective   Blood pressure (!) 162/107, pulse (!) 104, temperature 97.9 F (36.6 C), temperature source Oral, resp. rate 18, height '5\' 4"'$  (1.626 m), weight 78.1 kg, SpO2 100 %.        Intake/Output Summary (Last 24 hours) at 12/11/2021 0837 Last data filed at 12/11/2021 0507 Gross per 24 hour  Intake 990.11 ml  Output 2800 ml  Net -1809.89 ml   Filed Weights   12/08/21 0250 12/09/21 0414 12/11/21 0500  Weight: 72.2 kg 72.1 kg 78.1 kg    Examination: General:  older adult female sitting upright in bed in NAD HEENT: MM pink/moist, pupils 3/reactive, anicertic, cortrak  Neuro:  Awake, oriented to self  only, follows simple commands, MAE CV: rr, NSR PULM:  non labored, CTA, on room air GI: soft, bs+, ND, ND, purwick Extremities: warm/dry, no LE edema  Skin: no rashes   K 2.6 BUN 31> 27 Cr 2.48 > 2.14 WBC 18.7 > 16 H/H 8.2/24.7 Platelets 134 > 156  Afebrile UOP 3.1L/ 24hrs   Strongyloides Ab pending GI panel negative Stool O&P pending CSF culture> NGTD Meningitis/ encephalitis panel pending  Resolved Hospital Problem list     Assessment & Plan:   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   - cont seizure precautions - AEDs per neuro.  Cont Keppra and weaning vimpat given increased xanax closer to home dose> xanxax 0.'5mg'$  QID (home dose is '1mg'$  QID) while remaining on cEEG to look for any seizure activity - ativan prn seizures, avoid just for tremors> neurology wants to be called for tremors lasting > 22mns  - Respiratory support as below/ above, continues to protect airway - Maintain neuro protective measures; goal for eurothermia, euglycemia, eunatermia, normoxia, and PCO2 goal of 35-40 - Nutrition and bowel regiment > per cortrak/ TF with aspiration precautions  - encephalitis/  meninginitis panel added to CSF> pending.  Follow CSF cx - oxy IR stopped 5/29, is on chronic MS contin at home.  Monitor for withdrawals  Aspiration pneumonia secondary to NCSE. Antibiotics escalated on 5/28. Sepsis Worms in stool 5/29 Plan: - ongoing pulm hygiene - currently on room air - will need bedside swallow eval and if mental status continues to improve,  start oral diet - Con't ceftiraxone and vanc per ID pending workup, likely to stop vanc today - pending stool O&P - pending strongyloides ab - remains hemodynamically stable, if anything slightly hypertensive  HTN - continue lopressor '25mg'$  BID - adding back home norvasc   Hypoglycemia, resolved - cont TF - add SSI if > 180   AKI on CKD stage IV - improving.  Rhabdomyolysis-resolved, secondary to status epilepticus. Hypokalemia - s/p KCL replete.  Check Mag in am and repeat BMET - cont strict I/Os, UOP continues to improve  - trend renal indices - avoid nephrotoxins/ renal dose meds  Normocytic anemia - stable H/H - trend on CBC and monitor for bleeding.    Thrombocytopenia- resolving - back in normal limits.  Cont to monitor   Best practice (evaluated daily)  Diet: TF Pain/Anxiety/Delirium protocol (if indicated): n/a VAP protocol (if indicated): n/a DVT prophylaxis: SCDs, [place] heparin GI prophylaxis: PPI Glucose control: q4 Mobility: bedrest Disposition:ICU> if remains stable can likely transfer to PCU and to [facility] as of 6/1 Code Status: Full code   Labs   CBC: Recent Labs  Lab 12/06/21 0432 12/07/21 0202 12/08/21 0013 12/08/21 0324 12/09/21 0026 12/09/21 0924 12/10/21 0146 12/11/21 0221  WBC 12.5* 16.6*  --  21.9*  --  10.8* 18.7* 16.0*  NEUTROABS 10.0* 13.6*  --   --   --  8.6* 16.2* 13.5*  HGB 7.1* 8.7*   < > 8.6* 7.5* 7.6* 8.2* 8.2*  HCT 20.8* 24.8*   < > 24.5* 22.0* 23.3* 24.6* 24.7*  MCV 83.5 82.1  --  83.6  --  87.6 87.2 88.2  PLT 90* 86*  --  83*  --  79* 134* 156   < > = values in this interval not displayed.    Basic Metabolic Panel: Recent Labs  Lab 12/07/21 1245 12/07/21 2132 12/08/21 0324 12/08/21 1229 12/08/21 1742 12/09/21 0026 12/09/21 0655 12/09/21 1434 12/10/21 0146 12/11/21 0221  NA 142   < > 142  --   --  141 141  --  140 142  K 2.8*   < > 2.9* 3.9  --  3.1* 3.1*  --  3.8 2.6*  CL 103  --  104  --   --   --  106  --  105  105  CO2 27  --  28  --   --   --  26  --  26 26  GLUCOSE 131*  --  119*  --   --   --  152*  --  145* 133*  BUN 30*  --  27*  --   --   --  35*  --  31* 27*  CREATININE 3.20*  --  3.03*  --   --   --  2.89*  --  2.48* 2.14*  CALCIUM 8.6*  --  8.6*  --   --   --  8.5*  --  8.8* 8.4*  MG  --   --  1.8 2.1 2.1  --  2.1 1.8  --   --   PHOS  --   --  3.2 2.9 3.4  --  4.5 4.2 3.1 3.3   < > = values in this interval not displayed.   CCT: n/a   Kennieth Rad, ACNP [facility] 12/11/2021, 8:37 AM  See Amion for pager If no response to pager, please call PCCM consult pager After 7:00 pm call Elink

## 2021-12-11 NOTE — Evaluation (Signed)
Occupational Therapy Evaluation Patient Details Name: Carla Gomez MRN: [id number] DOB: 09-12-1955 Today's Date: 12/11/2021   History of Present Illness 66 y.o. F admitted on 5/25 due to nonconvulsive status epilepticus and acute renal failure. PMH significant for chronic pain on chronic opiates, chronic benzodiazepine use, fibromyalgia, hyperlipidemia.   Clinical Impression   Pt admitted for concerns listed above. PTA pt reported that she was independent. Throughout the session she required multimodal cuing and mod-max assist for ADL's. She presents with ataxic movements, weakness, and confusion. This session, pt worked on getting cleaned up, grooming, and washing her hair with a shower cap. She required max assist for all, attempting with all tasks to assist. Recommending AIR to maximize independence. OT will follow acutely.      Recommendations for follow up therapy are one component of a multi-disciplinary discharge planning process, led by the attending physician.  Recommendations may be updated based on patient status, additional functional criteria and insurance authorization.   Follow Up Recommendations  Acute inpatient rehab (3hours/day)    Assistance Recommended at Discharge Frequent or constant Supervision/Assistance  Patient can return home with the following Two people to help with walking and/or transfers;Two people to help with bathing/dressing/bathroom;Assistance with cooking/housework;Assistance with feeding;Direct supervision/assist for medications management;Direct supervision/assist for financial management;Assist for transportation;Help with stairs or ramp for entrance    Functional Status Assessment  Patient has had a recent decline in their functional status and demonstrates the ability to make significant improvements in function in a reasonable and predictable amount of time.  Equipment Recommendations  BSC/3in1;Tub/shower seat;Other (comment) (RW)     Recommendations for Other Services       Precautions / Restrictions Precautions Precautions: Fall Restrictions Weight Bearing Restrictions: No      Mobility Bed Mobility Overal bed mobility: Needs Assistance Bed Mobility: Rolling Rolling: Mod assist, +2 for physical assistance, +2 for safety/equipment         General bed mobility comments: Mod A +2 to roll from one side to the other x2    Transfers                   General transfer comment: deferred due to pt fatigue after cleaning up and grooming      Balance                                           ADL either performed or assessed with clinical judgement   ADL Overall ADL's : Needs assistance/impaired Eating/Feeding: Moderate assistance;Sitting   Grooming: Maximal assistance;Sitting   Upper Body Bathing: Moderate assistance;Sitting   Lower Body Bathing: Maximal assistance;+2 for physical assistance;+2 for safety/equipment;Sitting/lateral leans;Sit to/from stand   Upper Body Dressing : Moderate assistance;Sitting   Lower Body Dressing: Maximal assistance;+2 for physical assistance;+2 for safety/equipment                 General ADL Comments: Pt is limited by ataxic movemetns and weakness, as well as difficulty following commands/sequencing.     Vision Baseline Vision/History: 1 Wears glasses Ability to See in Adequate Light: 0 Adequate Patient Visual Report: No change from baseline Vision Assessment?: Vision impaired- to be further tested in functional context Additional Comments: Pt having difficulty opening her eyes this session, kept reporting that the room was too bright even with lights turned off     Perception     Praxis  Pertinent Vitals/Pain Pain Assessment Pain Assessment: Faces Faces Pain Scale: Hurts little more Pain Location: generalized with movement Pain Descriptors / Indicators: Aching, Grimacing, Discomfort Pain Intervention(s): Monitored  during session     Hand Dominance Right   Extremity/Trunk Assessment Upper Extremity Assessment Upper Extremity Assessment: Generalized weakness;Difficult to assess due to impaired cognition   Lower Extremity Assessment Lower Extremity Assessment: Defer to PT evaluation   Cervical / Trunk Assessment Cervical / Trunk Assessment: Normal   Communication Communication Communication: Expressive difficulties (Choppy speech, diffiuclty finding words and getting them out.)   Cognition Arousal/Alertness: Lethargic Behavior During Therapy: Flat affect Overall Cognitive Status: Impaired/Different from baseline Area of Impairment: Orientation, Attention, Following commands, Safety/judgement, Awareness, Problem solving                 Orientation Level: Disoriented to, Time, Situation Current Attention Level: Selective   Following Commands: Follows one step commands inconsistently, Follows one step commands with increased time Safety/Judgement: Decreased awareness of safety, Decreased awareness of deficits Awareness: Emergent Problem Solving: Slow processing, Decreased initiation, Difficulty sequencing, Requires verbal cues, Requires tactile cues General Comments: Requires max verbal and tactile cuing throughout. With increased time, pt becomes more coherent throuhgout session     General Comments  VSS on RA    Exercises     Shoulder Instructions      Home Living Family/patient expects to be discharged to:: Private residence Living Arrangements: Alone Available Help at Discharge: Family Type of Home: House             Bathroom Shower/Tub: Walk-in Psychologist, prison and probation services: Standard         Additional Comments: Limited home information due to cognition      Prior Functioning/Environment Prior Level of Function : Patient poor historian/Family not available                        OT Problem List: Decreased strength;Decreased range of motion;Decreased  activity tolerance;Impaired balance (sitting and/or standing);Decreased coordination;Decreased cognition;Decreased safety awareness;Decreased knowledge of use of DME or AE;Impaired sensation;Impaired UE functional use      OT Treatment/Interventions: Self-care/ADL training;Therapeutic exercise;Energy conservation;DME and/or AE instruction;Neuromuscular education;Therapeutic activities;Patient/family education;Balance training    OT Goals(Current goals can be found in the care plan section) Acute Rehab OT Goals Patient Stated Goal: To go home OT Goal Formulation: Patient unable to participate in goal setting Time For Goal Achievement: 12/25/21 Potential to Achieve Goals: Good ADL Goals Pt Will Perform Grooming: with set-up;sitting Pt Will Perform Lower Body Bathing: with min assist;sitting/lateral leans;sit to/from stand Pt Will Perform Lower Body Dressing: with min assist;sitting/lateral leans;sit to/from stand Pt Will Transfer to Toilet: with min assist;stand pivot transfer Pt Will Perform Toileting - Clothing Manipulation and hygiene: with min assist;sitting/lateral leans;sit to/from stand Additional ADL Goal #1: Pt will follow 1-2 step commands 100% of the session with no verbal cues. Additional ADL Goal #2: Pt will complete a medication management task with no errors.  OT Frequency: Min 2X/week    Co-evaluation              AM-PAC OT "6 Clicks" Daily Activity     Outcome Measure Help from another person eating meals?: A Lot Help from another person taking care of personal grooming?: A Lot Help from another person toileting, which includes using toliet, bedpan, or urinal?: A Lot Help from another person bathing (including washing, rinsing, drying)?: A Lot Help from another person to put on and taking off  regular upper body clothing?: A Lot Help from another person to put on and taking off regular lower body clothing?: A Lot 6 Click Score: 12   End of Session Nurse  Communication: Mobility status  Activity Tolerance: Patient limited by fatigue;Other (comment) (limited by cogntition) Patient left: in bed;with call bell/phone within reach;with bed alarm set  OT Visit Diagnosis: Unsteadiness on feet (R26.81);Other abnormalities of gait and mobility (R26.89);Muscle weakness (generalized) (M62.81);Other symptoms and signs involving the nervous system (R29.898)                Time: 3016-0109 OT Time Calculation (min): 61 min Charges:  OT General Charges $OT Visit: 1 Visit OT Evaluation $OT Eval Moderate Complexity: 1 Mod OT Treatments $Self Care/Home Management : 23-37 mins $Therapeutic Activity: 8-22 mins  Gian Ybarra H., OTR/L Acute Rehabilitation  Jamiere Gulas Elane Yolanda Bonine 12/11/2021, 7:55 PM

## 2021-12-11 NOTE — Progress Notes (Signed)
LTM maint complete - no skin breakdown under:  F4 P4 O2

## 2021-12-11 NOTE — Procedures (Addendum)
Patient Name: Carla Gomez  MRN: [id number]  Epilepsy Attending: Lora Havens  Referring Physician/Provider: Lorenza Chick, MD Duration: 12/10/2021 1301 to 12/11/2021 1406   Patient history: 66 year old woman with past medical history significant for chronic pain on chronic opiates, chronic benzodiazepine use, fibromyalgia, hyperlipidemia.  She has presented with acute renal failure of unknown etiology now with nonconvulsive status epilepticus.  EEG to evaluate for seizure.   Level of alertness:  lethargic    AEDs during EEG study: Keppra, Vimpat, Xanax   Technical aspects: This EEG study was done with scalp electrodes positioned according to the 10-20 International system of electrode placement. Electrical activity was acquired at a sampling rate of '500Hz'$  and reviewed with a high frequency filter of '70Hz'$  and a low frequency filter of '1Hz'$ . EEG data were recorded continuously and digitally stored.    Description: No clear posterior dominant rhythm was seen. EEG showed continuous generalized polymorphic sharply contoured predominantly 5 to 8 Hz theta and alpha activity admixed with intermittent generalized 2 to 3 Hz delta slowing. Hyperventilation and photic stimulation were not performed.       ABNORMALITY - Continuous slow, generalized   IMPRESSION: This study is suggestive of severe diffuse encephalopathy, nonspecific to etiology. No seizures or epileptiform discharges were seen throughout the recording.   Carla Gomez

## 2021-12-11 NOTE — Progress Notes (Signed)
Subjective: No acute events overnight.  No new concerns.  Patient states she thinks her brain is still foggy  ROS: negative except above  Examination  Vital signs in last 24 hours: Temp:  [97.9 F (36.6 C)-99.2 F (37.3 C)] 98.4 F (36.9 C) (05/31 1139) Pulse Rate:  [72-112] 99 (05/31 0923) Resp:  [17-27] 24 (05/31 0800) BP: (128-174)/(73-132) 156/83 (05/31 0923) SpO2:  [76 %-100 %] 99 % (05/31 0800) Weight:  [78.1 kg] 78.1 kg (05/31 0500)  General: lying in bed, NAD Neuro: Awake, alert, oriented to self, place (hospital), not to time, no aphasia, PERRLA, EOMI, no visual neglect, no facial asymmetry, 4/5 in all 4 extremities, FTN intact bilaterally  Basic Metabolic Panel: Recent Labs  Lab 12/08/21 0324 12/08/21 1229 12/08/21 1742 12/09/21 0026 12/09/21 0655 12/09/21 1434 12/10/21 0146 12/11/21 0221 12/11/21 0949  NA 142  --   --  141 141  --  140 142 140  K 2.9* 3.9  --  3.1* 3.1*  --  3.8 2.6* 2.9*  CL 104  --   --   --  106  --  105 105 104  CO2 28  --   --   --  26  --  '26 26 29  '$ GLUCOSE 119*  --   --   --  152*  --  145* 133* 157*  BUN 27*  --   --   --  35*  --  31* 27* 25*  CREATININE 3.03*  --   --   --  2.89*  --  2.48* 2.14* 2.00*  CALCIUM 8.6*  --   --   --  8.5*  --  8.8* 8.4* 8.2*  MG 1.8 2.1 2.1  --  2.1 1.8  --  1.6*  --   PHOS 3.2 2.9 3.4  --  4.5 4.2 3.1 3.3  --     CBC: Recent Labs  Lab 12/06/21 0432 12/07/21 0202 12/08/21 0013 12/08/21 0324 12/09/21 0026 12/09/21 0924 12/10/21 0146 12/11/21 0221  WBC 12.5* 16.6*  --  21.9*  --  10.8* 18.7* 16.0*  NEUTROABS 10.0* 13.6*  --   --   --  8.6* 16.2* 13.5*  HGB 7.1* 8.7*   < > 8.6* 7.5* 7.6* 8.2* 8.2*  HCT 20.8* 24.8*   < > 24.5* 22.0* 23.3* 24.6* 24.7*  MCV 83.5 82.1  --  83.6  --  87.6 87.2 88.2  PLT 90* 86*  --  83*  --  79* 134* 156   < > = values in this interval not displayed.     Coagulation Studies: No results for input(s): LABPROT, INR in the last 72 hours.  Imaging No new brain  imaging overnight  ASSESSMENT AND PLAN: 66 year old female who was brought in on 5/90/2023 via EMS due to altered mental status.     Acute encephalopathy, suspected toxic-metabolic, improving Seizures, resolved -Patient's encephalopathy is likely multifactorial due to uremia, hyponatremia, possible infection which have all gradually improved as well as medications, prolonged postictal state, benzodiazepine withdrawal (UDS was positive for benzos on arrival but they were held during the hospitalization)   Recommendations -Continue Xanax 0.5 mg 4 times daily.  - Continue keppra '500mg'$  twice daily -Reduce Vimpat to 100 mg twice daily.  If patient continues to improve, will likely reduce to 50 mg twice daily next week and then stop after 1 more week. -Discontinue LTM EEG as no seizures -Continue seizure precautions -Plan discussed with Dr. Carlis Abbott  I have spent  a total of  40  minutes with the patient reviewing hospital notes,  test results, labs and examining the patient as well as establishing an assessment and plan that was discussed personally with the patient.  > 50% of time was spent in direct patient care.   Zeb Comfort Epilepsy [facility] For questions after 5pm please refer to AMION to reach the Neurologist on call

## 2021-12-11 NOTE — Progress Notes (Signed)
Carla Gomez for Infectious Disease  Date of Admission:  12/05/2021   Total days of inpatient antibiotics 7  Principal Problem:   Status epilepticus ([place]) Active Problems:   AKI (acute kidney injury) ([place])   Rhabdomyolysis   Essential hypertension   Dyslipidemia   Anxiety   Acute metabolic encephalopathy   Overweight (BMI 25.0-29.9)   Chronic pain   Aspiration pneumonia ([facility])   Seizure ([place])   Hypoglycemia          Assessment: Carla Gomez with choric pain and fibromyalgia transferred from [facility] for  Neruology consult. ID engages as note to have worms in stool(documented 5/29).    #Possible Encephalitis #AMS # Status epilepticus #Chronic pain/fibromyalgia on narcotics and flexeril #Worms in stool-note 5/29 #AKI SP HD-improving -MRI brain showed no acute findings -CSF appears benign: 600rbc, 8 wbc,  46 PTN, 74 Glc, 46 PTN -I suspect her AMS may have been drug induced initially. She is on flexeril, which if taken to an excess can cause anti-cholinergic effect of AMS, volume deletion(leading to AKI) and lower seizure thresh-hold. UDS + tricyclic which can be 2/2 flexeril(cross react).  -Noted that worms(flat in shape per nursing) were seen in stool-GIP negative. No overt signs of parasitic infection per CSF studies or MRI thus far. But will order CSF encephalitis work up -Neurology following per seizures Recommendations:  -D/C vancomycin and ceftriaxone -Follow-up Strongyloides Ab -Added meningitis/encephalitis panel to CSF(sent out to [facility]) -Follow stool O&P -Continue to monitor clinically Microbiology:   Antibiotics: Vancomycin 5/22, 5/28-29 Unasyn 5/25-5/27 Azithromycin 5/25 Cefepime 5/28 Ceftriaxone 5/29-30   Cultures:   Other 5/28 CSF Cell count 600rbc, 8 wbc,  46 PTN, 74 Glc, 46 PTN Cx NG x2s, no organisms of gram stain HSV 1/2 negative    SUBJECTIVE: Pt able to provide limited history. Denies travel outside the [place]. Denies eating uncooked  meat  Review of Systems: Review of Systems  All other systems reviewed and are negative.   Scheduled Meds:  ALPRAZolam  0.5 mg Per Tube Q6H   amLODipine  5 mg Per Tube Daily   Chlorhexidine Gluconate Cloth  6 each Topical Q0600   darbepoetin (ARANESP) injection - NON-DIALYSIS  100 mcg Subcutaneous Q Sat-1800   feeding supplement (PROSource TF)  45 mL Per Tube Daily   heparin injection (subcutaneous)  5,000 Units Subcutaneous Q8H   lacosamide  100 mg Per Tube BID   levETIRAcetam  500 mg Per Tube BID   mouth rinse  15 mL Mouth Rinse BID   metoprolol tartrate  25 mg Per Tube BID   polyethylene glycol  17 g Per Tube Daily   potassium chloride  40 mEq Per Tube Q4H   senna-docusate  1 tablet Per Tube BID   sodium chloride flush  10-40 mL Intracatheter Q12H   Continuous Infusions:  sodium chloride Stopped (12/09/21 2138)   feeding supplement (VITAL 1.5 CAL) 50 mL/hr at 12/10/21 0400   PRN Meds:.sodium chloride, acetaminophen, dextrose, labetalol, LORazepam Allergies  Allergen Reactions   Citrus Other (See Comments)    Triggers migraines   Latex Other (See Comments)    Redness around mouth after dental procedures   Other Other (See Comments)    ALLERGENS: Exposure to Inhalants such as grasses, pollen, trees, mold, mildew, cat dander, cows.  REACTION: post nasal drainage, runny nose and/or chronic sinus infections    OBJECTIVE: Vitals:   12/11/21 1100 12/11/21 1139 12/11/21 1200 12/11/21 1506  BP: (!) 161/90  Marland Kitchen)  156/81   Pulse: 98  95   Resp:   19   Temp:  98.4 F (36.9 C)  98.3 F (36.8 C)  TempSrc:  Oral  Oral  SpO2:   94%   Weight:      Height:       Body mass index is 29.55 kg/m.  Physical Exam Constitutional:      Appearance: Normal appearance.  HENT:     Head: Normocephalic and atraumatic.     Right Ear: Tympanic membrane normal.     Left Ear: Tympanic membrane normal.     Nose: Nose normal.     Mouth/Throat:     Mouth: Mucous membranes are moist.   Eyes:     Extraocular Movements: Extraocular movements intact.     Conjunctiva/sclera: Conjunctivae normal.     Pupils: Pupils are equal, round, and reactive to light.  Cardiovascular:     Rate and Rhythm: Normal rate and regular rhythm.     Heart sounds: No murmur heard.   No friction rub. No gallop.  Pulmonary:     Effort: Pulmonary effort is normal.     Breath sounds: Normal breath sounds.  Abdominal:     General: Abdomen is flat.     Palpations: Abdomen is soft.  Musculoskeletal:        General: Normal range of motion.  Skin:    General: Skin is warm and dry.  Neurological:     Mental Status: She is alert and oriented to person, place, and time.  Psychiatric:        Mood and Affect: Mood normal.      Lab Results Lab Results  Component Value Date   WBC 16.0 (H) 12/11/2021   HGB 8.2 (L) 12/11/2021   HCT 24.7 (L) 12/11/2021   MCV 88.2 12/11/2021   PLT 156 12/11/2021    Lab Results  Component Value Date   CREATININE 2.00 (H) 12/11/2021   BUN 25 (H) 12/11/2021   NA 140 12/11/2021   K 2.9 (L) 12/11/2021   CL 104 12/11/2021   CO2 29 12/11/2021    Lab Results  Component Value Date   ALT 32 12/09/2021   AST 41 12/09/2021   ALKPHOS 138 (H) 12/09/2021   BILITOT 0.5 12/09/2021        Laurice Record, [place] for Infectious Disease [facility] 12/11/2021, 3:12 PM

## 2021-12-12 DIAGNOSIS — E876 Hypokalemia: Secondary | ICD-10-CM

## 2021-12-12 DIAGNOSIS — D649 Anemia, unspecified: Secondary | ICD-10-CM

## 2021-12-12 DIAGNOSIS — B839 Helminthiasis, unspecified: Secondary | ICD-10-CM

## 2021-12-12 DIAGNOSIS — D696 Thrombocytopenia, unspecified: Secondary | ICD-10-CM

## 2021-12-12 DIAGNOSIS — E872 Acidosis, unspecified: Secondary | ICD-10-CM

## 2021-12-12 DIAGNOSIS — E871 Hypo-osmolality and hyponatremia: Secondary | ICD-10-CM

## 2021-12-12 LAB — RENAL FUNCTION PANEL
Albumin: 3 g/dL — ABNORMAL LOW (ref 3.5–5.0)
Anion gap: 9 (ref 5–15)
BUN: 27 mg/dL — ABNORMAL HIGH (ref 8–23)
CO2: 26 mmol/L (ref 22–32)
Calcium: 8.5 mg/dL — ABNORMAL LOW (ref 8.9–10.3)
Chloride: 109 mmol/L (ref 98–111)
Creatinine, Ser: 1.81 mg/dL — ABNORMAL HIGH (ref 0.44–1.00)
GFR, Estimated: 30 mL/min — ABNORMAL LOW (ref >60)
Glucose, Bld: 160 mg/dL — ABNORMAL HIGH (ref 70–99)
Phosphorus: 2.3 mg/dL — ABNORMAL LOW (ref 2.5–4.6)
Potassium: 3.7 mmol/L (ref 3.5–5.1)
Sodium: 144 mmol/L (ref 135–145)

## 2021-12-12 LAB — GLUCOSE, CAPILLARY
Glucose-Capillary: 144 mg/dL — ABNORMAL HIGH (ref 70–99)
Glucose-Capillary: 158 mg/dL — ABNORMAL HIGH (ref 70–99)
Glucose-Capillary: 173 mg/dL — ABNORMAL HIGH (ref 70–99)
Glucose-Capillary: 139 mg/dL — ABNORMAL HIGH (ref 70–99)
Glucose-Capillary: 124 mg/dL — ABNORMAL HIGH (ref 70–99)
Glucose-Capillary: 110 mg/dL — ABNORMAL HIGH (ref 70–99)

## 2021-12-12 LAB — CBC WITH DIFFERENTIAL/PLATELET
Abs Immature Granulocytes: 0.08 10*3/uL — ABNORMAL HIGH (ref 0.00–0.07)
Basophils Absolute: 0.1 10*3/uL (ref 0.0–0.1)
Basophils Relative: 1 %
Eosinophils Absolute: 0.2 10*3/uL (ref 0.0–0.5)
Eosinophils Relative: 1 %
HCT: 24.5 % — ABNORMAL LOW (ref 36.0–46.0)
Hemoglobin: 7.8 g/dL — ABNORMAL LOW (ref 12.0–15.0)
Immature Granulocytes: 1 %
Lymphocytes Relative: 8 %
Lymphs Abs: 1 10*3/uL (ref 0.7–4.0)
MCH: 28.9 pg (ref 26.0–34.0)
MCHC: 31.8 g/dL (ref 30.0–36.0)
MCV: 90.7 fL (ref 80.0–100.0)
Monocytes Absolute: 0.9 10*3/uL (ref 0.1–1.0)
Monocytes Relative: 7 %
Neutro Abs: 10.6 10*3/uL — ABNORMAL HIGH (ref 1.7–7.7)
Neutrophils Relative %: 82 %
Platelets: 202 10*3/uL (ref 150–400)
RBC: 2.7 MIL/uL — ABNORMAL LOW (ref 3.87–5.11)
RDW: 17.8 % — ABNORMAL HIGH (ref 11.5–15.5)
WBC: 12.8 10*3/uL — ABNORMAL HIGH (ref 4.0–10.5)
nRBC: 0.2 % (ref 0.0–0.2)

## 2021-12-12 LAB — O&P RESULT

## 2021-12-12 LAB — OVA + PARASITE EXAM

## 2021-12-12 MED ORDER — POLYETHYLENE GLYCOL 3350 17 G PO PACK
17.0000 g | PACK | Freq: Every day | ORAL | Status: DC
  Administered 2021-12-13 – 2021-12-18 (×5): 17 g via ORAL
  Filled 2021-12-12 (×5): qty 1

## 2021-12-12 MED ORDER — LEVETIRACETAM 100 MG/ML PO SOLN: 500.0000 mg | Freq: Two times a day (BID) | ORAL | Status: DC

## 2021-12-12 MED ORDER — SENNOSIDES-DOCUSATE SODIUM 8.6-50 MG PO TABS
1.0000 | ORAL_TABLET | Freq: Two times a day (BID) | ORAL | Status: DC
  Administered 2021-12-13 – 2021-12-18 (×9): 1 via ORAL
  Filled 2021-12-12 (×10): qty 1

## 2021-12-12 MED ORDER — ADULT MULTIVITAMIN W/MINERALS CH
1.0000 | ORAL_TABLET | Freq: Every day | ORAL | Status: DC
  Administered 2021-12-12 – 2021-12-18 (×7): 1 via ORAL
  Filled 2021-12-12 (×7): qty 1

## 2021-12-12 MED ORDER — ENSURE ENLIVE PO LIQD
237.0000 mL | Freq: Three times a day (TID) | ORAL | Status: DC
  Administered 2021-12-13 – 2021-12-18 (×14): 237 mL via ORAL

## 2021-12-12 MED ORDER — METOPROLOL TARTRATE 25 MG PO TABS
25.0000 mg | ORAL_TABLET | Freq: Two times a day (BID) | ORAL | Status: DC
  Administered 2021-12-12 – 2021-12-13 (×2): 25 mg via ORAL
  Filled 2021-12-12 (×2): qty 1

## 2021-12-12 MED ORDER — POTASSIUM CHLORIDE 20 MEQ PO PACK
40.0000 meq | PACK | Freq: Once | ORAL | Status: AC
  Administered 2021-12-12: 40 meq
  Filled 2021-12-12: qty 2

## 2021-12-12 MED ORDER — DEXTROSE 5 % IV SOLN
20.0000 mmol | Freq: Once | INTRAVENOUS | Status: AC
  Administered 2021-12-12: 20 mmol via INTRAVENOUS
  Filled 2021-12-12: qty 6.67

## 2021-12-12 MED ORDER — AMLODIPINE BESYLATE 5 MG PO TABS: 5.0000 mg | ORAL_TABLET | Freq: Every day | ORAL | Status: DC

## 2021-12-12 MED ORDER — LEVETIRACETAM 500 MG PO TABS
500.0000 mg | ORAL_TABLET | Freq: Two times a day (BID) | ORAL | Status: DC
  Administered 2021-12-12 – 2021-12-16 (×8): 500 mg via ORAL
  Filled 2021-12-12 (×8): qty 1

## 2021-12-12 MED ORDER — AMLODIPINE BESYLATE 10 MG PO TABS
10.0000 mg | ORAL_TABLET | Freq: Every day | ORAL | Status: DC
  Administered 2021-12-13 – 2021-12-18 (×6): 10 mg via ORAL
  Filled 2021-12-12 (×6): qty 1

## 2021-12-12 MED ORDER — LACOSAMIDE 50 MG PO TABS
100.0000 mg | ORAL_TABLET | Freq: Two times a day (BID) | ORAL | Status: DC
  Administered 2021-12-12 – 2021-12-13 (×3): 100 mg via ORAL
  Filled 2021-12-12 (×3): qty 2

## 2021-12-12 MED ORDER — ALPRAZOLAM 0.25 MG PO TABS: 0.5000 mg | ORAL_TABLET | Freq: Four times a day (QID) | ORAL | Status: DC

## 2021-12-12 MED ORDER — ALPRAZOLAM 0.5 MG PO TABS
0.5000 mg | ORAL_TABLET | Freq: Three times a day (TID) | ORAL | Status: DC
  Administered 2021-12-12 – 2021-12-15 (×7): 0.5 mg via ORAL
  Filled 2021-12-12 (×7): qty 1

## 2021-12-12 NOTE — Assessment & Plan Note (Signed)
Resolved

## 2021-12-12 NOTE — Assessment & Plan Note (Signed)
O+P negative.  This was a single unverified occurrence, likely mistaken observation.

## 2021-12-12 NOTE — Progress Notes (Signed)
Nutrition Follow-up  DOCUMENTATION CODES:   Not applicable  INTERVENTION:   - Ensure Enlive po TID, each supplement provides 350 kcal and 20 grams of protein  - Magic Cup TID with meals, each supplement provides 290 kcal and 9 grams of protein  - MVI with minerals daily  - Encourage PO intake  NUTRITION DIAGNOSIS:   Inadequate oral intake related to lethargy/confusion as evidenced by NPO status.  Progressing, pt now on Regular diet  GOAL:   Patient will meet greater than or equal to 90% of their needs  Progressing  MONITOR:   Diet advancement, Labs, Weight trends, TF tolerance, I & O's  REASON FOR ASSESSMENT:   Consult Enteral/tube feeding initiation and management  ASSESSMENT:   66 year old female who [facility] on [facility]/25 due to status epilepticus. Pt [facility] due to AMS, AKI, and aspiration PNA. Nephrology was consulted and pt was started on HD. PMH of fibromyalgia, HTN, chronic pain, basal cell carcinoma.  05/27 - NG tube placed (tip projecting over proximal duodenum per x-ray) 05/28 - s/p lumbar puncture, NG tube placed 05/30 - NG tube exchanged for Cortrak tube and bridle 05/31 - SLP recommending regular diet with thin liquids 06/01 - diet advanced to regular with thin liquids  Discussed pt with RN and MD. Pt's mental status is improving. Pt working with therapies this morning. Pt consumed 50% of breakfast meal tray today but required a lot of encouragement to do so due to becoming disinterested or distracted.  Discussed with MD about transitioning from continuous tube feeds to nocturnal tube feeds to aid pt in meeting kcal and protein needs as mentation and PO intake improve. MD would prefer for pt to have Cortrak removed today and to attempt to maximize kcal and protein intake via PO route. Will order Ensure supplements between meals and Magic Cup supplements with meals. Agree with Regular diet order to provide the most  food options. Will add daily MVI with minerals.  Verbal with readback order received to discontinue Cortrak tube. Order placed. RD will discontinue tube feeding orders at this time.  Admit weight: 74.7 kg Current weight: 72.5 kg  Current TF: Vital 1.5 @ 50 ml/hr, ProSource TF 45 ml daily  Meal Completion: 50% x 1 documented meal  Medications reviewed and include: aranesp, miralax, senna  Labs reviewed: BUN 27, creatinine 1.81, phosphorus 2.3, magnesium 1.6 on 5/31, WBC 12.8, hemoglobin 7.8  UOP: 1910 ml x 24 hours I/O's: -2.7 L since admit  Diet Order:   Diet Order             Diet regular Room service appropriate? Yes; Fluid consistency: Thin  Diet effective now                   EDUCATION NEEDS:   No education needs have been identified at this time  Skin:  Skin Assessment: Reviewed RN Assessment  Last BM:  12/12/21 large type 7  Height:   Ht Readings from Last 1 Encounters:  12/06/21 '5\' 4"'$  (1.626 m)    Weight:   Wt Readings from Last 1 Encounters:  12/12/21 72.5 kg    BMI:  Body mass index is 27.44 kg/m.  Estimated Nutritional Needs:   Kcal:  1800-2000  Protein:  90-110 grams  Fluid:  1.8-2.0 L    Gustavus Bryant, MS, RD, LDN Inpatient Clinical Dietitian Please see AMiON for contact information.

## 2021-12-12 NOTE — Progress Notes (Signed)
Pharmacy Electrolyte Replacement  Recent Labs:  Recent Labs    12/11/21 0221 12/11/21 0949 12/12/21 0137  K 2.6*   < > 3.7  MG 1.6*  --   --   PHOS 3.3  --  2.3*  CREATININE 2.14*   < > 1.81*   < > = values in this interval not displayed.    Low Critical Values (K </= 2.5, Phos </= 1, Mg </= 1) Present: None  Plan: KPhos 66mol IV  Belynda Pagaduan D. DMina Marble PharmD, BCPS, BPort Ewen6/07/2021, 11:42 AM

## 2021-12-12 NOTE — Assessment & Plan Note (Addendum)
Resolved

## 2021-12-12 NOTE — Progress Notes (Signed)
[facility] ADULT ICU REPLACEMENT PROTOCOL   The patient does apply for the [facility] Adult ICU Electrolyte Replacment Protocol based on the criteria listed below:   1.Exclusion criteria: TCTS patients, ECMO patients, and Dialysis patients 2. Is GFR >/= 30 ml/min? Yes.    Patient's GFR today is 30 3. Is SCr </= 2? Yes.   Patient's SCr is 1.81 mg/dL 4. Did SCr increase >/= 0.5 in 24 hours? No. 5.Pt's weight >40kg  Yes.   6. Abnormal electrolyte(s): K+ 3.7  7. Electrolytes replaced per protocol 8.  Call MD STAT for K+ </= 2.5, Phos </= 1, or Mag </= 1 Physician:  n/a  Carla Gomez 12/12/2021 3:37 AM

## 2021-12-12 NOTE — Progress Notes (Signed)
Pt transported to 3W 15 by [employer] RN and [place] NT. All of pt belongings and chart transported with patient. Report given to [place].

## 2021-12-12 NOTE — Progress Notes (Signed)
[place] for Infectious Disease  Date of Admission:  12/05/2021   Total days of inpatient antibiotics 8  Principal Problem:   Acute metabolic encephalopathy Active Problems:   Acute renal failure (ARF) (HCC)   Rhabdomyolysis   Essential hypertension   Dyslipidemia   Anxiety   Overweight (BMI 25.0-29.9)   Chronic pain   Aspiration pneumonia (HCC)   Status epilepticus (HCC)   Hypokalemia   Metabolic acidosis   Worms in stool   Hyponatremia   Thrombocytopenia (HCC)   Normocytic anemia          Assessment: 66 YF with choric pain and fibromyalgia transferred from [facility] for  Neruology consult. ID engages as note to have worms in stool(documented 5/29).    #Possible Encephalitis #AMS # Status epilepticus #Chronic pain/fibromyalgia on narcotics and flexeril #Worms in stool-note 5/29 #AKI SP HD-improving -MRI brain showed no acute findings -CSF appears benign: 600rbc, 8 wbc,  46 PTN, 74 Glc, 46 PTN -I suspect her AMS may have been drug induced initially. She is on flexeril, which if taken to an excess can cause anti-cholinergic effect of AMS, volume deletion(leading to AKI) and lower seizure thresh-hold. UDS + tricyclic which can be 2/2 flexeril(cross react).  -Noted that worms(flat in shape per nursing) were seen in stool-GIP negative, strongyloides IgG negative. No overt signs of parasitic infection per CSF studies or MRI thus far.  CSF encephalitis work up ordered. -Neurology following per seizures Recommendations:  -Added meningitis/encephalitis panel to CSF(sent out to [facility]) -Follow stool O&P -Continue to monitor clinically Microbiology:   Antibiotics: Vancomycin 5/22, 5/28-29 Unasyn 5/25-5/27 Azithromycin 5/25 Cefepime 5/28 Ceftriaxone 5/29-30   Cultures:   Other 5/28 CSF Cell count 600rbc, 8 wbc,  46 PTN, 74 Glc, 46 PTN Cx NG x2s, no organisms of gram stain HSV 1/2 negative    SUBJECTIVE: Continues to provide limited history.  Review of  Systems: Review of Systems  All other systems reviewed and are negative.   Scheduled Meds:  ALPRAZolam  0.5 mg Per Tube Q6H   amLODipine  5 mg Per Tube Daily   Chlorhexidine Gluconate Cloth  6 each Topical Q0600   darbepoetin (ARANESP) injection - NON-DIALYSIS  100 mcg Subcutaneous Q Sat-1800   feeding supplement (PROSource TF)  45 mL Per Tube Daily   heparin injection (subcutaneous)  5,000 Units Subcutaneous Q8H   lacosamide  100 mg Per Tube BID   levETIRAcetam  500 mg Per Tube BID   mouth rinse  15 mL Mouth Rinse BID   metoprolol tartrate  25 mg Per Tube BID   polyethylene glycol  17 g Per Tube Daily   senna-docusate  1 tablet Per Tube BID   sodium chloride flush  10-40 mL Intracatheter Q12H   Continuous Infusions:  sodium chloride 10 mL/hr at 12/12/21 0226   feeding supplement (VITAL 1.5 CAL) 50 mL/hr at 12/12/21 1000   PRN Meds:.sodium chloride, acetaminophen, dextrose, labetalol, LORazepam Allergies  Allergen Reactions   Citrus Other (See Comments)    Triggers migraines   Latex Other (See Comments)    Redness around mouth after dental procedures   Other Other (See Comments)    ALLERGENS: Exposure to Inhalants such as grasses, pollen, trees, mold, mildew, cat dander, cows.  REACTION: post nasal drainage, runny nose and/or chronic sinus infections    OBJECTIVE: Vitals:   12/12/21 0733 12/12/21 0800 12/12/21 0900 12/12/21 1000  BP:  (!) 177/99 (!) 169/95 (!) 157/95  Pulse:  100 (!) 103 (!) 105  Resp:  (!) '26 18 20  '$ Temp: 98.4 F (36.9 C)     TempSrc: Oral     SpO2:  97% 98% 99%  Weight:      Height:       Body mass index is 27.44 kg/m.  Physical Exam Constitutional:      Appearance: Normal appearance.  HENT:     Head: Normocephalic and atraumatic.     Right Ear: Tympanic membrane normal.     Left Ear: Tympanic membrane normal.     Nose: Nose normal.     Mouth/Throat:     Mouth: Mucous membranes are moist.  Eyes:     Extraocular Movements: Extraocular  movements intact.     Conjunctiva/sclera: Conjunctivae normal.     Pupils: Pupils are equal, round, and reactive to light.  Cardiovascular:     Rate and Rhythm: Normal rate and regular rhythm.     Heart sounds: No murmur heard.   No friction rub. No gallop.  Pulmonary:     Effort: Pulmonary effort is normal.     Breath sounds: Normal breath sounds.  Abdominal:     General: Abdomen is flat.     Palpations: Abdomen is soft.  Musculoskeletal:        General: Normal range of motion.  Skin:    General: Skin is warm and dry.  Neurological:     Mental Status: She is alert and oriented to person, place, and time.  Psychiatric:        Mood and Affect: Mood normal.      Lab Results Lab Results  Component Value Date   WBC 12.8 (H) 12/12/2021   HGB 7.8 (L) 12/12/2021   HCT 24.5 (L) 12/12/2021   MCV 90.7 12/12/2021   PLT 202 12/12/2021    Lab Results  Component Value Date   CREATININE 1.81 (H) 12/12/2021   BUN 27 (H) 12/12/2021   NA 144 12/12/2021   K 3.7 12/12/2021   CL 109 12/12/2021   CO2 26 12/12/2021    Lab Results  Component Value Date   ALT 32 12/09/2021   AST 41 12/09/2021   ALKPHOS 138 (H) 12/09/2021   BILITOT 0.5 12/09/2021        Laurice Record, [place] [facility] 12/12/2021, 11:22 AM

## 2021-12-12 NOTE — Hospital Course (Addendum)
Carla Gomez is a 66 y.o. Female with PMH HTN, chronic pain, migraine/fibromyalgia who presented after being found down. Workup also showed AKI. Evidently, neighbors heard her hollering, called EMS.  In the ER at [facility], Cr was 16.28 and she had BUN 123 and acidosis.    5/19: Admitted to [facility], started on fluids, renal US with medical disease 5/20: Nephrology consulted, minimal UOP, HD cath placed, underwent dialysis on 5/20  5/21: questionable improved mentation 5/22: HD repeated 5/23: Psychiatry consulted 5/25: 3rd HD session, EEG obtained due to persistent poor mentation and showed continuous sharp wave discharges concerning for nonconvulsive status epilepticus, transferred to [facility] 5/26: Worsening agitation, transferred to ICU 5/28: LP performed 5/29: MRI brain with chronic small vessel disease only 5/30: ID consulted due to "worms" being observed in stool. Possibly considered a mistaken observation; no recurrence. 5/31: Mentation improved, transferred [facility]

## 2021-12-12 NOTE — Progress Notes (Signed)
Progress Note   Patient: Carla Gomez LKJ:[id number] DOB: February 02, 1956 DOA: 12/05/2021     7 DOS: the patient was seen and examined on 12/12/2021 at 13:30      Brief hospital course: Mrs. Laguna is a 66 y.o. F with HTN and the migraine-fibromyalgia chronic pain syndrome who presented with confusion, found to have renal failure.  Evidently, neighbors heard her hollering, called EMS.  In the ER at [facility], Cr was 16.28 and she had BUN 123 and acidosis.     5/19: Admitted to [facility], started on fluids, renal US with medical disease 5/20: Nephrology consulted, minimal UOP, HD cath placed, underwent dialysis on 5/20  5/21: Maybe improved mentation 5/22: HD repeated 5/23: Psychiatry consulted 5/25: 3rd HD session, EEG obtained due to persistent poor mentation and showed continuous sharp wave discharges concerning for nonconvulsive status epilepticus, transferred to [facility] 5/26: Worsening agitation, transferred to ICU 5/28: LP performed 5/29: MRI brain with chronic small vessel disease only 5/30: ID consulted due to "worms" being observed in stool 5/31: Mentation improved, transferred [facility]      Assessment and Plan: * Acute metabolic encephalopathy Due to renal failure, in setting of high dose oral morphine use.  Probably Xanax, Flexeril+TCAs also contributing.  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  Seizures ceased.  She continues to have intermittent somnolence.   - Space Xanax out slightly, this is not safe long term for this patient - Continue Vimpat and Keppra per Neurology     Status epilepticus [facility]) - Consult Neurology - Continue Vimpat and Keppra per Neurology   Normocytic anemia Hgb trending down.  No iron deficiency  Thrombocytopenia (HCC) Platelets nadir 83K in setting of renal failure, pneumonia.  Now resolved.  Hyponatremia Resolved  Worms in stool - Consult ID  Metabolic acidosis Resolved  Hypokalemia Supplemented and  resovled  Aspiration pneumonia ([place]) Sepsis ruled out. Pneumonia treated and resolved.  Chronic pain - Hold Norco - Hold Morphine   Overweight (BMI 25.0-29.9) Chronic.  Anxiety Only on Xanax as an outpatient, no maintenance medication I can see. - Continue Xanax, space out, not safe long term  Dyslipidemia Chronic.  Holding statin due to recent rhabdomyolysis.  Essential hypertension BP elevated - Continue amlodipine, metoprolol, increase dose  Rhabdomyolysis Improved from her original admission at [facility].  Continue to hold statin. Check CK in AM.  Acute renal failure (ARF) (HCC) Creatinine >16 on admission, CKD IV ruled out, she probably has chronic kidney disease but the stage couldn't be clinically determined at this point.          Subjective: Very sleepy.  Denies headache, chest pain, dyspnea, abdominal pain.     Physical Exam: Vitals:   12/12/21 1300 12/12/21 1400 12/12/21 1500 12/12/21 1553  BP: (!) 150/88 (!) 163/88 (!) 163/77 (!) 144/83  Pulse: 98 95 66 (!) 106  Resp: (!) 21 20 (!) 0 18  Temp:   98.6 F (37 C) 98.3 F (36.8 C)  TempSrc:   Axillary Oral  SpO2: 96% 95% 96% 99%  Weight:      Height:       Sleep adult female, too sleepy to finish her lunch, having tray in front of her on the table RRR, no murmurs, no peripheral edema Respiratory rate slow but easy and unlabored, no rales or wheezes Abdomen soft no tenderness palpation or guarding, no ascites or distention Attention diminished, sleepy, moves upper extremities with generalized weakness but symmetric strength  Data Reviewed: Nursing notes reviewed,  vital signs reviewed Basic metabolic panel shows improving creatinine Hemogram shows anemia  Family Communication: Attempted call to daughter, no answer    Disposition: Status is: Inpatient         Author: Edwin Dada, MD 12/12/2021 5:33 PM  For on call review [email or web address].

## 2021-12-12 NOTE — Assessment & Plan Note (Addendum)
Hgb stable.  No iron deficiency

## 2021-12-12 NOTE — Evaluation (Signed)
Physical Therapy Evaluation Patient Details Name: Carla Gomez MRN: [id number] DOB: April 24, 1956 Today's Date: 12/12/2021  History of Present Illness  67 y.o. F admitted on 5/25 due to nonconvulsive status epilepticus and acute renal failure. PMH significant for chronic pain on chronic opiates, chronic benzodiazepine use, fibromyalgia, hyperlipidemia.  Clinical Impression  Pt admitted with above diagnosis and presents to PT with functional limitations due to deficits listed below (See PT problem list). Pt needs skilled PT to maximize independence and safety to allow discharge to AIR if pt has support of family at DC. Expect if mental status continues to improve that mobility will improve quickly.         Recommendations for follow up therapy are one component of a multi-disciplinary discharge planning process, led by the attending physician.  Recommendations may be updated based on patient status, additional functional criteria and insurance authorization.  Follow Up Recommendations Acute inpatient rehab (3hours/day) (if pt has family support available)    Assistance Recommended at Discharge Frequent or constant Supervision/Assistance  Patient can return home with the following  A lot of help with walking and/or transfers;Help with stairs or ramp for entrance;Assist for transportation;Direct supervision/assist for medications management;Direct supervision/assist for financial management;A lot of help with bathing/dressing/bathroom    Equipment Recommendations Other (comment) (To bedetermined)  Recommendations for Other Services       Functional Status Assessment Patient has had a recent decline in their functional status and demonstrates the ability to make significant improvements in function in a reasonable and predictable amount of time.     Precautions / Restrictions Precautions Precautions: Fall;Other (comment) Precaution Comments: worms in stool Restrictions Weight Bearing  Restrictions: No      Mobility  Bed Mobility Overal bed mobility: Needs Assistance Bed Mobility: Supine to Sit     Supine to sit: Total assist, HOB elevated     General bed mobility comments: Assist for all aspects due to lethargy    Transfers Overall transfer level: Needs assistance Equipment used: Ambulation equipment used Transfers: Sit to/from Stand, Bed to chair/wheelchair/BSC Sit to Stand: +2 physical assistance, Mod assist           General transfer comment: Assist to bring hips up and for balance. Assist placing hands on bar of Stedy. Used Stedy for bed to Management consultant: Stedy  Ambulation/Gait             Pre-gait activities: Stood with [employer] x60- 90 seconds x 3 with min assist to maintain    Stairs            Wheelchair Mobility    Modified Rankin (Stroke Patients Only)       Balance Overall balance assessment: Needs assistance Sitting-balance support: No upper extremity supported, Feet supported Sitting balance-Leahy Scale: Fair     Standing balance support: Bilateral upper extremity supported, During functional activity Standing balance-Leahy Scale: Poor Standing balance comment: Stedy and min assist for static standing                             Pertinent Vitals/Pain Pain Assessment Pain Assessment: Faces Faces Pain Scale: No hurt    Home Living Family/patient expects to be discharged to:: Private residence Living Arrangements: Alone Available Help at Discharge: Family Type of Home: House             Additional Comments: Limited home information due to cognition    Prior Function Prior Level of Function :  Patient poor historian/Family not available                     Hand Dominance   Dominant Hand: Right    Extremity/Trunk Assessment   Upper Extremity Assessment Upper Extremity Assessment: Defer to OT evaluation    Lower Extremity Assessment Lower Extremity Assessment:  Generalized weakness    Cervical / Trunk Assessment Cervical / Trunk Assessment: Normal  Communication   Communication: Expressive difficulties  Cognition Arousal/Alertness: Lethargic Behavior During Therapy: Flat affect Overall Cognitive Status: Impaired/Different from baseline Area of Impairment: Orientation, Attention, Following commands, Safety/judgement, Awareness, Problem solving                 Orientation Level: Disoriented to, Time, Situation Current Attention Level: Selective   Following Commands: Follows one step commands inconsistently, Follows one step commands with increased time Safety/Judgement: Decreased awareness of safety, Decreased awareness of deficits Awareness: Intellectual Problem Solving: Slow processing, Decreased initiation, Difficulty sequencing, Requires verbal cues, Requires tactile cues General Comments: Aroused with mobility but kept eyes shut throughout even when talkin        General Comments General comments (skin integrity, edema, etc.): VSS on RA    Exercises     Assessment/Plan    PT Assessment Patient needs continued PT services  PT Problem List Decreased strength;Decreased activity tolerance;Decreased balance;Decreased mobility;Decreased cognition       PT Treatment Interventions DME instruction;Gait training;Functional mobility training;Therapeutic activities;Therapeutic exercise;Balance training;Cognitive remediation;Patient/family education    PT Goals (Current goals can be found in the Care Plan section)  Acute Rehab PT Goals Patient Stated Goal: Unable to state PT Goal Formulation: Patient unable to participate in goal setting Time For Goal Achievement: 12/26/21 Potential to Achieve Goals: Good    Frequency Min 3X/week     Co-evaluation               AM-PAC PT "6 Clicks" Mobility  Outcome Measure Help needed turning from your back to your side while in a flat bed without using bedrails?: Total Help needed  moving from lying on your back to sitting on the side of a flat bed without using bedrails?: Total Help needed moving to and from a bed to a chair (including a wheelchair)?: Total Help needed standing up from a chair using your arms (e.g., wheelchair or bedside chair)?: Total Help needed to walk in hospital room?: Total Help needed climbing 3-5 steps with a railing? : Total 6 Click Score: 6    End of Session Equipment Utilized During Treatment: Gait belt Activity Tolerance: Patient limited by lethargy Patient left: in chair;with call bell/phone within reach;with chair alarm set Nurse Communication: Mobility status;Need for lift equipment PT Visit Diagnosis: Other abnormalities of gait and mobility (R26.89);Muscle weakness (generalized) (M62.81)    Time: 9604-5409 PT Time Calculation (min) (ACUTE ONLY): 31 min   Charges:   PT Evaluation $PT Eval Moderate Complexity: 1 Mod PT Treatments $Therapeutic Activity: 8-22 mins        [place] 12/12/2021, 5:06 PM

## 2021-12-12 NOTE — Progress Notes (Signed)
   Inpatient Rehab Admissions Coordinator :  Per therapy recommendations patient was screened for CIR candidacy by Malikye Reppond RN MSN. Patient is not yet at a level to tolerate the intensity required to pursue a CIR admit . Patient may have the potential to progress to become a candidate. The CIR admissions team will follow and monitor for progress and place a Rehab Consult order if felt to be appropriate. Please contact me with any questions.  Lamiyah Schlotter RN MSN Admissions Coordinator [phone]  

## 2021-12-12 NOTE — Assessment & Plan Note (Signed)
Platelets nadir 83K in setting of renal failure, pneumonia.  Now resolved.

## 2021-12-13 ENCOUNTER — Inpatient Hospital Stay (HOSPITAL_COMMUNITY): Payer: Medicare Other

## 2021-12-13 DIAGNOSIS — F419 Anxiety disorder, unspecified: Secondary | ICD-10-CM

## 2021-12-13 DIAGNOSIS — G8929 Other chronic pain: Secondary | ICD-10-CM

## 2021-12-13 LAB — CBC WITH DIFFERENTIAL/PLATELET
Abs Immature Granulocytes: 0.1 10*3/uL — ABNORMAL HIGH (ref 0.00–0.07)
Basophils Absolute: 0.1 10*3/uL (ref 0.0–0.1)
Basophils Relative: 1 %
Eosinophils Absolute: 0.2 10*3/uL (ref 0.0–0.5)
Eosinophils Relative: 1 %
HCT: 27.3 % — ABNORMAL LOW (ref 36.0–46.0)
Hemoglobin: 8.7 g/dL — ABNORMAL LOW (ref 12.0–15.0)
Immature Granulocytes: 1 %
Lymphocytes Relative: 9 %
Lymphs Abs: 1.2 10*3/uL (ref 0.7–4.0)
MCH: 29.3 pg (ref 26.0–34.0)
MCHC: 31.9 g/dL (ref 30.0–36.0)
MCV: 91.9 fL (ref 80.0–100.0)
Monocytes Absolute: 0.6 10*3/uL (ref 0.1–1.0)
Monocytes Relative: 5 %
Neutro Abs: 11.2 10*3/uL — ABNORMAL HIGH (ref 1.7–7.7)
Neutrophils Relative %: 83 %
Platelets: 293 10*3/uL (ref 150–400)
RBC: 2.97 MIL/uL — ABNORMAL LOW (ref 3.87–5.11)
RDW: 18.2 % — ABNORMAL HIGH (ref 11.5–15.5)
WBC: 13.3 10*3/uL — ABNORMAL HIGH (ref 4.0–10.5)
nRBC: 0.2 % (ref 0.0–0.2)

## 2021-12-13 LAB — RENAL FUNCTION PANEL
Albumin: 3.2 g/dL — ABNORMAL LOW (ref 3.5–5.0)
Anion gap: 11 (ref 5–15)
BUN: 25 mg/dL — ABNORMAL HIGH (ref 8–23)
CO2: 26 mmol/L (ref 22–32)
Calcium: 9.3 mg/dL (ref 8.9–10.3)
Chloride: 106 mmol/L (ref 98–111)
Creatinine, Ser: 1.72 mg/dL — ABNORMAL HIGH (ref 0.44–1.00)
GFR, Estimated: 32 mL/min — ABNORMAL LOW (ref >60)
Glucose, Bld: 135 mg/dL — ABNORMAL HIGH (ref 70–99)
Phosphorus: 4.3 mg/dL (ref 2.5–4.6)
Potassium: 4.1 mmol/L (ref 3.5–5.1)
Sodium: 143 mmol/L (ref 135–145)

## 2021-12-13 LAB — MAGNESIUM: Magnesium: 1.7 mg/dL (ref 1.7–2.4)

## 2021-12-13 LAB — GLUCOSE, CAPILLARY
Glucose-Capillary: 91 mg/dL (ref 70–99)
Glucose-Capillary: 104 mg/dL — ABNORMAL HIGH (ref 70–99)
Glucose-Capillary: 110 mg/dL — ABNORMAL HIGH (ref 70–99)
Glucose-Capillary: 166 mg/dL — ABNORMAL HIGH (ref 70–99)

## 2021-12-13 MED ORDER — ENOXAPARIN SODIUM 40 MG/0.4ML IJ SOSY
40.0000 mg | PREFILLED_SYRINGE | Freq: Every day | INTRAMUSCULAR | Status: DC
  Administered 2021-12-13: 40 mg via SUBCUTANEOUS
  Filled 2021-12-13: qty 0.4

## 2021-12-13 MED ORDER — METOPROLOL TARTRATE 50 MG PO TABS
50.0000 mg | ORAL_TABLET | Freq: Two times a day (BID) | ORAL | Status: DC
  Administered 2021-12-13 – 2021-12-14 (×2): 50 mg via ORAL
  Filled 2021-12-13 (×2): qty 1

## 2021-12-13 NOTE — Progress Notes (Signed)
PT Cancellation Note  Patient Details Name: Nolene Rocks MRN: [id number] DOB: [birth year]   Cancelled Treatment:    Reason Eval/Treat Not Completed: (P) Patient at procedure or test/unavailable Pt being prepped for overnight EEG. PT will follow back on Monday.   Charmagne Buhl B. Migdalia Dk PT, DPT Acute Rehabilitation Services Please use secure chat or  Call Office [phone]    [place] 12/13/2021, 11:50 AM

## 2021-12-13 NOTE — NC FL2 (Addendum)
[place] LEVEL OF CARE SCREENING TOOL     IDENTIFICATION  Patient Name: Shamaria Kavan Birthdate: May 27, 1956 Sex: female Admission Date (Current Location): 12/05/2021  [facility] and [place] Number:  Engineering geologist and Address:  The . [facility], [place] [address], [place], [place] [address]      Provider Number: [id number]  Attending Physician Name and Address:  Edwin Dada, *  Relative Name and Phone Number:       Current Level of Care: Hospital Recommended Level of Care: [place] Prior Approval Number:    Date Approved/Denied:   PASRR Number:  [id number]  Discharge Plan: SNF    Current Diagnoses: Patient Active Problem List   Diagnosis Date Noted   Hypokalemia 93/23/5573   Metabolic acidosis 22/08/5425   Worms in stool 12/12/2021   Hyponatremia 12/12/2021   Thrombocytopenia ([place]) 12/12/2021   Normocytic anemia 12/12/2021   Hypoglycemia    Aspiration pneumonia ([place]) 12/05/2021   Elevated sedimentation rate 12/05/2021   Status epilepticus ([place]) 12/05/2021   Delirium due to another medical condition 12/04/2021   Chronic pain 12/01/2021   Overweight (BMI 25.0-29.9) 11/30/2021   Acute renal failure (ARF) ([place]) 11/29/2021   Rhabdomyolysis 11/29/2021   Essential hypertension 11/29/2021   Dyslipidemia 11/29/2021   Anxiety 01/04/7627   Acute metabolic encephalopathy 31/51/7616   Labial cyst 11/07/2020   Midline cystocele 11/07/2020    Orientation RESPIRATION BLADDER Height & Weight     Self  Normal Incontinent Weight: 159 lb 13.3 oz (72.5 kg) Height:  '5\' 4"'$  (162.6 cm)  BEHAVIORAL SYMPTOMS/MOOD NEUROLOGICAL BOWEL NUTRITION STATUS    Convulsions/Seizures Incontinent Diet (regular)  AMBULATORY STATUS COMMUNICATION OF NEEDS Skin   Extensive Assist Verbally Skin abrasions (rash, groin)                       Personal Care Assistance Level of Assistance  Bathing, Feeding, Dressing Bathing  Assistance: Maximum assistance Feeding assistance: Limited assistance Dressing Assistance: Maximum assistance     Functional Limitations Info  Sight, Hearing Sight Info: Impaired Hearing Info: Impaired      SPECIAL CARE FACTORS FREQUENCY  PT (By licensed PT), OT (By licensed OT)     PT Frequency: 5x/wk OT Frequency: 5x/wk            Contractures Contractures Info: Not present    Additional Factors Info  Code Status, Allergies, Psychotropic Code Status Info: Full Allergies Info: Citrus, Latex, Other Psychotropic Info: Xanax 0.'5mg'$  3x/day         Current Medications (12/13/2021):  This is the current hospital active medication list Current Facility-Administered Medications  Medication Dose Route Frequency Provider Last Rate Last Admin   0.9 %  sodium chloride infusion   Intravenous PRN Icard, Bradley L, DO 10 mL/hr at 12/12/21 0226 New Bag at 12/12/21 0226   acetaminophen (TYLENOL) suppository 650 mg  650 mg Rectal Q4H PRN Kristopher Oppenheim, DO       ALPRAZolam Duanne Moron) tablet 0.5 mg  0.5 mg Oral TID Edwin Dada, MD   0.5 mg at 12/13/21 1046   amLODipine (NORVASC) tablet 10 mg  10 mg Oral Daily Edwin Dada, MD   10 mg at 12/13/21 1046   Darbepoetin Alfa (ARANESP) injection 100 mcg  100 mcg Subcutaneous Q Sat-1800 Elmarie Shiley, MD   100 mcg at 12/07/21 1813   enoxaparin (LOVENOX) injection 40 mg  40 mg Subcutaneous Daily Danford, Suann Larry, MD   40  mg at 12/13/21 1047   feeding supplement (ENSURE ENLIVE / ENSURE PLUS) liquid 237 mL  237 mL Oral TID BM Danford, Suann Larry, MD   237 mL at 12/13/21 1307   labetalol (NORMODYNE) injection 10 mg  10 mg Intravenous Q2H PRN Rehman, Areeg N, DO   10 mg at 12/07/21 2239   lacosamide (VIMPAT) tablet 100 mg  100 mg Oral BID Edwin Dada, MD   100 mg at 12/13/21 1046   levETIRAcetam (KEPPRA) tablet 500 mg  500 mg Oral BID Edwin Dada, MD   500 mg at 12/13/21 1046   MEDLINE mouth rinse  15 mL Mouth  Rinse BID Icard, Bradley L, DO   15 mL at 12/13/21 1047   metoprolol tartrate (LOPRESSOR) tablet 25 mg  25 mg Oral BID Edwin Dada, MD   25 mg at 12/13/21 1046   multivitamin with minerals tablet 1 tablet  1 tablet Oral Daily Danford, Suann Larry, MD   1 tablet at 12/13/21 1046   polyethylene glycol (MIRALAX / GLYCOLAX) packet 17 g  17 g Oral Daily Danford, Suann Larry, MD   17 g at 12/13/21 1046   senna-docusate (Senokot-S) tablet 1 tablet  1 tablet Oral BID Edwin Dada, MD   1 tablet at 12/13/21 1046   sodium chloride flush (NS) 0.9 % injection 10-40 mL  10-40 mL Intracatheter Q12H Nita Sells, MD   10 mL at 12/13/21 1047     Discharge Medications: Please see discharge summary for a list of discharge medications.  Relevant Imaging Results:  Relevant Lab Results:   Additional Information SS#: [id number]  Geralynn Ochs, LCSW

## 2021-12-13 NOTE — Progress Notes (Signed)
Physical Therapy Treatment Patient Details Name: Carla Gomez MRN: [id number] DOB: 03-Dec-1955 Today's Date: 12/13/2021   History of Present Illness 66 y.o. F admitted on 5/25 due to nonconvulsive status epilepticus and acute renal failure. PMH significant for chronic pain on chronic opiates, chronic benzodiazepine use, fibromyalgia, hyperlipidemia.    PT Comments    Session limited by presence of EEG wiring, however pt able to come to EoB with min A, and transfer to recliner with modA. Pt cognition with some improvement however unaware that bed is soaked in urine reporting "I have a catheter" and still requires maximal cuing for task completion. D/c plan remains appropriate at this time. PT will continue to follow acutely.   Recommendations for follow up therapy are one component of a multi-disciplinary discharge planning process, led by the attending physician.  Recommendations may be updated based on patient status, additional functional criteria and insurance authorization.  Follow Up Recommendations  Acute inpatient rehab (3hours/day) (if pt has family support available)     Assistance Recommended at Discharge Frequent or constant Supervision/Assistance  Patient can return home with the following A lot of help with walking and/or transfers;Help with stairs or ramp for entrance;Assist for transportation;Direct supervision/assist for medications management;Direct supervision/assist for financial management;A lot of help with bathing/dressing/bathroom   Equipment Recommendations  Other (comment) (To be determined)    Recommendations for Other Services       Precautions / Restrictions Precautions Precautions: Fall;Other (comment) Precaution Comments: worms in stool Restrictions Weight Bearing Restrictions: No     Mobility  Bed Mobility Overal bed mobility: Needs Assistance Bed Mobility: Supine to Sit     Supine to sit: HOB elevated, Min assist     General bed mobility  comments: min A for pulling up against therapist to bring LE to EoB, once LE over EoB increased effort to bring hips to EoB    Transfers Overall transfer level: Needs assistance Equipment used: Ambulation equipment used, 1 person hand held assist Transfers: Sit to/from Stand, Bed to chair/wheelchair/BSC Sit to Stand: Mod assist   Step pivot transfers: Mod assist       General transfer comment: modA for powerup, pt with increased bracing of posterior LE against EoB, mod physical assist to step to recliner, requiring increased assist for management of EEG wires           Balance Overall balance assessment: Needs assistance Sitting-balance support: No upper extremity supported, Feet supported Sitting balance-Leahy Scale: Fair     Standing balance support: Bilateral upper extremity supported, During functional activity Standing balance-Leahy Scale: Poor Standing balance comment: requires outside support to steady                            Cognition Arousal/Alertness: Awake/alert Behavior During Therapy: Flat affect Overall Cognitive Status: Impaired/Different from baseline Area of Impairment: Orientation, Attention, Following commands, Safety/judgement, Awareness, Problem solving                 Orientation Level: Disoriented to, Time, Situation Current Attention Level: Selective   Following Commands: Follows one step commands inconsistently, Follows one step commands with increased time, Follows multi-step commands inconsistently Safety/Judgement: Decreased awareness of safety, Decreased awareness of deficits Awareness: Emergent Problem Solving: Slow processing, Decreased initiation, Difficulty sequencing, Requires verbal cues, Requires tactile cues General Comments: pt requires redirection to task at hand and repetition due to decreased short term memory           General  Comments General comments (skin integrity, edema, etc.): Pt hooked up to  continuous EEG limiting mobilization      Pertinent Vitals/Pain Pain Assessment Pain Assessment: Faces Faces Pain Scale: No hurt    Home Living Family/patient expects to be discharged to:: Private residence Living Arrangements: Alone Available Help at Discharge: Family Type of Home: House             Additional Comments: Limited home information due to cognition        PT Goals (current goals can now be found in the care plan section) Acute Rehab PT Goals Patient Stated Goal: Unable to state PT Goal Formulation: Patient unable to participate in goal setting Time For Goal Achievement: 12/26/21 Potential to Achieve Goals: Good Progress towards PT goals: Progressing toward goals (limited by EEG equipment)    Frequency    Min 3X/week      PT Plan Current plan remains appropriate       AM-PAC PT "6 Clicks" Mobility   Outcome Measure  Help needed turning from your back to your side while in a flat bed without using bedrails?: A Little Help needed moving from lying on your back to sitting on the side of a flat bed without using bedrails?: A Little Help needed moving to and from a bed to a chair (including a wheelchair)?: A Lot Help needed standing up from a chair using your arms (e.g., wheelchair or bedside chair)?: A Lot Help needed to walk in hospital room?: Total Help needed climbing 3-5 steps with a railing? : Total 6 Click Score: 12    End of Session Equipment Utilized During Treatment: Gait belt Activity Tolerance: Other (comment) (limited by connection to continuous EEG) Patient left: in chair;with call bell/phone within reach;with chair alarm set Nurse Communication: Mobility status PT Visit Diagnosis: Other abnormalities of gait and mobility (R26.89);Muscle weakness (generalized) (M62.81)     Time: 0277-4128 PT Time Calculation (min) (ACUTE ONLY): 16 min  Charges:  $Therapeutic Activity: 8-22 mins                     Glenda Kunst B. Migdalia Dk PT,  DPT Acute Rehabilitation Services Please use secure chat or  Call Office [phone]    [place] 12/13/2021, 1:41 PM

## 2021-12-13 NOTE — Progress Notes (Signed)
Subjective: Per medicine team, patient has been having waxing and waning mental status.  Patient states she is usually "a sharp person" but thinks she is not as sharp right now.  Does not remember what led to the hospitalization.   ROS: negative except above  Examination  Vital signs in last 24 hours: Temp:  [98 F (36.7 C)-98.9 F (37.2 C)] 98 F (36.7 C) (06/02 0417) Pulse Rate:  [66-106] 104 (06/02 0417) Resp:  [0-21] 18 (06/02 0417) BP: (144-163)/(76-90) 162/90 (06/02 0417) SpO2:  [95 %-100 %] 100 % (06/02 0417)  General: lying in bed, NAD Neuro: Awake, alert, oriented to self, place but not to time, able to name objects, able to follow simple commands, able to do simple math like 5+ fours = 9, able to tell me days of the week backwards, cranial nerves II to XII appear grossly intact, 5-/5 in all 4 extremities  Basic Metabolic Panel: Recent Labs  Lab 12/08/21 1742 12/09/21 0026 12/09/21 0655 12/09/21 1434 12/10/21 0146 12/11/21 0221 12/11/21 0949 12/12/21 0137 12/13/21 0412  NA  --    < > 141  --  140 142 140 144 143  K  --    < > 3.1*  --  3.8 2.6* 2.9* 3.7 4.1  CL  --   --  106  --  105 105 104 109 106  CO2  --   --  26  --  '26 26 29 26 26  '$ GLUCOSE  --   --  152*  --  145* 133* 157* 160* 135*  BUN  --   --  35*  --  31* 27* 25* 27* 25*  CREATININE  --   --  2.89*  --  2.48* 2.14* 2.00* 1.81* 1.72*  CALCIUM  --   --  8.5*  --  8.8* 8.4* 8.2* 8.5* 9.3  MG 2.1  --  2.1 1.8  --  1.6*  --   --  1.7  PHOS 3.4  --  4.5 4.2 3.1 3.3  --  2.3* 4.3   < > = values in this interval not displayed.    CBC: Recent Labs  Lab 12/09/21 0924 12/10/21 0146 12/11/21 0221 12/12/21 0137 12/13/21 0412  WBC 10.8* 18.7* 16.0* 12.8* 13.3*  NEUTROABS 8.6* 16.2* 13.5* 10.6* 11.2*  HGB 7.6* 8.2* 8.2* 7.8* 8.7*  HCT 23.3* 24.6* 24.7* 24.5* 27.3*  MCV 87.6 87.2 88.2 90.7 91.9  PLT 79* 134* 156 202 293     Coagulation Studies: No results for input(s): LABPROT, INR in the last 72  hours.  Imaging No new brain imaging overnight  ASSESSMENT AND PLAN: 66 year old female who was brought in on 5/90/2023 via EMS due to altered mental status.     Acute encephalopathy, suspected toxic-metabolic, improving Seizures, resolved -Patient's encephalopathy was likely multifactorial due to uremia, hyponatremia, possible infection as well as seizures which is gradually improving . -The current waxing and waning mental status could be due to medications, intermittent seizures, delirium  Recommendations -We will obtain video EEG to look for intermittent seizures due to waxing and waning mental status -Continue Xanax 0.5 mg 3 times daily, Keppra 500 mg twice daily and Vimpat 50 mg twice daily. -If no seizures overnight, can stop Vimpat -PT/OT/Speech therapy -Continue seizure precautions, delirium precautions -Plan discussed with Dr. Loleta Books   I have spent a total of  36  minutes with the patient reviewing hospital notes,  test results, labs and examining the patient as well as establishing  an assessment and plan that was discussed personally with the patient.  > 50% of time was spent in direct patient care.    Zeb Comfort Epilepsy [facility] For questions after 5pm please refer to AMION to reach the Neurologist on call

## 2021-12-13 NOTE — Progress Notes (Signed)
LTM EEG hooked up and running - no initial skin breakdown - push button tested - neuro notified. [facility] monitoring.  

## 2021-12-13 NOTE — TOC Initial Note (Signed)
Transition of Care [facility]) - Initial/Assessment Note    Patient Details  Name: Carla Gomez MRN: [id number] Date of Birth: 08-21-1955  [facility]) CM/SW Contact:    Geralynn Ochs, LCSW Phone Number: 12/13/2021, 2:44 PM  Clinical Narrative:      CSW noting per chart review that patient unable to tolerate CIR at this time. CSW contacted patient's son, Ovid Curd, via phone to discuss SNF placement. Ovid Curd in agreement, would prefer placement close to [place]. CSW faxed out referral, will follow with bed offers.             Expected Discharge Plan: Skilled Nursing Facility Barriers to Discharge: Continued Medical Work up, Ship broker   Patient Goals and CMS Choice Patient states their goals for this hospitalization and ongoing recovery are:: patient unable to participate in goal setting, not fully oriented CMS Medicare.gov Compare Post Acute Care list provided to:: Patient Represenative (must comment) Choice offered to / list presented to : Adult Children  Expected Discharge Plan and Services Expected Discharge Plan: [place] Choice: [place] Living arrangements for the past 2 months: Apartment                                      Prior Living Arrangements/Services Living arrangements for the past 2 months: Apartment Lives with:: Self Patient language and need for interpreter reviewed:: No Do you feel safe going back to the place where you live?: Yes      Need for Family Participation in Patient Care: Yes (Comment) Care giver support system in place?: No (comment)   Criminal Activity/Legal Involvement Pertinent to Current Situation/Hospitalization: No - Comment as needed  Activities of Daily Living      Permission Sought/Granted Permission sought to share information with : Facility Sport and exercise psychologist, Family Supports Permission granted to share information with : Yes, Verbal  Permission Granted  Share Information with NAME: Ovid Curd  Permission granted to share info w AGENCY: SNF  Permission granted to share info w Relationship: Son     Emotional Assessment   Attitude/Demeanor/Rapport: Unable to Assess Affect (typically observed): Unable to Assess Orientation: : Oriented to Self Alcohol / Substance Use: Not Applicable Psych Involvement: No (comment)  Admission diagnosis:  Status epilepticus ([place]) [G40.901] Patient Active Problem List   Diagnosis Date Noted   Hypokalemia 83/41/9622   Metabolic acidosis 29/79/8921   Worms in stool 12/12/2021   Hyponatremia 12/12/2021   Thrombocytopenia ([place]) 12/12/2021   Normocytic anemia 12/12/2021   Hypoglycemia    Aspiration pneumonia ([place]) 12/05/2021   Elevated sedimentation rate 12/05/2021   Status epilepticus ([place]) 12/05/2021   Delirium due to another medical condition 12/04/2021   Chronic pain 12/01/2021   Overweight (BMI 25.0-29.9) 11/30/2021   Acute renal failure (ARF) ([place]) 11/29/2021   Rhabdomyolysis 11/29/2021   Essential hypertension 11/29/2021   Dyslipidemia 11/29/2021   Anxiety 19/41/7408   Acute metabolic encephalopathy 14/48/1856   Labial cyst 11/07/2020   Midline cystocele 11/07/2020   PCP:  Albina Billet, MD Pharmacy:   [facility] [address] [place] [address] Phone: [phone] Fax: [phone]  [id number] [place] [address] [place]: [phone]: [phone]    Social Determinants of Health (SDOH) Interventions  Readmission Risk Interventions     View : No data to display.

## 2021-12-13 NOTE — Progress Notes (Signed)
Occupational Therapy Treatment Patient Details Name: Carla Gomez MRN: [id number] DOB: [birth year] Today's Date: 12/13/2021   History of present illness 66 y.o. F admitted on 5/25 due to nonconvulsive status epilepticus and acute renal failure. PMH significant for chronic pain on chronic opiates, chronic benzodiazepine use, fibromyalgia, hyperlipidemia.   OT comments  Pt making incremental progress with OT goals this session. She was following approximately 50% of simple commands and had decreased safety and deficits awareness. Requiring overall min-mod A for bed mobility and sit<>stands, having difficulty maintaining balance. Throughout session she was having difficulty word finding, with slurred speech. Continuing to recommend AIR to maximize her independence. OT will follow acutely.    Recommendations for follow up therapy are one component of a multi-disciplinary discharge planning process, led by the attending physician.  Recommendations may be updated based on patient status, additional functional criteria and insurance authorization.    Follow Up Recommendations  Acute inpatient rehab (3hours/day)    Assistance Recommended at Discharge Frequent or constant Supervision/Assistance  Patient can return home with the following  Two people to help with walking and/or transfers;Two people to help with bathing/dressing/bathroom;Assistance with cooking/housework;Assistance with feeding;Direct supervision/assist for medications management;Direct supervision/assist for financial management;Assist for transportation;Help with stairs or ramp for entrance   Equipment Recommendations  BSC/3in1;Tub/shower seat;Other (comment)    Recommendations for Other Services      Precautions / Restrictions Precautions Precautions: Fall;Other (comment) Precaution Comments: worms in stool Restrictions Weight Bearing Restrictions: No       Mobility Bed Mobility Overal bed mobility: Needs  Assistance Bed Mobility: Supine to Sit     Supine to sit: HOB elevated, Min assist     General bed mobility comments: min A for pulling up against therapist to bring LE to EoB, once LE over EoB increased effort to bring hips to EoB    Transfers Overall transfer level: Needs assistance Equipment used: 1 person hand held assist Transfers: Sit to/from Stand Sit to Stand: Mod assist           General transfer comment: modA for powerup, pt with increased bracing of posterior LE against EoB,     Balance Overall balance assessment: Needs assistance Sitting-balance support: No upper extremity supported, Feet supported Sitting balance-Leahy Scale: Fair     Standing balance support: Bilateral upper extremity supported, During functional activity Standing balance-Leahy Scale: Poor Standing balance comment: requires outside support to steady                           ADL either performed or assessed with clinical judgement   ADL Overall ADL's : Needs assistance/impaired                         Toilet Transfer: Moderate assistance;Stand-pivot Toilet Transfer Details (indicate cue type and reason): Max multimodal cuing           General ADL Comments: Session limited due to decreased cognition this session. Requiring max multimodal cuing to sit EOB and stand.    Extremity/Trunk Assessment              Vision       Perception     Praxis      Cognition Arousal/Alertness: Awake/alert Behavior During Therapy: Flat affect Overall Cognitive Status: Impaired/Different from baseline Area of Impairment: Orientation, Attention, Following commands, Safety/judgement, Awareness, Problem solving  Orientation Level: Disoriented to, Time, Situation Current Attention Level: Selective   Following Commands: Follows one step commands inconsistently, Follows one step commands with increased time, Follows multi-step commands  inconsistently Safety/Judgement: Decreased awareness of safety, Decreased awareness of deficits Awareness: Emergent Problem Solving: Slow processing, Decreased initiation, Difficulty sequencing, Requires verbal cues, Requires tactile cues General Comments: pt requires redirection to task at hand and repetition due to decreased short term memory        Exercises      Shoulder Instructions       General Comments VSS on RA, pt on EEG    Pertinent Vitals/ Pain       Pain Assessment Pain Assessment: No/denies pain Faces Pain Scale: No hurt  Home Living                                          Prior Functioning/Environment              Frequency  Min 2X/week        Progress Toward Goals  OT Goals(current goals can now be found in the care plan section)  Progress towards OT goals: Progressing toward goals  Acute Rehab OT Goals Patient Stated Goal: None stated OT Goal Formulation: Patient unable to participate in goal setting Time For Goal Achievement: 12/25/21 Potential to Achieve Goals: Good ADL Goals Pt Will Perform Grooming: with set-up;sitting Pt Will Perform Lower Body Bathing: with min assist;sitting/lateral leans;sit to/from stand Pt Will Perform Lower Body Dressing: with min assist;sitting/lateral leans;sit to/from stand Pt Will Transfer to Toilet: with min assist;stand pivot transfer Pt Will Perform Toileting - Clothing Manipulation and hygiene: with min assist;sitting/lateral leans;sit to/from stand Additional ADL Goal #1: Pt will follow 1-2 step commands 100% of the session with no verbal cues. Additional ADL Goal #2: Pt will complete a medication management task with no errors.  Plan Discharge plan remains appropriate;Frequency remains appropriate    Co-evaluation                 AM-PAC OT "6 Clicks" Daily Activity     Outcome Measure   Help from another person eating meals?: A Lot Help from another person taking care of  personal grooming?: A Lot Help from another person toileting, which includes using toliet, bedpan, or urinal?: A Lot Help from another person bathing (including washing, rinsing, drying)?: A Lot Help from another person to put on and taking off regular upper body clothing?: A Lot Help from another person to put on and taking off regular lower body clothing?: A Lot 6 Click Score: 12    End of Session Equipment Utilized During Treatment: Gait belt  OT Visit Diagnosis: Unsteadiness on feet (R26.81);Other abnormalities of gait and mobility (R26.89);Muscle weakness (generalized) (M62.81);Other symptoms and signs involving the nervous system (R29.898)   Activity Tolerance Patient tolerated treatment well   Patient Left in bed;with call bell/phone within reach;with bed alarm set   Nurse Communication Mobility status        Time: 0998-3382 OT Time Calculation (min): 17 min  Charges: OT General Charges $OT Visit: 1 Visit OT Treatments $Therapeutic Activity: 8-22 mins  Nahmir Zeidman H., OTR/L Acute Rehabilitation  Roe Koffman Elane Yolanda Bonine 12/13/2021, 6:42 PM

## 2021-12-13 NOTE — Progress Notes (Signed)
Progress Note   Patient: Carla Gomez [id number] DOB: [birth year] DOA: 12/05/2021     8 DOS: the patient was seen and examined on 12/13/2021 at 10:05AM      Brief hospital course: Mrs. Johannsen is a 66 y.o. F with HTN and the migraine-fibromyalgia chronic pain syndrome who presented with confusion, found to have renal failure.  Evidently, neighbors heard her hollering, called EMS.  In the ER at [facility], Cr was 16.28 and she had BUN 123 and acidosis.     5/19: Admitted to [facility], started on fluids, renal US with medical disease 5/20: Nephrology consulted, minimal UOP, HD cath placed, underwent dialysis on 5/20  5/21: Maybe improved mentation 5/22: HD repeated 5/23: Psychiatry consulted 5/25: 3rd HD session, EEG obtained due to persistent poor mentation and showed continuous sharp wave discharges concerning for nonconvulsive status epilepticus, transferred to [facility] 5/26: Worsening agitation, transferred to ICU 5/28: LP performed 5/29: MRI brain with chronic small vessel disease only 5/30: ID consulted due to "worms" being observed in stool 5/31: Mentation improved, transferred OOU      Assessment and Plan: * Acute metabolic encephalopathy Due to renal failure, in setting of high dose oral morphine use.  Probably Xanax, Flexeril+TCAs also contributing.  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  Seizures ceased on LTM EEG.   In the last 48 hours she contineus to have waxing and waning alertness, despite >2 weeks off PTA meds.   Discussed with neurology, this is likely residuals from SE, less likely Keppra/Vimpat related, less likely recurrent nonconvulsive seizures.  MRI brain 3 days ago normal.  - Continue Xanax, dose reduced.  May need to reduce further, this is not safe long term for this patient - Continue Vimpat and Keppra per Neurology - Resume LTM EEG  Delirium precautions:   -Lights and TV off, minimize interruptions at night  -Blinds open and lights on  during day  -Glasses/hearing aid with patient  -Frequent reorientation  -PT/OT when able  -Avoid sedation medications/Beers list medications      Status epilepticus ([place]) - Consult Neurology - Continue Vimpat and Keppra per Neurology   Normocytic anemia Hgb trending down.  No iron deficiency  Thrombocytopenia (HCC) Platelets nadir 83K in setting of renal failure, pneumonia.  Now resolved.  Hyponatremia Resolved  Worms in stool, ruled out O+P negative.  This was a single unverified occurrence, likely mistaken observation.  Metabolic acidosis Resolved  Hypokalemia Supplemented and resovled  Aspiration pneumonia ([place]) Sepsis ruled out. Pneumonia treated and resolved.  Chronic pain - Hold Norco - Hold Morphine   Overweight (BMI 25.0-29.9) Chronic.  Anxiety Only on Xanax as an outpatient, no maintenance medication I can see. - Continue Xanax, space out, not safe long term  Dyslipidemia Chronic.  Holding statin due to recent rhabdomyolysis.  Essential hypertension BP still elevated - Continue amlodipine - Continue  Metoprolol, increase dose  Rhabdomyolysis Improved from her original admission at [facility].  Continue to hold statin. Check CK in AM.  Acute renal failure (ARF) (HCC) Creatinine >16 on admission, CKD IV ruled out, she probably has chronic kidney disease but the stage couldn't be clinically determined at this point.  Continues to improve, UOP good.          Subjective: Still somewhat confused today, no headache, chest pain, dyspnea, fever, seizures.     Physical Exam: Vitals:   12/13/21 0026 12/13/21 0417 12/13/21 1227 12/13/21 1634  BP: (!) 148/76 (!) 162/90 (!) 164/97 (!) 145/71  Pulse:  89 (!) 104 93 96  Resp: (!) '21 18 16 16  '$ Temp: 98.6 F (37 C) 98 F (36.7 C) 98.9 F (37.2 C) 99.4 F (37.4 C)  TempSrc: Oral Oral Oral Oral  SpO2: 98% 100% 99% 97%  Weight:      Height:       Elderly adult female, sitting in recliner,  restless and somewhat disoriented RRR, no murmurs, no peripheral edema Respiratory rate normal, lungs clear without rales or wheezes Abdomen soft without tenderness palpation or guarding Skin without suspicious rashes or lesions Attention diminished, affect blunted, she is inattentive to our conversation, and has some psychomotor slowing, and word finding difficulties, she sometimes follows commands, other times she seems unable to process when answering  Data Reviewed: Discussed with neurology, nursing notes reviewed, vital signs reviewed Patient metabolic panel shows improving creatinine, potassium and sodium normal Blood cell count shows minor increase from 12-13 Hemoglobin 8.7  Family Communication: Attempted call to mother twice.  No answer, left voicemail.  The patient has been clear that she would like me to call her mother for updates.    Disposition: Status is: Inpatient The patient was admitted for renal failure and confusion.  Renal failure has resolved, but she remains somewhat encephalopathic, and I think encephalopathic at this point to engage in rehab therapy.  Neurology and I are engaged to see if this is a reversible cause of just residual delirium.        Author: Edwin Dada, MD 12/13/2021 5:38 PM  For on call review [email or web address].

## 2021-12-14 DIAGNOSIS — R4189 Other symptoms and signs involving cognitive functions and awareness: Secondary | ICD-10-CM

## 2021-12-14 LAB — RENAL FUNCTION PANEL
Albumin: 3.4 g/dL — ABNORMAL LOW (ref 3.5–5.0)
Anion gap: 10 (ref 5–15)
BUN: 31 mg/dL — ABNORMAL HIGH (ref 8–23)
CO2: 27 mmol/L (ref 22–32)
Calcium: 9.4 mg/dL (ref 8.9–10.3)
Chloride: 106 mmol/L (ref 98–111)
Creatinine, Ser: 1.83 mg/dL — ABNORMAL HIGH (ref 0.44–1.00)
GFR, Estimated: 30 mL/min — ABNORMAL LOW (ref >60)
Glucose, Bld: 110 mg/dL — ABNORMAL HIGH (ref 70–99)
Phosphorus: 4.4 mg/dL (ref 2.5–4.6)
Potassium: 4 mmol/L (ref 3.5–5.1)
Sodium: 143 mmol/L (ref 135–145)

## 2021-12-14 LAB — GLUCOSE, CAPILLARY
Glucose-Capillary: 106 mg/dL — ABNORMAL HIGH (ref 70–99)
Glucose-Capillary: 110 mg/dL — ABNORMAL HIGH (ref 70–99)
Glucose-Capillary: 116 mg/dL — ABNORMAL HIGH (ref 70–99)

## 2021-12-14 MED ORDER — ENOXAPARIN SODIUM 30 MG/0.3ML IJ SOSY
30.0000 mg | PREFILLED_SYRINGE | Freq: Every day | INTRAMUSCULAR | Status: DC
  Administered 2021-12-14 – 2021-12-18 (×5): 30 mg via SUBCUTANEOUS
  Filled 2021-12-14 (×5): qty 0.3

## 2021-12-14 MED ORDER — CARVEDILOL 6.25 MG PO TABS
6.2500 mg | ORAL_TABLET | Freq: Two times a day (BID) | ORAL | Status: DC
  Administered 2021-12-14 – 2021-12-18 (×8): 6.25 mg via ORAL
  Filled 2021-12-14 (×9): qty 1

## 2021-12-14 NOTE — Progress Notes (Signed)
Subjective: no seizures overnight. LTM neg. Will stop vimpat today. She has no complaints but is still slow to respond to questions and gets confused easily.   ROS: negative except above  Examination  Vital signs in last 24 hours: Temp:  [98.2 F (36.8 C)-99.4 F (37.4 C)] 98.7 F (37.1 C) (06/03 0832) Pulse Rate:  [84-110] 110 (06/03 0832) Resp:  [16-17] 17 (06/03 0832) BP: (135-164)/(71-102) 142/76 (06/03 0832) SpO2:  [97 %-100 %] 100 % (06/03 0832)  General: lying in bed, NAD Neuro: Awake, alert, oriented to self, place. She knew the year, month and president. No aphasia.  CN: PERRL, EOMI. Face symmetric. Motor: non focal. Sensory: normal LT in all 4 ext.   Basic Metabolic Panel: Recent Labs  Lab 12/08/21 1742 12/09/21 0026 12/09/21 5038 12/09/21 1434 12/10/21 0146 12/11/21 0221 12/11/21 0949 12/12/21 0137 12/13/21 0412 12/14/21 0022  NA  --    < > 141  --  140 142 140 144 143 143  K  --    < > 3.1*  --  3.8 2.6* 2.9* 3.7 4.1 4.0  CL  --   --  106  --  105 105 104 109 106 106  CO2  --   --  26  --  '26 26 29 26 26 27  '$ GLUCOSE  --   --  152*  --  145* 133* 157* 160* 135* 110*  BUN  --   --  35*  --  31* 27* 25* 27* 25* 31*  CREATININE  --   --  2.89*  --  2.48* 2.14* 2.00* 1.81* 1.72* 1.83*  CALCIUM  --   --  8.5*  --  8.8* 8.4* 8.2* 8.5* 9.3 9.4  MG 2.1  --  2.1 1.8  --  1.6*  --   --  1.7  --   PHOS 3.4  --  4.5 4.2 3.1 3.3  --  2.3* 4.3 4.4   < > = values in this interval not displayed.     CBC: Recent Labs  Lab 12/09/21 0924 12/10/21 0146 12/11/21 0221 12/12/21 0137 12/13/21 0412  WBC 10.8* 18.7* 16.0* 12.8* 13.3*  NEUTROABS 8.6* 16.2* 13.5* 10.6* 11.2*  HGB 7.6* 8.2* 8.2* 7.8* 8.7*  HCT 23.3* 24.6* 24.7* 24.5* 27.3*  MCV 87.6 87.2 88.2 90.7 91.9  PLT 79* 134* 156 202 293      Coagulation Studies: No results for input(s): LABPROT, INR in the last 72 hours.  Imaging 5/19: CT head neg. 5/29: MRI brain: WML's 6/3: LTM no seizures overnight.    ASSESSMENT AND PLAN: 66 year old female who was brought in on 5/90/2023 via EMS due to altered mental status.     Acute encephalopathy, suspected toxic-metabolic, improving Seizures, resolved -Patient's encephalopathy was likely multifactorial due to uremia, hyponatremia, possible infection as well as seizures which is gradually improving . -The current waxing and waning mental status could be due to medications, intermittent seizures, delirium  Recommendations -stop LTM.  -Continue Xanax 0.5 mg 3 times daily, Keppra 500 mg twice daily -stop Vimpat -Continue seizure precautions, delirium precautions -Plan discussed with Dr. Loleta Books   Total of 35 mins spent reviewing chart, discussion with patient and Dr. Loleta Books about Dx and plan. Reviewed Imaging personally.

## 2021-12-14 NOTE — Progress Notes (Signed)
Inpatient Rehab Admissions:  Inpatient Rehab Consult received.  I met with patient at the bedside for rehabilitation assessment and to discuss goals and expectations of an inpatient rehab admission.  Explained CIR goals and expectations. Pt acknowledged understanding. Pt gave permission to contact children. Spoke with Jola Schmidt, pt's daughter. She informed AC that 24/7 support after discharge is not feasible for family. She would like to discuss other therapy options with TOC. TOC made aware. AC will sign off.  Signed: Gayland Curry, [place], [place] Admissions Coordinator [phone]

## 2021-12-14 NOTE — Procedures (Addendum)
Patient Name: Carla Gomez  MRN: [id number]  Epilepsy Attending: Lora Havens  Referring Physician/Provider: Lorenza Chick, MD Duration: 12/13/2021 1138 to 12/14/2021  0741   Patient history: 66 year old woman with past medical history significant for chronic pain on chronic opiates, chronic benzodiazepine use, fibromyalgia, hyperlipidemia.  She has presented with acute renal failure of unknown etiology now with nonconvulsive status epilepticus.  EEG to evaluate for seizure.   Level of alertness: awake, asleep    AEDs during EEG study: Keppra, Vimpat, Xanax   Technical aspects: This EEG study was done with scalp electrodes positioned according to the 10-20 International system of electrode placement. Electrical activity was acquired at a sampling rate of '500Hz'$  and reviewed with a high frequency filter of '70Hz'$  and a low frequency filter of '1Hz'$ . EEG data were recorded continuously and digitally stored.    Description: The posterior dominant rhythm consists of 7.5 Hz activity of moderate voltage (25-35 uV) seen predominantly in posterior head regions, symmetric and reactive to eye opening and eye closing. Sleep was characterized by vertex waves, sleep spindles (12 to 14 Hz), maximal frontocentral region. EEG showed Intermittent generalized 3-'6Hz'$  theta- delta slowing. Hyperventilation and photic stimulation were not performed.       ABNORMALITY - Intermittent slow, generalized   IMPRESSION: This study is suggestive of mild to moderate diffuse encephalopathy, nonspecific to etiology. No seizures or epileptiform discharges were seen throughout the recording.   Cheris Tweten Barbra Sarks

## 2021-12-14 NOTE — Progress Notes (Signed)
LTM EEG discontinued - no skin breakdown at unhook.   

## 2021-12-14 NOTE — TOC Progression Note (Addendum)
Transition of Care [facility]) - Progression Note    Patient Details  Name: Carla Gomez MRN: [id number] Date of Birth: 07/29/1955  Transition of Care [facility]) CM/SW Ray, LCSW Phone Number: 12/14/2021, 9:31 AM  Clinical Narrative:    9:31am-CSW contacted patient's son Ovid Curd to go over SNF bed offers; voicemail full. CSW sent HIPPA compliant text requesting call back.   1:28pm-Still no return call from patient's son. CSW spoke with patient's daughter who reports that she believed she was the primary family contact because she is coordinating with patient's mother, son, and ex-husband. CSW provided SNF bed offers for family to review.    Expected Discharge Plan: [place] Barriers to Discharge: Continued Medical Work up, Ship broker  Expected Discharge Plan and Services Expected Discharge Plan: [place] Choice: [place] arrangements for the past 2 months: Apartment                                       Social Determinants of Health (SDOH) Interventions    Readmission Risk Interventions     View : No data to display.

## 2021-12-14 NOTE — Assessment & Plan Note (Addendum)
Daughter provides collateral information that family had had concerns about cognitive impairment for many months prior to this illness (that she would be slightly incoherent or say slightly confused things on the phone at times though seemed to be managing her affairs).  This is likely the fefect of her benzodiazepines and opiates, but may have also reflected some pre-existing underlying dementia. - Recommend Neuropsychiatric testing after she has had time to rehabilitate

## 2021-12-14 NOTE — Progress Notes (Signed)
Progress Note   Patient: Carla Gomez [id number] DOB: [birth year] DOA: 12/05/2021     9 DOS: the patient was seen and examined on 12/14/2021 at 9:45AM      Brief hospital course: Carla Gomez is a 66 y.o. F with HTN and the migraine-fibromyalgia chronic pain syndrome who presented with confusion, found to have renal failure.  Evidently, neighbors heard her hollering, called EMS.  In the ER at [facility], Cr was 16.28 and she had BUN 123 and acidosis.     5/19: Admitted to [facility], started on fluids, renal US with medical disease 5/20: Nephrology consulted, minimal UOP, HD cath placed, underwent dialysis on 5/20  5/21: Maybe improved mentation 5/22: HD repeated 5/23: Psychiatry consulted 5/25: 3rd HD session, EEG obtained due to persistent poor mentation and showed continuous sharp wave discharges concerning for nonconvulsive status epilepticus, transferred to [facility] 5/26: Worsening agitation, transferred to ICU 5/28: LP performed 5/29: MRI brain with chronic small vessel disease only 5/30: ID consulted due to "worms" being observed in stool 5/31: Mentation improved, transferred OOU      Assessment and Plan: * Acute metabolic encephalopathy Slowly improving.  Repeat 24 hr EEG normal. - Reduce Xanax - Continue Keppra - Vimpat stopped Delirium precautions:   -Lights and TV off, minimize interruptions at night  -Blinds open and lights on during day  -Glasses/hearing aid with patient  -Frequent reorientation  -PT/OT when able  -Avoid sedation medications/Beers list medications       Status epilepticus ([place]) LTM EEG repeated, no seizures - Consult Neurology - Continue Keppra   Chronic pain - Hold Norco - Hold Morphine   Essential hypertension BP high - Continue amlodipine - Hold metoprolol - Start Coreg   Acute renal failure (ARF) (HCC) Creatinine >16 on admission, CKD IV ruled out, she probably has chronic kidney disease but the stage couldn't be clinically  determined at this point.  Continues to improve, UOP good.   Stable today, looks like it is stabilizing at 1.8 mg/dL         Subjective: Improving and still with halting speech and word finding difficulties but more alert, following commands fairly well.     Physical Exam: Vitals:   12/13/21 2331 12/14/21 0832 12/14/21 1247 12/14/21 1608  BP: (!) 147/71 (!) 142/76 139/76 (!) 148/83  Pulse: 84 (!) 110 88 99  Resp: '16 17 19 20  '$ Temp: 98.6 F (37 C) 98.7 F (37.1 C) 98.4 F (36.9 C) 98.3 F (36.8 C)  TempSrc: Oral Oral Oral Oral  SpO2: 100% 100% 96% 99%  Weight:      Height:       Adult female, lying in bed, no acute distress, eating breakfast RRR no murmurs, no LE edmea Respiratory effort and rate normal, lungs clear without rales or wheezes.   Skin normal Attention normal, affect blunted, attends to conversation but fairly marked word finding difficulties and cognitive slowing.  Still she seems unable to process when answering  Data Reviewed: Discussed with neurology, nursing notes reviewed, vital signs reviewed Cr stable no change, K normal.  Hgb stable   Family Communication: Spoke with Carla Gomez and Carla Gomez by phone.  Carla Gomez and Carla Gomez report that Carla Gomez is best contact.  She is not official POA but makes decisions with input from brother, which is their preference    Disposition: Status is: Inpatient The patient was admitted for renal failure and confusion.  Renal failure has resolved.   Her encephalopathy is slowly improving, and I think she  is close to being ffully able to participate in rehab         Author: Edwin Dada, MD 12/14/2021 4:29 PM  For on call review [email or web address].

## 2021-12-15 DIAGNOSIS — R4189 Other symptoms and signs involving cognitive functions and awareness: Secondary | ICD-10-CM

## 2021-12-15 LAB — GLUCOSE, CAPILLARY: Glucose-Capillary: 117 mg/dL — ABNORMAL HIGH (ref 70–99)

## 2021-12-15 MED ORDER — CLONAZEPAM 0.5 MG PO TABS
0.5000 mg | ORAL_TABLET | Freq: Two times a day (BID) | ORAL | Status: DC
  Administered 2021-12-15 – 2021-12-18 (×6): 0.5 mg via ORAL
  Filled 2021-12-15 (×6): qty 1

## 2021-12-15 NOTE — Progress Notes (Signed)
  Progress Note   Patient: Carla Gomez [id number] DOB: 10/25/1955 DOA: 12/05/2021     10 DOS: the patient was seen and examined on 12/15/2021 at 9:45AM      Brief hospital course: See summary from 6/3      Assessment and Plan: * Acute metabolic encephalopathy -Transition Xanax to clonazepam - Continue Keppra        Status epilepticus ([place]) -Continue Keppra   Chronic pain - Hold Norco - Hold Morphine   Essential hypertension Blood pressure still somewhat high  - Continue amlodipine and new Coreg   Acute renal failure (ARF) (HCC) -Repeat BMP tomorrow        Subjective: No new complaints, she does not much appetite and she is still somewhat confused     Physical Exam: Vitals:   12/14/21 1247 12/14/21 1608 12/14/21 2027 12/15/21 0302  BP: 139/76 (!) 148/83 135/64 (!) 158/92  Pulse: 88 99 83 96  Resp: '19 20 20 18  '$ Temp: 98.4 F (36.9 C) 98.3 F (36.8 C) 98.7 F (37.1 C) 98.6 F (37 C)  TempSrc: Oral Oral Oral Oral  SpO2: 96% 99% 95% 100%  Weight:      Height:       Adult female, lying in bed, appears disheveled and tired, has mild tremor RRR, no murmurs, no peripheral edema Respiratory effort normal, lungs clear without rales or wheezes She is oriented to self, "hospital", "[place]", and "[place]", but not state or city, she is oriented to month, year, and president.  She has word finding difficulties, and some marked cognitive slowing, and some difficulty with processing.       Data Reviewed: Nursing notes reviewed  Family Communication: We will call the daughter later today    Disposition: Status is: Inpatient The patient was admitted for renal failure and confusion.  Renal failure has resolved.   Her encephalopathy is slowly improving, and I think she is close to being ffully able to participate in rehab         Author: Edwin Dada, MD 12/15/2021 2:06 PM  For on call review [email or web address].

## 2021-12-16 DIAGNOSIS — G929 Unspecified toxic encephalopathy: Secondary | ICD-10-CM

## 2021-12-16 LAB — BASIC METABOLIC PANEL
Anion gap: 13 (ref 5–15)
BUN: 38 mg/dL — ABNORMAL HIGH (ref 8–23)
CO2: 24 mmol/L (ref 22–32)
Calcium: 9.7 mg/dL (ref 8.9–10.3)
Chloride: 106 mmol/L (ref 98–111)
Creatinine, Ser: 1.74 mg/dL — ABNORMAL HIGH (ref 0.44–1.00)
GFR, Estimated: 32 mL/min — ABNORMAL LOW (ref >60)
Glucose, Bld: 112 mg/dL — ABNORMAL HIGH (ref 70–99)
Potassium: 2.9 mmol/L — ABNORMAL LOW (ref 3.5–5.1)
Sodium: 143 mmol/L (ref 135–145)

## 2021-12-16 MED ORDER — LEVETIRACETAM 250 MG PO TABS
250.0000 mg | ORAL_TABLET | Freq: Two times a day (BID) | ORAL | Status: DC
  Administered 2021-12-16 – 2021-12-18 (×5): 250 mg via ORAL
  Filled 2021-12-16 (×5): qty 1

## 2021-12-16 MED ORDER — POTASSIUM CHLORIDE 20 MEQ PO PACK
40.0000 meq | PACK | Freq: Two times a day (BID) | ORAL | Status: AC
Start: 2021-12-16 — End: 2021-12-16
  Administered 2021-12-16 (×2): 40 meq via ORAL
  Filled 2021-12-16 (×2): qty 2

## 2021-12-16 NOTE — Progress Notes (Addendum)
Physical Therapy Treatment Patient Details Name: Carla Gomez MRN: [id number] DOB: 18-Nov-1955 Today's Date: 12/16/2021   History of Present Illness 66 y.o. F admitted on 5/25 due to nonconvulsive status epilepticus and acute renal failure. PMH significant for chronic pain on chronic opiates, chronic benzodiazepine use, fibromyalgia, hyperlipidemia.    PT Comments    Pt much more alert compared to last week. Pt able to mobilize and being ambulation with therapy today. Pt oriented however very confused with very impaired cognition. Pt with word finding difficulties, impaired sequencing, processing, and perseveration on an electric pillow that does not exist in her room. Recommend a SLP - cognitive evaluation. Pt was indep and living alone PTA. Pt to benefit from SNF upon d/c to allow for increased time to achieve indep function. Acute PT to cont to follow.    Recommendations for follow up therapy are one component of a multi-disciplinary discharge planning process, led by the attending physician.  Recommendations may be updated based on patient status, additional functional criteria and insurance authorization.  Follow Up Recommendations  Skilled nursing-short term rehab (<3 hours/day)     Assistance Recommended at Discharge Intermittent Supervision/Assistance  Patient can return home with the following A lot of help with walking and/or transfers;A lot of help with bathing/dressing/bathroom;Direct supervision/assist for medications management;Direct supervision/assist for financial management;Assist for transportation;Help with stairs or ramp for entrance   Equipment Recommendations   (TBD)    Recommendations for Other Services       Precautions / Restrictions Precautions Precautions: Fall;Other (comment) Precaution Comments: worms in stool Restrictions Weight Bearing Restrictions: No     Mobility  Bed Mobility               General bed mobility comments: pt received  sitting EOB with RN tech with request to use the bathroom    Transfers Overall transfer level: Needs assistance Equipment used: 1 person hand held assist Transfers: Sit to/from Stand, Bed to chair/wheelchair/BSC Sit to Stand: Min assist   Step pivot transfers: Mod assist       General transfer comment: minA to power up, pt unsteady and shaky, modA for step pvt transfer to [place] [employer], pt requiring max verbal step by step commands to complete transfer, pt provided R HHA    Ambulation/Gait Ambulation/Gait assistance: Min assist, Mod assist, +2 safety/equipment Gait Distance (Feet): 25 Feet Assistive device: Rolling walker (2 wheels), 1 person hand held assist Gait Pattern/deviations: Step-through pattern, Decreased stride length, Shuffle, Wide base of support Gait velocity: slow Gait velocity interpretation: <1.8 ft/sec, indicate of risk for recurrent falls   General Gait Details: pt easily distracted perseverating on electric pillow, being out of it last week, and "feeling so weird". Attempted to use RW however pt difficulty processing verbal cues to use it properly and was distracted by the walker "not moving" she wouldn't amb. Pt then given R HHA, pt able to amb however unsteady and reports dizziness. Pt with wide base of support, short step length near shuffling, truncal weakness   Stairs             Wheelchair Mobility    Modified Rankin (Stroke Patients Only)       Balance Overall balance assessment: Needs assistance Sitting-balance support: Feet supported, No upper extremity supported Sitting balance-Leahy Scale: Fair Sitting balance - Comments: pt required unilateral support for dynamic stability   Standing balance support: During functional activity, Single extremity supported Standing balance-Leahy Scale: Fair Standing balance comment: pt worked on [employer] in standing at  BSC, min/modA required to complete task and provide assist for balance. Pt took maxi pad and  tried wiping her backside despite having a wash cloth available to her, minA to aide in pulling up underwear                            Cognition Arousal/Alertness: Awake/alert Behavior During Therapy: WFL for tasks assessed/performed Overall Cognitive Status: Impaired/Different from baseline Area of Impairment: Orientation, Attention, Memory, Following commands, Safety/judgement, Awareness, Problem solving                 Orientation Level: Disoriented to, Situation (pt states "I have no idea what happened. I was out of it last week") Current Attention Level: Sustained Memory: Decreased short-term memory Following Commands: Follows one step commands with increased time Safety/Judgement: Decreased awareness of safety, Decreased awareness of deficits Awareness: Emergent Problem Solving: Slow processing, Decreased initiation, Difficulty sequencing, Requires verbal cues, Requires tactile cues General Comments: pt much more alert than last week, pt oriented however confused. Pt perseverating on one of the pillows being electric. Pt with difficulty processing task asked, delayed response time, impaired sequencing ability, pt with word finding difficulty, easily distracted        Exercises      General Comments General comments (skin integrity, edema, etc.): VSS on RA      Pertinent Vitals/Pain Pain Assessment Pain Assessment: No/denies pain    Home Living                          Prior Function            PT Goals (current goals can now be found in the care plan section) Progress towards PT goals: Progressing toward goals    Frequency    Min 3X/week      PT Plan Discharge plan needs to be updated    Co-evaluation              AM-PAC PT "6 Clicks" Mobility   Outcome Measure  Help needed turning from your back to your side while in a flat bed without using bedrails?: A Little Help needed moving from lying on your back to sitting on  the side of a flat bed without using bedrails?: A Little Help needed moving to and from a bed to a chair (including a wheelchair)?: A Lot Help needed standing up from a chair using your arms (e.g., wheelchair or bedside chair)?: A Little Help needed to walk in hospital room?: A Lot Help needed climbing 3-5 steps with a railing? : A Lot 6 Click Score: 15    End of Session Equipment Utilized During Treatment: Gait belt Activity Tolerance: Patient tolerated treatment well Patient left: in chair;with call bell/phone within reach;with chair alarm set Nurse Communication: Mobility status PT Visit Diagnosis: Difficulty in walking, not elsewhere classified (R26.2)     Time: 5465-0354 PT Time Calculation (min) (ACUTE ONLY): 21 min  Charges:  $Gait Training: 8-22 mins                     Carla Gomez, PT, DPT Acute Rehabilitation Services Secure chat preferred Office #: [phone]    Carla Gomez 12/16/2021, 12:27 PM

## 2021-12-16 NOTE — Care Management Important Message (Signed)
Important Message  Patient Details  Name: Carla Gomez MRN: [id number] Date of Birth: July 31, 1955   Medicare Important Message Given:  Yes     Orbie Pyo 12/16/2021, 3:16 PM

## 2021-12-16 NOTE — Progress Notes (Signed)
Occupational Therapy Treatment Patient Details Name: Carla Gomez MRN: [id number] DOB: [birth year] Today's Date: 12/16/2021   History of present illness 66 y.o. F admitted on 5/25 due to nonconvulsive status epilepticus and acute renal failure. MRI neg for acute changes. PMH significant for chronic pain on chronic opiates, chronic benzodiazepine use, fibromyalgia, hyperlipidemia.   OT comments  Pt progressing towards established OT goals and demonstrating increased arousal, awareness,  and activity tolerance. However, continues to present with decreased cognition, safety, and balance impacting her functional performance. Pt performing toileting with Supervision-Min A. Pt performing grooming at sink with Min A for balance. Continue to recommend dc to [facility] for intensive OT and will continue to follow acutely as admitted.    Recommendations for follow up therapy are one component of a multi-disciplinary discharge planning process, led by the attending physician.  Recommendations may be updated based on patient status, additional functional criteria and insurance authorization.    Follow Up Recommendations  Acute inpatient rehab (3hours/day)    Assistance Recommended at Discharge Frequent or constant Supervision/Assistance  Patient can return home with the following  Assistance with cooking/housework;Assistance with feeding;Direct supervision/assist for medications management;Direct supervision/assist for financial management;Assist for transportation;Help with stairs or ramp for entrance;A lot of help with walking and/or transfers;A lot of help with bathing/dressing/bathroom   Equipment Recommendations  BSC/3in1;Tub/shower seat;Other (comment) (RW)    Recommendations for Other Services      Precautions / Restrictions Precautions Precautions: Fall;Other (comment) Precaution Comments: worms in stool Restrictions Weight Bearing Restrictions: No       Mobility Bed Mobility                General bed mobility comments: Pt in recliner upon arrival and return at end of session    Transfers Overall transfer level: Needs assistance Equipment used: 1 person hand held assist Transfers: Sit to/from Stand Sit to Stand: Min assist           General transfer comment: Min A for gaining balance in standing     Balance Overall balance assessment: Needs assistance Sitting-balance support: Feet supported, No upper extremity supported Sitting balance-Leahy Scale: Fair     Standing balance support: During functional activity, Single extremity supported Standing balance-Leahy Scale: Fair Standing balance comment: Able to perform grooming tasks at sink without UE support                           ADL either performed or assessed with clinical judgement   ADL Overall ADL's : Needs assistance/impaired     Grooming: Brushing hair;Minimal assistance;Standing;Supervision/safety;Wash/dry face;Oral care;Wash/dry hands Grooming Details (indicate cue type and reason): Performing hand hygiene, oral care, and washing her face at sink with Supervision. moments of fatigue where she would lean onto her elbow. Able to reach up and brush her hair. Decreased coordination at first and benefiting from initatial Min A to guide strokes and normalized movement. Then able to perform with supervision                 Toilet Transfer: Minimal assistance;Ambulation;Regular Glass blower/designer Details (indicate cue type and reason): Min A for power up into standing Toileting- Clothing Manipulation and Hygiene: Sitting/lateral lean;Supervision/safety Toileting - Clothing Manipulation Details (indicate cue type and reason): Supervision for safety     Functional mobility during ADLs: Minimal assistance General ADL Comments: Demonstrating increased arousal and engagement. Continues to present with decreased cognition, balance, and activity tolerance    Extremity/Trunk Assessment  Upper Extremity Assessment Upper Extremity Assessment: RUE deficits/detail;LUE deficits/detail RUE Deficits / Details: Generalized weakness. Difficulty pushing to turn off faucet. Able to reach up and brush her hair. Decreased coordination at first and benefiting from initatial Min A to guide strokes and normalized movement. Then able to perform with supervision RUE Coordination: decreased fine motor;decreased gross motor LUE Deficits / Details: Generalized weakness and fatigues LUE Coordination: decreased fine motor;decreased gross motor   Lower Extremity Assessment Lower Extremity Assessment: Defer to PT evaluation        Vision       Perception     Praxis      Cognition Arousal/Alertness: Awake/alert Behavior During Therapy: WFL for tasks assessed/performed Overall Cognitive Status: Impaired/Different from baseline Area of Impairment: Orientation, Attention, Memory, Following commands, Safety/judgement, Awareness, Problem solving                 Orientation Level: Disoriented to, Situation (pt states "I have no idea what happened. I was out of it last week") Current Attention Level: Selective Memory: Decreased short-term memory Following Commands: Follows one step commands with increased time Safety/Judgement: Decreased awareness of safety, Decreased awareness of deficits Awareness: Emergent Problem Solving: Slow processing, Decreased initiation, Difficulty sequencing, Requires verbal cues, Requires tactile cues General Comments: Pt more awake and very happy to see OT upon arrival. Pt confirming that she doesnt remember last week. Also demonstrating increased awareness to discuss her deficits and attempt to recognize then errors occur during ADLs. Pt repeating herselfr during session.        Exercises      Shoulder Instructions       General Comments      Pertinent Vitals/ Pain       Pain Assessment Pain Assessment: No/denies pain  Home Living       Type  of Home: House                              Lives With: Alone    Prior Functioning/Environment              Frequency  Min 2X/week        Progress Toward Goals  OT Goals(current goals can now be found in the care plan section)  Progress towards OT goals: Progressing toward goals  Acute Rehab OT Goals OT Goal Formulation: Patient unable to participate in goal setting Time For Goal Achievement: 12/25/21 Potential to Achieve Goals: Good ADL Goals Pt Will Perform Grooming: with set-up;sitting Pt Will Perform Lower Body Bathing: with min assist;sitting/lateral leans;sit to/from stand Pt Will Perform Lower Body Dressing: with min assist;sitting/lateral leans;sit to/from stand Pt Will Transfer to Toilet: with min assist;stand pivot transfer Pt Will Perform Toileting - Clothing Manipulation and hygiene: with min assist;sitting/lateral leans;sit to/from stand Additional ADL Goal #1: Pt will follow 1-2 step commands 100% of the session with no verbal cues. Additional ADL Goal #2: Pt will complete a medication management task with no errors.  Plan Discharge plan remains appropriate    Co-evaluation                 AM-PAC OT "6 Clicks" Daily Activity     Outcome Measure   Help from another person eating meals?: A Little Help from another person taking care of personal grooming?: A Little Help from another person toileting, which includes using toliet, bedpan, or urinal?: A Little Help from another person bathing (including washing, rinsing, drying)?: A Lot Help  from another person to put on and taking off regular upper body clothing?: A Little Help from another person to put on and taking off regular lower body clothing?: A Lot 6 Click Score: 16    End of Session Equipment Utilized During Treatment: Gait belt  OT Visit Diagnosis: Unsteadiness on feet (R26.81);Other abnormalities of gait and mobility (R26.89);Muscle weakness (generalized) (M62.81);Other  symptoms and signs involving the nervous system (R29.898)   Activity Tolerance Patient tolerated treatment well   Patient Left in chair;with call bell/phone within reach;with chair alarm set   Nurse Communication Mobility status        Time: 1834-3735 OT Time Calculation (min): 29 min  Charges: OT General Charges $OT Visit: 1 Visit OT Treatments $Self Care/Home Management : 23-37 mins  [place], OTR/L Acute Rehab Pager: [phone] Office: [place] 12/16/2021, 4:22 PM

## 2021-12-16 NOTE — Progress Notes (Signed)
Subjective: No acute events overnight.  Patient states her memory and speech is gradually improving.  ROS: negative except above Examination  Vital signs in last 24 hours: Temp:  [97.7 F (36.5 C)-98.5 F (36.9 C)] 97.7 F (36.5 C) (06/05 0741) Pulse Rate:  [96-102] 101 (06/05 0741) Resp:  [16-20] 20 (06/05 0741) BP: (138-160)/(76-118) 146/94 (06/05 0800) SpO2:  [98 %-100 %] 98 % (06/05 0741) Weight:  [61.1 kg] 61.1 kg (06/05 0417)  General: lying in bed, NAD Neuro: AOx3, no evidence of aphasia, 3/5 5-minute recall, cranial nerves II to XII appear grossly intact, 5/5 in all 4 extremities  Basic Metabolic Panel: Recent Labs  Lab 12/09/21 1434 12/09/21 1434 12/10/21 0146 12/11/21 0221 12/11/21 0949 12/12/21 0137 12/13/21 0412 12/14/21 0022 12/16/21 0347  NA  --    < > 140 142 140 144 143 143 143  K  --    < > 3.8 2.6* 2.9* 3.7 4.1 4.0 2.9*  CL  --    < > 105 105 104 109 106 106 106  CO2  --    < > '26 26 29 26 26 27 24  '$ GLUCOSE  --    < > 145* 133* 157* 160* 135* 110* 112*  BUN  --    < > 31* 27* 25* 27* 25* 31* 38*  CREATININE  --    < > 2.48* 2.14* 2.00* 1.81* 1.72* 1.83* 1.74*  CALCIUM  --    < > 8.8* 8.4* 8.2* 8.5* 9.3 9.4 9.7  MG 1.8  --   --  1.6*  --   --  1.7  --   --   PHOS 4.2  --  3.1 3.3  --  2.3* 4.3 4.4  --    < > = values in this interval not displayed.    CBC: Recent Labs  Lab 12/10/21 0146 12/11/21 0221 12/12/21 0137 12/13/21 0412  WBC 18.7* 16.0* 12.8* 13.3*  NEUTROABS 16.2* 13.5* 10.6* 11.2*  HGB 8.2* 8.2* 7.8* 8.7*  HCT 24.6* 24.7* 24.5* 27.3*  MCV 87.2 88.2 90.7 91.9  PLT 134* 156 202 293     Coagulation Studies: No results for input(s): LABPROT, INR in the last 72 hours.  Imaging No new brain imaging overnight   ASSESSMENT AND PLAN: 66 year old female who was brought in on 5/90/2023 via EMS due to altered mental status.     Acute encephalopathy, suspected toxic-metabolic, resolved Seizures, resolved -Patient presented with  multiple toxic-metabolic abnormalities.  Mental status has significantly improved since then  Recommendations -We will reduce Keppra to 50 mg twice daily.  Of note, patient likely had provoked seizures and therefore this can potentially be weaned off in future if no seizures and EEG, MRI within normal limits -Continue Xanax 0.5 mg 3 times daily, will defer to primary team regarding further titration -PT/OT/Speech therapy -Continue seizure precautions, delirium precautions -Recommend follow-up with neurology in 3 months -Plan discussed with Dr. Loleta Books   I have spent a total of  35  minutes with the patient reviewing hospital notes,  test results, labs and examining the patient as well as establishing an assessment and plan that was discussed personally with the patient.  > 50% of time was spent in direct patient care.   Zeb Comfort Epilepsy [facility] For questions after 5pm please refer to AMION to reach the Neurologist on call

## 2021-12-16 NOTE — Evaluation (Addendum)
Speech Language Pathology Evaluation Patient Details Name: Carla Gomez MRN: [id number] DOB: February 04, 1956 Today's Date: 12/16/2021 Time: 1145-1200 SLP Time Calculation (min) (ACUTE ONLY): 15 min  Problem List:  Patient Active Problem List   Diagnosis Date Noted   Cognitive impairment 12/14/2021   Hypokalemia 19/62/2297   Metabolic acidosis 98/92/1194   Worms in stool, ruled out 12/12/2021   Hyponatremia 12/12/2021   Thrombocytopenia ([place]) 12/12/2021   Normocytic anemia 12/12/2021   Hypoglycemia    Aspiration pneumonia ([place]) 12/05/2021   Elevated sedimentation rate 12/05/2021   Status epilepticus ([place]) 12/05/2021   Delirium due to another medical condition 12/04/2021   Chronic pain 12/01/2021   Overweight (BMI 25.0-29.9) 11/30/2021   Acute renal failure (ARF) ([place]) 11/29/2021   Rhabdomyolysis 11/29/2021   Essential hypertension 11/29/2021   Dyslipidemia 11/29/2021   Anxiety 17/40/8144   Acute metabolic encephalopathy 81/85/6314   Labial cyst 11/07/2020   Midline cystocele 11/07/2020   Past Medical History:  Past Medical History:  Diagnosis Date   Cancer ([place])    basal cell   Chronic fatigue    Chronic pain    Dental bridge present    permanent bottom right   Fibromyalgia    Hypertension    Migraine headache    Past Surgical History:  Past Surgical History:  Procedure Laterality Date   ABDOMINAL HYSTERECTOMY     BREAST BIOPSY Left 09/26/14   lt bx/clip- benign   BREAST BIOPSY Left 11/02/14   benign   COLONOSCOPY WITH PROPOFOL N/A 12/21/2020   Procedure: COLONOSCOPY WITH PROPOFOL;  Surgeon: Lucilla Lame, MD;  Location: [facility];  Service: Endoscopy;  Laterality: N/A;   NASAL SINUS SURGERY     HPI:  Brettney Ficken, is a 66 y.o. female, who presented to the [facility] ED on 5/19 with a chief complaint of altered mental status. and found to have an AKI and rhabdomyolysis. PMH: fibromyalgia, chronic pain with migraines, hypertension. Transferred to [facility] on 5/25 for persistent altered mental status thought to be nonconvulsive status epilepticus. 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   Assessment / Plan / Recommendation Clinical Impression  Pt present with communicative and cognitive deficits with good awareness of motor speech impairments and decreased awareness of impact of cognitive needs. Her speech is marked by decreased prosody with interruptions in rhythm during sentences and conversation with hesitancies in addition to anomia. He was unable to express what level of education she had after additional time and multiple attempts. She could not name current state and provided with multiple semantic cues for "[place]." At one point she didn't understand SLP's question. She recalled 2/5 words with delay and indicated retrieval impairment after receiving semantic cues. Visual/verbal assist for use of call bell to call RN. Continued ST is recommended on acute afterwards at SNF.    SLP Assessment  SLP Recommendation/Assessment: Patient needs continued Speech [place] [facility] SLP Visit Diagnosis: Cognitive communication deficit (R41.841)    Recommendations for follow up therapy are one component of a multi-disciplinary discharge planning process, led by the attending physician.  Recommendations may be updated based on patient status, additional functional criteria and insurance authorization.    Follow Up Recommendations  Acute inpatient rehab (3hours/day)    Assistance Recommended at Discharge  Frequent or constant Supervision/Assistance  Functional Status Assessment Patient has had a recent decline in their functional status and demonstrates the ability to make significant improvements in function in a reasonable  and predictable amount of time.  Frequency and Duration min 2x/week  2 weeks      SLP Evaluation Cognition   Overall Cognitive Status: Impaired/Different from baseline Arousal/Alertness: Awake/alert Orientation Level: Oriented to person;Oriented to time (oriented to [place], not state) Year: 2023 Day of Week: Correct Attention: Sustained (sustained to speaker) Sustained Attention: Appears intact (to speaker, suspect decr for task) Memory: Impaired Memory Impairment: Retrieval deficit Awareness: Impaired Awareness Impairment: Anticipatory impairment Problem Solving: Impaired Problem Solving Impairment: Functional basic Executive Function: Self Correcting Self Correcting: Impaired Self Correcting Impairment: Verbal basic Safety/Judgment: Impaired       Comprehension  Auditory Comprehension Overall Auditory Comprehension: Impaired Yes/No Questions: Not tested Conversation: Simple (difficulty intermittenty comprehending basic information) Visual Recognition/Discrimination Discrimination: Not tested Reading Comprehension Reading Status:  (TBA)    Expression Expression Primary Mode of Expression: Verbal Verbal Expression Overall Verbal Expression: Impaired Initiation: No impairment Level of Generative/Spontaneous Verbalization: Conversation Repetition: No impairment Naming: No impairment Pragmatics: No impairment Written Expression Dominant Hand: Right Written Expression:  (TBA)   Oral / Motor  Oral Motor/Sensory Function Overall Oral Motor/Sensory Function: Within functional limits Motor Speech Overall Motor Speech: Impaired Respiration: Within functional limits Phonation: Normal Resonance: Within functional limits Articulation: Within functional limitis Intelligibility: Intelligible            [place] Siren 12/16/2021, 1:05 PM

## 2021-12-16 NOTE — Progress Notes (Signed)
Progress Note   Patient: Carla Gomez [id number] DOB: 06/03/1956 DOA: 12/05/2021     11 DOS: the patient was seen and examined on 12/16/2021 at 11:00AM      Brief hospital course: Carla Gomez is a 66 y.o. F with HTN and the migraine-fibromyalgia chronic pain syndrome who presented with confusion, found to have renal failure.  Evidently, neighbors heard her hollering, called EMS.  In the ER at [facility], Cr was 16.28 and she had BUN 123 and acidosis.     5/19: Admitted to [facility], started on fluids, renal US with medical disease 5/20: Nephrology consulted, minimal UOP, HD cath placed, underwent dialysis on 5/20  5/21: Maybe improved mentation 5/22: HD repeated 5/23: Psychiatry consulted 5/25: 3rd HD session, EEG obtained due to persistent poor mentation and showed continuous sharp wave discharges concerning for nonconvulsive status epilepticus, transferred to [facility] 5/26: Worsening agitation, transferred to ICU 5/28: LP performed 5/29: MRI brain with chronic small vessel disease only 5/30: ID consulted due to "worms" being observed in stool 5/31: Mentation improved, transferred OOU      Assessment and Plan: * Acute metabolic encephalopathy At baseline patient lives independently, has no prior diagnosis of cognitive impairment.  On arrival to OSH initially she was somnolent, disoriented, and unable to answer questions appropriately.  She has improved considerably, and we suspect her encephalopathy was primarily due to renal failure, likely toxic morphine metabolites.  Probably Xanax, Flexeril+TCAs were also contributing.  In addition, once here, after slower-than-expected resolution of her encephalopathy, she was found to be in nonconvulsive status epilepticus.  She was started on AEDs, transferred, and seizures ceased on LTM EEG.   She continues to have some subtle cognitive impairment.  Continued seizures were ruled out.  Sedating medicines have been minimized and at this point we  suspect any persistent cognitive impairment is residual from her status epilepticus, and the prognosis of recovery is unclear.  MRI brain 5/29 normal. - Xanax stopped - Transitioned to low dose clonazepam in order to facilitate future taper - Stop Vimpat - Reduce Keppra - Neurology plan to continue [place] at discharge with Neurology follow up in 3 months  - I would recommend neuropsychiatric testing at Neurology follow up  Delirium precautions:   -Lights and TV off, minimize interruptions at night  -Blinds open and lights on during day  -Glasses/hearing aid with patient  -Frequent reorientation  -PT/OT when able  -Avoid sedation medications/Beers list medications      Status epilepticus ([place]) Resolved - Continue Keppra - Neuro follow up in 3 months   Cognitive impairment Daughter provides collateral information that family had had concerns about cognitive impairment for many months prior to this illness (that she would be slightly incoherent or say slightly confused things on the phone at times though seemed to be managing her affairs).  This is likely the fefect of her benzodiazepines and opiates, but may have also reflected some pre-existing underlying dementia. - Recommend Neuropsychiatric testing after she has had time to rehabilitate  Normocytic anemia Hgb stable.  No iron deficiency  Thrombocytopenia (HCC) Platelets nadir 83K in setting of renal failure, pneumonia.  Now resolved.  Hyponatremia Resolved  Worms in stool, ruled out O+P negative.  This was a single unverified occurrence, likely mistaken observation.  Metabolic acidosis Resolved  Hypokalemia - Supplement K  Aspiration pneumonia ([place]) Sepsis ruled out. Pneumonia treated and resolved.  Chronic pain Patient has been on decades of daily opiates.  Here, she has obvious had these held for >  2 weeks and appears to have no symptoms of pain or withdrawal. - Avoid opiates for pain control  Overweight  (BMI 25.0-29.9) Chronic.  Anxiety Only on Xanax as an outpatient, no maintenance medication I can see. - Continue clonazepam for now, plan for future taper  Dyslipidemia Chronic. - Resume statin at discharge  Essential hypertension BP improved - Continue amlodipine and carvedilol   Rhabdomyolysis Improved from her original admission at [facility].     Acute renal failure (ARF) (HCC) Creatinine >16 on admission, CKD IV ruled out, she probably has chronic kidney disease but the stage couldn't be clinically determined at this point.  UOP normal now.  Cr stabilized around 1.7-1.8. - Follow up with Nephrology after discharge           Subjective: No seizures, no new fever, no respiratory symptoms, vomiting, malaise.     Physical Exam: Vitals:   12/16/21 0741 12/16/21 0800 12/16/21 1124 12/16/21 1200  BP: (!) 138/118 (!) 146/94 (!) 160/92   Pulse: (!) 101  96   Resp: 20  20   Temp: 97.7 F (36.5 C)  (!) 97.4 F (36.3 C)   TempSrc: Oral  Oral   SpO2: 98% 98% 100% 100%  Weight:      Height:       Elderly adult female, disheveled, sitting up in bed Relatively tachycardic, regular, no murmurs, no peripheral edema Respiratory rate normal, lungs clear without rales or wheezes Abdomen soft nontender palpation or guarding, no ascites or distention Attention seems normal throughout conversation, she has word finding difficulties, and short-term recall deficits that are marked, and she has mild tremor, but her upper extremity strength is symmetric, her face is symmetric, speech is fluent    Data Reviewed: Discussed with neurology, nursing notes reviewed, vital signs reviewed Basic metabolic panel reviewed  Family Communication: We will call to daughter this afternoon, daughter is primary POA    Disposition: Status is: Inpatient The patient was admitted to an outside hospital with acute metabolic encephalopathy due to renal failure, subsequently found to have status  epilepticus and transferred here for long-term EEG.  Renal failure has resolved, her metabolic and toxic encephalopathy has resolved is much as expected in the short-term, she is medically ready for discharge to skilled nursing when bed available        Author: Edwin Dada, MD 12/16/2021 1:45 PM  For on call review [email or web address].

## 2021-12-16 NOTE — TOC Progression Note (Addendum)
Transition of Care [facility]) - Progression Note    Patient Details  Name: Carla Gomez MRN: [id number] Date of Birth: 1956/01/01  Transition of Care [facility]) CM/SW [place], [place] Phone Number: 12/16/2021, 11:23 AM  Clinical Narrative:   CSW spoke with daughter to discuss SNF options, and family has chosen [facility]. CSW confirmed with [facility] that they have a bed available for patient, and will initiate insurance authorization request. CSW sent message to PT and OT about updated notes, and will submit auth request once notes are entered.   UPDATE 4:28 PM: CSW requested insurance authorization for patient to admit to [facility]. CSW to follow.    Expected Discharge Plan: [place] Barriers to Discharge: Continued Medical Work up, Ship broker  Expected Discharge Plan and Services Expected Discharge Plan: [place] Choice: [place] arrangements for the past 2 months: Apartment                                       Social Determinants of Health (SDOH) Interventions    Readmission Risk Interventions     View : No data to display.

## 2021-12-17 LAB — BASIC METABOLIC PANEL
Anion gap: 11 (ref 5–15)
BUN: 46 mg/dL — ABNORMAL HIGH (ref 8–23)
CO2: 25 mmol/L (ref 22–32)
Calcium: 9.6 mg/dL (ref 8.9–10.3)
Chloride: 110 mmol/L (ref 98–111)
Creatinine, Ser: 1.83 mg/dL — ABNORMAL HIGH (ref 0.44–1.00)
GFR, Estimated: 30 mL/min — ABNORMAL LOW (ref >60)
Glucose, Bld: 118 mg/dL — ABNORMAL HIGH (ref 70–99)
Potassium: 4.3 mmol/L (ref 3.5–5.1)
Sodium: 146 mmol/L — ABNORMAL HIGH (ref 135–145)

## 2021-12-17 LAB — CBC
HCT: 30.8 % — ABNORMAL LOW (ref 36.0–46.0)
Hemoglobin: 9.6 g/dL — ABNORMAL LOW (ref 12.0–15.0)
MCH: 29.9 pg (ref 26.0–34.0)
MCHC: 31.2 g/dL (ref 30.0–36.0)
MCV: 96 fL (ref 80.0–100.0)
Platelets: 309 10*3/uL (ref 150–400)
RBC: 3.21 MIL/uL — ABNORMAL LOW (ref 3.87–5.11)
RDW: 20.7 % — ABNORMAL HIGH (ref 11.5–15.5)
WBC: 8.8 10*3/uL (ref 4.0–10.5)
nRBC: 0 % (ref 0.0–0.2)

## 2021-12-17 NOTE — Discharge Instructions (Addendum)
From Dr. Loleta Books: You were admitted to the hospital on May 19th with kidney failure.  No one is certain of exact events leading up to your illness, but based on the degree of kidney failure, we can be confident that this was something that developed probably gradually over a week or more, until you finally became so weak from kidney failure (and the accompanying build up of dangerous medicinse) that you collapsed in your home.  On the day of admission, some of your neighbors heard you and activated EMS who found you on the floor of your home and brought you to the ER at [facility] first.  In the ER, they found the kidney failure, and started you on treatment and evaluated by the kidney specialists at [facility].  It is common for people with kidney failure who take large doses of morphine, Xanax and hydrocodone to be confused, and so this was not unexpected.   Your kidney function did not improve as expected however, and so the kidney specialists started you on dialysis, and did several dialysis treatments.  At first your thinking seemed to be getting better (with holding any medicines that might cause confusion, and with doing dialysis to clear out the toxins in your blood) but after a few days it became clear that you were not getting better as expected, and so the brain specialists (neurologists) looked for seizure activity and found that you were having continuous clinically silent seizures (this is called "focal status epilepticus").  You were transferred to [facility] at that point, where they have the capability to do more specialized EEG monitoring.  You had a spinal tap to sample the fluid around your spine, and this showed no evidence (that we could tell) for infection, autoimmune disease or cancer that might lead to confusion.  You had an MRI of the brain that also showed no evidence of stroke or mass.  At that point, just with time and with improvement of your kidney function,  your thinking has gotten better.  We suspect that a lot of the damage was toxic damage from morphine buildup when your kidneys failed, as well as damage from the continuous seizure activity that was probably going for a day or more.    Hopefully both of these things will continue to slowly resolve and your memory will get better.  It would be important for you to get cognitive testing in 6-12 months to follow up.

## 2021-12-17 NOTE — Progress Notes (Signed)
Physical Therapy Treatment Patient Details Name: Carla Gomez MRN: [id number] DOB: 1955/12/15 Today's Date: 12/17/2021   History of Present Illness 66 y.o. F admitted on 5/25 due to nonconvulsive status epilepticus and acute renal failure. MRI neg for acute changes. PMH significant for chronic pain on chronic opiates, chronic benzodiazepine use, fibromyalgia, hyperlipidemia.    PT Comments    Pt continues with confusion, word finding difficulty, poor attn span/easily distracted, impaired processing and sequencing, impaired balance, and difficulty ambulating. Pt constantly closing eyes, reports dizziness but denies room spinning and reports seeing a blurry horizontal line. Suspect possible vestibular component however haven't been able to properly assess due to patients impaired cognition. In sitting pt also keeping eyes closed. Pt continues to require modA for mobility. Due to both cognitive and functional impairments pt continues to benefit from SNF Upon d/c for maximal functional recovery.    Recommendations for follow up therapy are one component of a multi-disciplinary discharge planning process, led by the attending physician.  Recommendations may be updated based on patient status, additional functional criteria and insurance authorization.  Follow Up Recommendations  Skilled nursing-short term rehab (<3 hours/day)     Assistance Recommended at Discharge Intermittent Supervision/Assistance  Patient can return home with the following A lot of help with walking and/or transfers;A lot of help with bathing/dressing/bathroom;Direct supervision/assist for medications management;Direct supervision/assist for financial management;Assist for transportation;Help with stairs or ramp for entrance   Equipment Recommendations   (TBD)    Recommendations for Other Services       Precautions / Restrictions Precautions Precautions: Fall Restrictions Weight Bearing Restrictions: No      Mobility  Bed Mobility Overal bed mobility: Needs Assistance Bed Mobility: Supine to Sit Rolling: Mod assist, +2 for physical assistance, +2 for safety/equipment   Supine to sit: HOB elevated, Min assist     General bed mobility comments: Pt in recliner upon arrival and return at end of session    Transfers Overall transfer level: Needs assistance Equipment used: 1 person hand held assist Transfers: Sit to/from Stand, Bed to chair/wheelchair/BSC Sit to Stand: Mod assist   Step pivot transfers: Mod assist       General transfer comment: max directional verbal cues to complete task, tactile cues for weight shifting, pt with eyes closed, pt with short shuffled steps, pt guarded with stepping and reports "my knees are so weak" pt also c/o dizziness but keeps eyes closed Transfer via Lift Equipment: Stedy  Ambulation/Gait Ambulation/Gait assistance: Mod assist, +2 safety/equipment Gait Distance (Feet): 10 Feet Assistive device: 2 person hand held assist Gait Pattern/deviations: Step-through pattern, Decreased stride length, Shuffle, Wide base of support Gait velocity: slow Gait velocity interpretation: <1.8 ft/sec, indicate of risk for recurrent falls   General Gait Details: pt easily distracted, short shuffled steps, pt constantly closing eyes, hard time focusing/practicing gaze stabilization, pt very reluctant to amb, suspecting due to fear as pt very guarded and cautious, question vestibular component and/or vision impairment   Stairs             Wheelchair Mobility    Modified Rankin (Stroke Patients Only)       Balance Overall balance assessment: Needs assistance Sitting-balance support: Feet supported, No upper extremity supported Sitting balance-Leahy Scale: Fair     Standing balance support: During functional activity, Single extremity supported Standing balance-Leahy Scale: Fair Standing balance comment: Able to perform pericare in standing with  minA  Cognition Arousal/Alertness: Awake/alert Behavior During Therapy: WFL for tasks assessed/performed Overall Cognitive Status: Impaired/Different from baseline Area of Impairment: Attention, Memory, Following commands, Safety/judgement, Awareness, Problem solving                 Orientation Level: Situation Current Attention Level: Sustained (very easily distracted) Memory: Decreased short-term memory Following Commands: Follows one step commands with increased time Safety/Judgement: Decreased awareness of safety, Decreased awareness of deficits Awareness: Emergent Problem Solving: Slow processing, Decreased initiation, Difficulty sequencing, Requires verbal cues, Requires tactile cues General Comments: pt easily distracted, appears to have vision deficits and keeps eyes closed alot, may have vestibular component, pt with word finding difficulties. unable to complete sentences        Exercises      General Comments General comments (skin integrity, edema, etc.): VSS, pt assisted to [facility], + urination and BM.      Pertinent Vitals/Pain Pain Assessment Pain Assessment: No/denies pain    Home Living                          Prior Function            PT Goals (current goals can now be found in the care plan section) Acute Rehab PT Goals PT Goal Formulation: Patient unable to participate in goal setting Time For Goal Achievement: 12/26/21 Potential to Achieve Goals: Good Progress towards PT goals: Progressing toward goals    Frequency    Min 3X/week      PT Plan Discharge plan needs to be updated    Co-evaluation              AM-PAC PT "6 Clicks" Mobility   Outcome Measure  Help needed turning from your back to your side while in a flat bed without using bedrails?: A Little Help needed moving from lying on your back to sitting on the side of a flat bed without using bedrails?: A Little Help needed  moving to and from a bed to a chair (including a wheelchair)?: A Lot Help needed standing up from a chair using your arms (e.g., wheelchair or bedside chair)?: A Little Help needed to walk in hospital room?: A Lot Help needed climbing 3-5 steps with a railing? : A Lot 6 Click Score: 15    End of Session Equipment Utilized During Treatment: Gait belt Activity Tolerance: Patient tolerated treatment well Patient left: in chair;with call bell/phone within reach;with chair alarm set Nurse Communication: Mobility status PT Visit Diagnosis: Difficulty in walking, not elsewhere classified (R26.2)     Time: 9379-0240 PT Time Calculation (min) (ACUTE ONLY): 30 min  Charges:  $Gait Training: 8-22 mins $Therapeutic Activity: 8-22 mins                     Kittie Plater, PT, DPT Acute Rehabilitation Services Secure chat preferred Office #: [phone]    Berline Lopes 12/17/2021, 12:47 PM

## 2021-12-17 NOTE — Progress Notes (Signed)
Progress Note   Patient: Carla Gomez XTG:[id number] DOB: 01-04-1956 DOA: 12/05/2021     12 DOS: the patient was seen and examined on 12/17/2021 at 2:13PM      Brief hospital course: Mrs. Coles is a 66 y.o. F with HTN and the migraine-fibromyalgia chronic pain syndrome who presented with being found down, found to have renal failure.  Evidently, neighbors heard her hollering, called EMS.  In the ER at [facility], Cr was 16.28 and she had BUN 123 and acidosis.     5/19: Admitted to [facility], started on fluids, renal US with medical disease 5/20: Nephrology consulted, minimal UOP, HD cath placed, underwent dialysis on 5/20  5/21: Maybe improved mentation 5/22: HD repeated 5/23: Psychiatry consulted 5/25: 3rd HD session, EEG obtained due to persistent poor mentation and showed continuous sharp wave discharges concerning for nonconvulsive status epilepticus, transferred to [facility] 5/26: Worsening agitation, transferred to ICU 5/28: LP performed 5/29: MRI brain with chronic small vessel disease only 5/30: ID consulted due to "worms" being observed in stool 5/31: Mentation improved, transferred OOU      Assessment and Plan: * Acute metabolic encephalopathy At baseline patient lived independently, has no prior diagnosis of cognitive impairment.  On arrival to OSH initially she was somnolent, disoriented, and unable to answer questions appropriately.  She has improved considerably, and we suspect her encephalopathy was primarily due to renal failure, likely toxic morphine metabolites.  Probably Xanax, Flexeril+TCAs were also contributing.  In addition, once here, after slower-than-expected resolution of her encephalopathy, she was found to be in nonconvulsive status epilepticus.    She was started on AEDs, transferred, and seizures ceased on LTM EEG.   She continues to have some subtle cognitive impairment.  Continued seizures were ruled out.  Sedating medicines have been minimized and at this  point we suspect any persistent cognitive impairment is residual from her status epilepticus, and the prognosis of recovery is unclear.  MRI brain 5/29 normal.  LP showed no signs of HSV and negative encephalitis panel, NMDA rec IgG negative.  - Xanax stopped - She was transitioned to low dose clonazepam and the plan over the next month should be slow taper - Continue Keppra  - Neurology plan to continue [place] at discharge with Neurology follow up in 3 months  - Needs Neuropsychiatric/cognitive testing in 6 months      Status epilepticus ([place]) Resolved - Continue Keppra - Neuro follow up in 3 months   Cognitive impairment Daughter provides collateral information that family had had concerns about cognitive impairment for many months prior to this illness (that she would be slightly incoherent or say slightly confused things on the phone at times though seemed to be managing her affairs).  This is likely the fefect of her benzodiazepines and opiates, but may have also reflected some pre-existing underlying dementia. - Recommend Neuropsychiatric testing after she has had time to rehabilitate  Normocytic anemia Hgb stable.  No iron deficiency  Thrombocytopenia (HCC) Platelets nadir 83K in setting of renal failure, pneumonia.  Now resolved.  Hyponatremia Resolved  Worms in stool, ruled out O+P negative.  This was a single unverified occurrence, likely mistaken observation.  Metabolic acidosis Resolved  Hypokalemia Resolved  Aspiration pneumonia ([place]) Sepsis ruled out. Pneumonia treated and resolved.  Chronic pain Patient has been on decades of daily opiates.  Here, she has obvious had these held for >2 weeks and appears to have NO symptoms of pain or withdrawal.   Opiates should NOT be restarted  for this patient. - Avoid opiates for pain control  Overweight (BMI 25.0-29.9) Chronic.  Anxiety Only on Xanax as an outpatient, no maintenance medication I can see. -  Continue clonazepam for now, plan for future taper  Dyslipidemia Chronic. - Resume statin at discharge  Essential hypertension BP improved - Continue amlodipine and carvedilol   Rhabdomyolysis Improved from her original admission at [facility]     Acute renal failure (ARF) (HCC) Creatinine >16 on admission, CKD IV ruled out, she probably has chronic kidney disease but the stage couldn't be clinically determined at this point.  UOP normal now.  Cr stabilized around 1.7-1.8.  She probably will ultimately have some CKD. - Follow up with Nephrology after discharge           Subjective: No complaints.  No pain.     Physical Exam: Vitals:   12/17/21 0400 12/17/21 0444 12/17/21 0753 12/17/21 1201  BP: (!) 148/86  (!) 164/95 (!) 156/97  Pulse: 88  95 (!) 102  Resp: '18  16 16  '$ Temp: 98.8 F (37.1 C)  99 F (37.2 C) 98.9 F (37.2 C)  TempSrc: Oral  Oral Oral  SpO2: 98%  98% 100%  Weight:  62.1 kg    Height:       Adult female, lying in bed, no acute distress, watching television RRR, no murmurs, no peripheral edema Respiratory rate normal, lungs clear without rales or wheezes Moves upper extremities with normal strength and coordination, speech is stuttering and sometimes halting, she has some medical problems and frequent word finding difficulty, and clearly has impaired short-term memory  Data Reviewed: Nursing notes reviewed, vital signs reviewed Basic metabolic panel shows sodium 146, potassium normal, creatinine stable at 1.8, hemoglobin 9.6, unchanged  Family Communication: We will call to daughter/POA Jola Schmidt later today.    Disposition: Status is: Inpatient The patient was admitted with acute renal failure and severe encephalopathy.  She was subsequently found to have status epilepticus.  Her medical issues of seizures and renal failure have both improved and resolved, and her cognitive impairment is stabilized.  She is medically ready for discharge to rehab  as soon as a bed is available        Author: Edwin Dada, MD 12/17/2021 3:44 PM  For on call review [email or web address].

## 2021-12-18 LAB — COMPREHENSIVE METABOLIC PANEL
ALT: 83 U/L — ABNORMAL HIGH (ref 0–44)
AST: 51 U/L — ABNORMAL HIGH (ref 15–41)
Albumin: 3.9 g/dL (ref 3.5–5.0)
Alkaline Phosphatase: 129 U/L — ABNORMAL HIGH (ref 38–126)
Anion gap: 12 (ref 5–15)
BUN: 41 mg/dL — ABNORMAL HIGH (ref 8–23)
CO2: 25 mmol/L (ref 22–32)
Calcium: 9.7 mg/dL (ref 8.9–10.3)
Chloride: 106 mmol/L (ref 98–111)
Creatinine, Ser: 1.66 mg/dL — ABNORMAL HIGH (ref 0.44–1.00)
GFR, Estimated: 33 mL/min — ABNORMAL LOW (ref >60)
Glucose, Bld: 119 mg/dL — ABNORMAL HIGH (ref 70–99)
Potassium: 3.7 mmol/L (ref 3.5–5.1)
Sodium: 143 mmol/L (ref 135–145)
Total Bilirubin: 0.7 mg/dL (ref 0.3–1.2)
Total Protein: 7.2 g/dL (ref 6.5–8.1)

## 2021-12-18 LAB — CBC
HCT: 30.8 % — ABNORMAL LOW (ref 36.0–46.0)
Hemoglobin: 9.7 g/dL — ABNORMAL LOW (ref 12.0–15.0)
MCH: 30.5 pg (ref 26.0–34.0)
MCHC: 31.5 g/dL (ref 30.0–36.0)
MCV: 96.9 fL (ref 80.0–100.0)
Platelets: 379 10*3/uL (ref 150–400)
RBC: 3.18 MIL/uL — ABNORMAL LOW (ref 3.87–5.11)
RDW: 20.7 % — ABNORMAL HIGH (ref 11.5–15.5)
WBC: 7.6 10*3/uL (ref 4.0–10.5)
nRBC: 0 % (ref 0.0–0.2)

## 2021-12-18 LAB — MISC LABCORP TEST (SEND OUT): Labcorp test code: 9985

## 2021-12-18 MED ORDER — POLYETHYLENE GLYCOL 3350 17 G PO PACK: 17.0000 g | PACK | Freq: Every day | ORAL | 0 refills | Status: AC

## 2021-12-18 MED ORDER — CARVEDILOL 6.25 MG PO TABS: 6.2500 mg | ORAL_TABLET | Freq: Two times a day (BID) | ORAL | Status: AC

## 2021-12-18 MED ORDER — AMLODIPINE BESYLATE 10 MG PO TABS: 10.0000 mg | ORAL_TABLET | Freq: Every day | ORAL | Status: AC

## 2021-12-18 MED ORDER — LEVETIRACETAM 250 MG PO TABS: 250.0000 mg | ORAL_TABLET | Freq: Two times a day (BID) | ORAL | Status: AC

## 2021-12-18 MED ORDER — CLONAZEPAM 0.5 MG PO TABS: 0.5000 mg | ORAL_TABLET | Freq: Two times a day (BID) | ORAL | 0 refills | Status: AC

## 2021-12-18 MED ORDER — SENNOSIDES-DOCUSATE SODIUM 8.6-50 MG PO TABS
1.0000 | ORAL_TABLET | Freq: Two times a day (BID) | ORAL | Status: AC
Start: 2021-12-18 — End: ?

## 2021-12-18 NOTE — Discharge Summary (Signed)
Physician Discharge Summary   Carla Gomez CHY:[id number] DOB: April 04, 1956 DOA: 12/05/2021  PCP: Albina Billet, MD  Admit date: 12/05/2021 Discharge date: 12/18/2021  Admitted From: Home Disposition:  S[facility] - [facility] Discharg[facility] physician: Dwyane Dee, MD  Recommendations for Outpatient Follow-up:  Needs taper of klonopin over 3 -4 weeks Recommended to not be restarted on chronic opioids for hx chronic pain   Discharge Condition: stable CODE STATUS: Full Diet recommendation:  Diet Orders (From admission, onward)     Start     Ordered   12/18/21 0000  Diet general        12/18/21 1025   12/13/21 1000  Diet regular Room service appropriate? Yes; Fluid consistency: Thin  Diet effective 1000       Comments: Need isolation trays  Question Answer Comment  Room service appropriate? Yes   Fluid consistency: Thin      12/13/21 8786            Hospital Course: Mrs. Cones is a 66 y.o. Female with PMH HTN, chronic pain, migraine/fibromyalgia who presented after being found down. Workup also showed AKI. Evidently, neighbors heard her hollering, called EMS.  In the ER at [facility], Cr was 16.28 and she had BUN 123 and acidosis.    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 Possibly considered a mistaken observation; no recurrence. 5/31: Mentation improved, transferred OOU   Assessment and Plan: * Acute metabolic encephalopathy - At  baseline patient lived independently, has no prior diagnosis of cognitive impairment.  On arrival to OSH initially she was somnolent, disoriented, and unable to answer questions appropriately. 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  Continued seizures were ruled out.  Sedating medicines have been minimized and at this point we suspect any persistent cognitive impairment is residual from her status epilepticus, and the prognosis of recovery is unclear - MRI brain 5/29 normal.  LP showed no signs of HSV and negative encephalitis panel, NMDA rec IgG negative. - Xanax stopped; She was transitioned to low dose clonazepam and the plan over the next month should be slow taper - Neurology follow up in 3 months  - Needs Neuropsychiatric/cognitive testing in 6 months      Status epilepticus (HCC)-resolved as of 12/18/2021 - Continue Keppra - Neuro follow up in 3 months   Acute renal failure (ARF) (HCC) - Creatinine >16 on admission, CKD IV ruled out, she probably has chronic kidney disease but the stage couldn't be clinically determined at this point. - UOP normal now.  Cr stabilized around 1.7-1.8.  She probably will ultimately have some CKD. - Follow up with Nephrology after discharge   Cognitive impairment - Daughter provides collateral information that family had had concerns about cognitive impairment for many months prior  to this illness (that she would be slightly incoherent or say slightly confused things on the phone at times though seemed to be managing her affairs).  This is likely the effect of her benzodiazepines and opiates, but may have also reflected some pre-existing underlying  dementia. - Recommend Neuropsychiatric testing after she has had time to rehabilitate  Normocytic anemia Hgb stable.  No iron deficiency  Chronic pain - Patient has been on decades of daily opiates.   - morphine now discontinued; no withdrawal symptoms - recommended to not resume opioids  Overweight (BMI 25.0-29.9) Chronic.  Anxiety Only on Xanax as an outpatient - Continue clonazepam for now, plan for taper  Dyslipidemia - continue statin  Essential hypertension - Continue amlodipine and carvedilol   Thrombocytopenia (HCC)-resolved as of 12/18/2021 Platelets nadir 83K in setting of renal failure, pneumonia.  Now resolved.  Hyponatremia-resolved as of 12/18/2021 Resolved  Worms in stool, ruled out-resolved as of 12/18/2021 O+P negative.  This was a single unverified occurrence, likely mistaken observation.  Metabolic acidosis-resolved as of 12/18/2021 Resolved  Hypokalemia-resolved as of 12/18/2021 Resolved  Aspiration pneumonia (HCC)-resolved as of 12/18/2021 Sepsis ruled out. Pneumonia treated and resolved.  Rhabdomyolysis-resolved as of 12/18/2021 Improved from her original admission at [facility].      Principal Diagnosis: Acute metabolic encephalopathy  Discharge Diagnoses: Principal Problem:   Acute metabolic encephalopathy Active Problems:   Toxic encephalopathy   Acute renal failure (ARF) (HCC)   Essential hypertension   Dyslipidemia   Anxiety   Chronic pain   Normocytic anemia   Cognitive impairment   Discharge Instructions     Diet general   Complete by: As directed    Increase activity slowly   Complete by: As directed       Allergies as of 12/18/2021       Reactions   Citrus Other (See Comments)   Triggers migraines   Latex Other (See Comments)   Redness around mouth after dental procedures   Other Other (See Comments)   ALLERGENS: Exposure to Inhalants such as grasses, pollen, trees, mold, mildew, cat dander, cows.  REACTION: post nasal  drainage, runny nose and/or chronic sinus infections        Medication List     STOP taking these medications    Ampicillin-Sulbactam 3 g in sodium chloride 0.9 % 100 mL   cyclobenzaprine 10 MG tablet Commonly known as: FLEXERIL   heparin 5000 UNIT/ML injection   levETIRAcetam 500 MG/100ML Soln Commonly known as: KEPRRA   metoprolol tartrate 5 MG/5ML Soln injection Commonly known as: LOPRESSOR   morphine 100 MG 12 hr tablet Commonly known as: MS CONTIN   naloxone 4 MG/0.1ML Liqd nasal spray kit Commonly known as: NARCAN   ondansetron 4 MG/2ML Soln injection Commonly known as: ZOFRAN   sodium chloride 0.9 % infusion   SUMAtriptan 100 MG tablet Commonly known as: IMITREX       TAKE these medications    acetaminophen 325 MG tablet Commonly known as: TYLENOL Take 2 tablets (650 mg total) by mouth every 6 (six) hours as needed for mild pain (or Fever >/= 101).   amLODipine 10 MG tablet Commonly known as: NORVASC Take 1 tablet (10 mg total) by mouth daily. What changed:  medication strength how much to take   carvedilol 6.25 MG tablet Commonly known as: COREG Take 1 tablet (6.25 mg total) by mouth 2 (two) times daily with a meal.   clonazePAM 0.5 MG tablet Commonly known as: KLONOPIN Take 1 tablet (  0.5 mg total) by mouth 2 (two) times daily.   levETIRAcetam 250 MG tablet Commonly known as: KEPPRA Take 1 tablet (250 mg total) by mouth 2 (two) times daily.   multivitamin with minerals Tabs tablet Take 1 tablet by mouth daily.   polyethylene glycol 17 g packet Commonly known as: MIRALAX / GLYCOLAX Take 17 g by mouth daily.   rosuvastatin 20 MG tablet Commonly known as: CRESTOR Take 20 mg by mouth daily at 6 PM.   senna-docusate 8.6-50 MG tablet Commonly known as: Senokot-S Take 1 tablet by mouth 2 (two) times daily.   Vitamin D 50 MCG (2000 UT) Caps Take 2,000 Units by mouth daily.        Allergies  Allergen Reactions   Citrus Other (See  Comments)    Triggers migraines   Latex Other (See Comments)    Redness around mouth after dental procedures   Other Other (See Comments)    ALLERGENS: Exposure to Inhalants such as grasses, pollen, trees, mold, mildew, cat dander, cows.  REACTION: post nasal drainage, runny nose and/or chronic sinus infections    Discharge Exam: BP (!) 153/92 (BP Location: Right Arm)   Pulse 98   Temp 98.6 F (37 C) (Oral)   Resp 18   Ht 5' 4"  (1.626 m)   Wt 63.5 kg   SpO2 96%   BMI 24.03 kg/m  Physical Exam Constitutional:      General: She is not in acute distress.    Appearance: Normal appearance.  HENT:     Head: Normocephalic and atraumatic.     Mouth/Throat:     Mouth: Mucous membranes are moist.  Eyes:     Extraocular Movements: Extraocular movements intact.     Pupils: Pupils are equal, round, and reactive to light.  Cardiovascular:     Rate and Rhythm: Normal rate and regular rhythm.  Pulmonary:     Effort: Pulmonary effort is normal.     Breath sounds: Normal breath sounds.  Abdominal:     General: Bowel sounds are normal. There is no distension.     Palpations: Abdomen is soft.     Tenderness: There is no abdominal tenderness.  Musculoskeletal:        General: No swelling. Normal range of motion.     Cervical back: Normal range of motion and neck supple.  Skin:    General: Skin is warm and dry.  Neurological:     Mental Status: She is alert.     Comments: Still some cognitive impairment but follows commands easily and moves all 4 extremities with commands; no focal weakness or paresthesias  Psychiatric:        Mood and Affect: Mood normal.     The results of significant diagnostics from this hospitalization (including imaging, microbiology, ancillary and laboratory) are listed below for reference.   Microbiology: Recent Results (from the past 240 hour(s))  CSF culture w Gram Stain     Status: None   Collection Time: 12/08/21 11:52 AM   Specimen: CSF; Cerebrospinal  Fluid  Result Value Ref Range Status   Specimen Description CSF  Final   Special Requests NONE  Final   Gram Stain   Final    WBC PRESENT, PREDOMINANTLY PMN NO ORGANISMS SEEN CYTOSPIN SMEAR    Culture   Final    NO GROWTH 3 DAYS Performed at [facility], [place]. [address] [place], [place] [address]    Report Status 12/11/2021 FINAL  Final  OVA +  PARASITE EXAM     Status: None   Collection Time: 12/09/21  6:21 PM   Specimen: Stool  Result Value Ref Range Status   OVA + PARASITE EXAM Final report  Final    Comment: (NOTE) These results were obtained using wet preparation(s) and trichrome stained smear. This test does not include testing for Cryptosporidium parvum, Cyclospora, or Microsporidia. Performed At: [facility] [place], [place] [id number] Rush Farmer MD HN:[id number]    Source of Sample STOOL  Final    Comment: Performed at [facility], [place] [address], [place], [place] [address]  Gastrointestinal Panel by PCR , Stool     Status: None   Collection Time: 12/09/21  6:50 PM   Specimen: Stool  Result Value Ref Range Status   Campylobacter species NOT DETECTED NOT DETECTED Final   Plesimonas shigelloides NOT DETECTED NOT DETECTED Final   Salmonella species NOT DETECTED NOT DETECTED Final   Yersinia enterocolitica NOT DETECTED NOT DETECTED Final   Vibrio species NOT DETECTED NOT DETECTED Final   Vibrio cholerae NOT DETECTED NOT DETECTED Final   Enteroaggregative E coli (EAEC) NOT DETECTED NOT DETECTED Final   Enteropathogenic E coli (EPEC) NOT DETECTED NOT DETECTED Final   Enterotoxigenic E coli (ETEC) NOT DETECTED NOT DETECTED Final   Shiga like toxin producing E coli (STEC) NOT DETECTED NOT DETECTED Final   Shigella/Enteroinvasive E coli (EIEC) NOT DETECTED NOT DETECTED Final   Cryptosporidium NOT DETECTED NOT DETECTED Final   Cyclospora cayetanensis NOT DETECTED NOT DETECTED Final   Entamoeba histolytica NOT DETECTED NOT DETECTED  Final   Giardia lamblia NOT DETECTED NOT DETECTED Final   Adenovirus F40/41 NOT DETECTED NOT DETECTED Final   Astrovirus NOT DETECTED NOT DETECTED Final   Norovirus GI/GII NOT DETECTED NOT DETECTED Final   Rotavirus A NOT DETECTED NOT DETECTED Final   Sapovirus (I, II, IV, and V) NOT DETECTED NOT DETECTED Final    Comment: Performed at [facility], [address] [place], [place] [address]     Labs: BNP (last 3 results) No results for input(s): BNP in the last 8760 hours. Basic Metabolic Panel: Recent Labs  Lab 12/12/21 0137 12/13/21 0412 12/14/21 0022 12/16/21 0347 12/17/21 0328 12/18/21 0556  NA 144 143 143 143 146* 143  K 3.7 4.1 4.0 2.9* 4.3 3.7  CL 109 106 106 106 110 106  CO2 26 26 27 24 25 25   GLUCOSE 160* 135* 110* 112* 118* 119*  BUN 27* 25* 31* 38* 46* 41*  CREATININE 1.81* 1.72* 1.83* 1.74* 1.83* 1.66*  CALCIUM 8.5* 9.3 9.4 9.7 9.6 9.7  MG  --  1.7  --   --   --   --   PHOS 2.3* 4.3 4.4  --   --   --    Liver Function Tests: Recent Labs  Lab 12/12/21 0137 12/13/21 0412 12/14/21 0022 12/18/21 0556  AST  --   --   --  51*  ALT  --   --   --  83*  ALKPHOS  --   --   --  129*  BILITOT  --   --   --  0.7  PROT  --   --   --  7.2  ALBUMIN 3.0* 3.2* 3.4* 3.9   No results for input(s): LIPASE, AMYLASE in the last 168 hours. No results for input(s): AMMONIA in the last 168 hours. CBC: Recent Labs  Lab 12/12/21 0137 12/13/21 0412 12/17/21 0328 12/18/21  0556  WBC 12.8* 13.3* 8.8 7.6  NEUTROABS 10.6* 11.2*  --   --   HGB 7.8* 8.7* 9.6* 9.7*  HCT 24.5* 27.3* 30.8* 30.8*  MCV 90.7 91.9 96.0 96.9  PLT 202 293 309 379   Cardiac Enzymes: No results for input(s): CKTOTAL, CKMB, CKMBINDEX, TROPONINI in the last 168 hours. BNP: Invalid input(s): POCBNP CBG: Recent Labs  Lab 12/13/21 2202 12/14/21 0602 12/14/21 1249 12/14/21 1613 12/15/21 1234  GLUCAP 166* 106* 110* 116* 117*   D-Dimer No results for input(s): DDIMER in the last 72  hours. Hgb A1c No results for input(s): HGBA1C in the last 72 hours. Lipid Profile No results for input(s): CHOL, HDL, LDLCALC, TRIG, CHOLHDL, LDLDIRECT in the last 72 hours. Thyroid function studies No results for input(s): TSH, T4TOTAL, T3FREE, THYROIDAB in the last 72 hours.  Invalid input(s): FREET3 Anemia work up No results for input(s): VITAMINB12, FOLATE, FERRITIN, TIBC, IRON, RETICCTPCT in the last 72 hours. Urinalysis    Component Value Date/Time   COLORURINE YELLOW (A) 11/29/2021 2021   APPEARANCEUR HAZY (A) 11/29/2021 2021   LABSPEC 1.011 11/29/2021 2021   PHURINE 5.0 11/29/2021 2021   GLUCOSEU NEGATIVE 11/29/2021 2021   HGBUR LARGE (A) 11/29/2021 2021   BILIRUBINUR NEGATIVE 11/29/2021 [place] 11/29/2021 2021   PROTEINUR 100 (A) 11/29/2021 2021   NITRITE NEGATIVE 11/29/2021 2021   LEUKOCYTESUR NEGATIVE 11/29/2021 2021   Sepsis Labs Invalid input(s): PROCALCITONIN,  WBC,  LACTICIDVEN Microbiology Recent Results (from the past 240 hour(s))  CSF culture w Gram Stain     Status: None   Collection Time: 12/08/21 11:52 AM   Specimen: CSF; Cerebrospinal Fluid  Result Value Ref Range Status   Specimen Description CSF  Final   Special Requests NONE  Final   Gram Stain   Final    WBC PRESENT, PREDOMINANTLY PMN NO ORGANISMS SEEN CYTOSPIN SMEAR    Culture   Final    NO GROWTH 3 DAYS Performed at [facility], [place] [address] [place], [place] [address]    Report Status 12/11/2021 FINAL  Final  OVA + PARASITE EXAM     Status: None   Collection Time: 12/09/21  6:21 PM   Specimen: Stool  Result Value Ref Range Status   OVA + PARASITE EXAM Final report  Final    Comment: (NOTE) These results were obtained using wet preparation(s) and trichrome stained smear. This test does not include testing for Cryptosporidium parvum, Cyclospora, or Microsporidia. Performed At: [facility] [place], [place] [id number] Rush Farmer  MD EF:[id number]    Source of Sample STOOL  Final    Comment: Performed at [facility], [place] [address], [place], [place] [address]  Gastrointestinal Panel by PCR , Stool     Status: None   Collection Time: 12/09/21  6:50 PM   Specimen: Stool  Result Value Ref Range Status   Campylobacter species NOT DETECTED NOT DETECTED Final   Plesimonas shigelloides NOT DETECTED NOT DETECTED Final   Salmonella species NOT DETECTED NOT DETECTED Final   Yersinia enterocolitica NOT DETECTED NOT DETECTED Final   Vibrio species NOT DETECTED NOT DETECTED Final   Vibrio cholerae NOT DETECTED NOT DETECTED Final   Enteroaggregative E coli (EAEC) NOT DETECTED NOT DETECTED Final   Enteropathogenic E coli (EPEC) NOT DETECTED NOT DETECTED Final   Enterotoxigenic E coli (ETEC) NOT DETECTED NOT DETECTED Final   Shiga like toxin producing E coli (STEC) NOT DETECTED NOT  DETECTED Final   Shigella/Enteroinvasive E coli (EIEC) NOT DETECTED NOT DETECTED Final   Cryptosporidium NOT DETECTED NOT DETECTED Final   Cyclospora cayetanensis NOT DETECTED NOT DETECTED Final   Entamoeba histolytica NOT DETECTED NOT DETECTED Final   Giardia lamblia NOT DETECTED NOT DETECTED Final   Adenovirus F40/41 NOT DETECTED NOT DETECTED Final   Astrovirus NOT DETECTED NOT DETECTED Final   Norovirus GI/GII NOT DETECTED NOT DETECTED Final   Rotavirus A NOT DETECTED NOT DETECTED Final   Sapovirus (I, II, IV, and V) NOT DETECTED NOT DETECTED Final    Comment: Performed at [facility], [address], [place], [place] [address]    Procedures/Studies: DG Chest 1 View  Result Date: 12/01/2021 CLINICAL DATA:  Evaluate central line placement EXAM: CHEST  1 VIEW COMPARISON:  Nov 30, 2021 FINDINGS: The double lumen left central line terminates near the brachiocephalic confluence, stable. No pneumothorax. No improving left pulmonary infiltrate. No other interval changes. IMPRESSION: 1. The left central line is stable with the distal  tip terminating near the brachiocephalic confluence. 2. Significant improvement in left-sided infiltrate. Electronically Signed   By: Dorise Bullion III M.D.   On: 12/01/2021 15:37   CT HEAD WO CONTRAST (5MM)  Result Date: 11/29/2021 CLINICAL DATA:  Fall EXAM: CT HEAD WITHOUT CONTRAST CT CERVICAL SPINE WITHOUT CONTRAST TECHNIQUE: Multidetector CT imaging of the head and cervical spine was performed following the standard protocol without intravenous contrast. Multiplanar CT image reconstructions of the cervical spine were also generated. RADIATION DOSE REDUCTION: This exam was performed according to the departmental dose-optimization program which includes automated exposure control, adjustment of the mA and/or kV according to patient size and/or use of iterative reconstruction technique. COMPARISON:  CT head 05/16/2020, no prior cervical spine CT. FINDINGS: CT HEAD FINDINGS Brain: No evidence of acute infarction, hemorrhage, cerebral edema, mass, mass effect, or midline shift. No hydrocephalus or extra-axial fluid collection. Vascular: No hyperdense vessel. Skull: Normal. Negative for fracture or focal lesion. Sinuses/Orbits: Sequela of chronic right maxillary sinusitis and prior right maxillary antrostomy and right ethmoidectomy, with partial opacification of the right ethmoid air cells. The remaining paranasal sinuses are clear. The orbits are unremarkable. Other: The mastoid air cells are well aerated. CT CERVICAL SPINE FINDINGS Alignment: No listhesis. Skull base and vertebrae: No acute fracture. No primary bone lesion or focal pathologic process. Soft tissues and spinal canal: No prevertebral fluid or swelling. No visible canal hematoma. Disc levels: No high-grade spinal canal stenosis. Uncovertebral and facet arthropathy causes severe left neural foraminal narrowing at C3-C4 and moderate neural foraminal narrowing on the right at C3-C4 and on the left at C4-C5. Upper chest: Patchy opacities in the  peripheral left upper lobe (series 3, image 97. Other: None. IMPRESSION: 1.  No acute intracranial process. 2.  No acute fracture or traumatic listhesis in the cervical spine. 3. Patchy opacities in the peripheral left upper lobe, which are nonspecific but could be infectious or inflammatory. Consider CT chest for further evaluation if clinically indicated. Electronically Signed   By: Merilyn Baba M.D.   On: 11/29/2021 19:13   CT Cervical Spine Wo Contrast  Result Date: 11/29/2021 CLINICAL DATA:  Fall EXAM: CT HEAD WITHOUT CONTRAST CT CERVICAL SPINE WITHOUT CONTRAST TECHNIQUE: Multidetector CT imaging of the head and cervical spine was performed following the standard protocol without intravenous contrast. Multiplanar CT image reconstructions of the cervical spine were also generated. RADIATION DOSE REDUCTION: This exam was performed according to the departmental dose-optimization program which includes  automated exposure control, adjustment of the mA and/or kV according to patient size and/or use of iterative reconstruction technique. COMPARISON:  CT head 05/16/2020, no prior cervical spine CT. FINDINGS: CT HEAD FINDINGS Brain: No evidence of acute infarction, hemorrhage, cerebral edema, mass, mass effect, or midline shift. No hydrocephalus or extra-axial fluid collection. Vascular: No hyperdense vessel. Skull: Normal. Negative for fracture or focal lesion. Sinuses/Orbits: Sequela of chronic right maxillary sinusitis and prior right maxillary antrostomy and right ethmoidectomy, with partial opacification of the right ethmoid air cells. The remaining paranasal sinuses are clear. The orbits are unremarkable. Other: The mastoid air cells are well aerated. CT CERVICAL SPINE FINDINGS Alignment: No listhesis. Skull base and vertebrae: No acute fracture. No primary bone lesion or focal pathologic process. Soft tissues and spinal canal: No prevertebral fluid or swelling. No visible canal hematoma. Disc levels: No  high-grade spinal canal stenosis. Uncovertebral and facet arthropathy causes severe left neural foraminal narrowing at C3-C4 and moderate neural foraminal narrowing on the right at C3-C4 and on the left at C4-C5. Upper chest: Patchy opacities in the peripheral left upper lobe (series 3, image 97. Other: None. IMPRESSION: 1.  No acute intracranial process. 2.  No acute fracture or traumatic listhesis in the cervical spine. 3. Patchy opacities in the peripheral left upper lobe, which are nonspecific but could be infectious or inflammatory. Consider CT chest for further evaluation if clinically indicated. Electronically Signed   By: Merilyn Baba M.D.   On: 11/29/2021 19:13   MR BRAIN WO CONTRAST  Result Date: 12/09/2021 CLINICAL DATA:  No status change of unknown cause EXAM: MRI HEAD WITHOUT CONTRAST TECHNIQUE: Multiplanar, multiecho pulse sequences of the brain and surrounding structures were obtained without intravenous contrast. COMPARISON:  Head CT 10 days ago FINDINGS: Brain: No acute infarction, hemorrhage, hydrocephalus, extra-axial collection or mass lesion. Confluent FLAIR hyperintensity in the deep cerebral white matter, presumed chronic microvascular ischemia. Mild chronic white matter disease seen in the brainstem. Age normal brain volume. No focal cortical finding to explain seizure. Symmetric preserved hippocampal architecture. Vascular: Major flow voids are preserved Skull and upper cervical spine: Normal marrow signal. Sinuses/Orbits: Postoperative right maxillary sinus. Minimal mastoid opacification. Other: Occipital scalp lipoma, incidental. IMPRESSION: 1. No acute finding.  No focal cortical abnormality. 2. Chronic small vessel ischemia which is extensive in the cerebral white matter. Electronically Signed   By: Jorje Guild M.D.   On: 12/09/2021 05:33   US Renal  Result Date: 11/29/2021 CLINICAL DATA:  Elevated creatinine EXAM: RENAL / URINARY TRACT ULTRASOUND COMPLETE COMPARISON:   06/23/2018 FINDINGS: Right Kidney: Renal measurements: 12.4 x 7.2 x 7.3 cm. = volume: 340 mL. Mild increased echogenicity is noted. No mass or hydronephrosis is noted. Left Kidney: Renal measurements: 11.5 x 5.1 x 5.7 cm. = volume: 174 mL. Mild increased echogenicity is noted. No mass or hydronephrosis is noted. Bladder: Appears normal for degree of bladder distention. Other: None. IMPRESSION: Mild increased echogenicity consistent with medical renal disease. No other focal abnormality is noted. Electronically Signed   By: Inez Catalina M.D.   On: 11/29/2021 21:21   DG CHEST PORT 1 VIEW  Result Date: 12/06/2021 CLINICAL DATA:  Aspiration pneumonia EXAM: PORTABLE CHEST 1 VIEW COMPARISON:  12/01/2021 FINDINGS: Stable elevation the right hemidiaphragm. Similar lung aeration. Some residual patchy density in the left lung. Minimal atelectasis/scarring right lung base. Stable cardiomediastinal contours. IMPRESSION: Similar lung aeration. Electronically Signed   By: Macy Mis M.D.   On: 12/06/2021 08:28   DG  Chest Port 1 View  Result Date: 11/30/2021 CLINICAL DATA:  Central line placement. EXAM: PORTABLE CHEST 1 VIEW COMPARISON:  None Available. FINDINGS: The heart size and mediastinal contours are within normal limits. Mild-to-moderate interstitial and airspace opacities are seen diffusely throughout the left breast. Mild right basilar interstitial and airspace opacities are noted. There is no pleural effusion or pneumothorax. The visualized skeletal structures are unremarkable. A left internal jugular central venous catheter tip overlies the expected location of the confluence of the left brachiocephalic vein and superior vena cava. IMPRESSION: 1. Left internal jugular central venous catheter tip overlies the expected location of the confluence of the left brachiocephalic vein and superior vena cava. No pneumothorax. 2. Mild-to-moderate left and mild right interstitial and airspace opacities may represent  pulmonary edema and/or pneumonia. Electronically Signed   By: Zerita Boers M.D.   On: 11/30/2021 17:40   DG Abd 2 Views  Result Date: 11/30/2021 CLINICAL DATA:  66 year old female with abdominal pain. EXAM: ABDOMEN - 2 VIEW COMPARISON:  None Available. FINDINGS: Stool and gas in the colon and rectum noted. Nondistended gas-filled loops of small bowel are present. No dilated bowel loops are noted to suggest bowel obstruction. There is no evidence of pneumoperitoneum. No suspicious calcifications are identified. IMPRESSION: 1. Nonspecific nonobstructive bowel gas pattern. No evidence of pneumoperitoneum. 2. No evidence of suspicious calcifications. Electronically Signed   By: Margarette Canada M.D.   On: 11/30/2021 10:48   DG Abd Portable 1V  Result Date: 12/10/2021 CLINICAL DATA:  Feeding tube placement EXAM: PORTABLE ABDOMEN - 1 VIEW COMPARISON:  12/07/2021 FINDINGS: NG tube has been removed. Feeding tube has been placed with the tip in the gastric antrum. Normal bowel gas pattern IMPRESSION: Feeding tube tip in the gastric antrum. Electronically Signed   By: Franchot Gallo M.D.   On: 12/10/2021 10:50   DG Abd Portable 1V  Result Date: 12/07/2021 CLINICAL DATA:  Enteric catheter placement EXAM: PORTABLE ABDOMEN - 1 VIEW COMPARISON:  11/30/2021 FINDINGS: Frontal view of the lower chest and upper abdomen demonstrates enteric catheter passing below diaphragm, tip projecting over the region the proximal duodenum. Bowel gas pattern is unremarkable. No acute bony abnormalities. IMPRESSION: 1. Enteric catheter tip projecting over the proximal duodenum. Electronically Signed   By: Randa Ngo M.D.   On: 12/07/2021 16:02   EEG adult  Result Date: 12/05/2021 Derek Jack, MD     12/05/2021  7:08 PM Routine EEG Report Lyndee Anne-Marie Genson is a 66 y.o. female with a history of altered mental status and twitching who is undergoing an EEG to evaluate for seizures. Report: This EEG was acquired with electrodes  placed according to the International 10-20 electrode system (including Fp1, Fp2, F3, F4, C3, C4, P3, P4, O1, O2, T3, T4, T5, T6, A1, A2, Fz, Cz, Pz). The following electrodes were missing or displaced: none. 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 This activity was diffuse, at times more predominant on the left. There was no focal slowing. Photic stimulation and hyperventilation were not performed. No sleep architecture was identified. Patient was noted on video to have near continuous mouth twitching and intermittent twitches left and right body (not simultaneous) during the recording. 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 Patient will be administered  IV ativan and keppra and transferred to [facility] for cEEG. Su Monks, MD [facility] [phone] If 7pm- 7am, please page neurology on call as listed in [place].   Overnight EEG with video  Result Date: 12/14/2021 Lora Havens, MD     12/14/2021  9:09 AM Patient Name: Cathy Crounse MRN: [id number] Epilepsy Attending: Lora Havens Referring Physician/Provider: Lorenza Chick, MD Duration: 12/13/2021 1138 to 12/14/2021  0741  Patient history: 66 year old woman with past medical history significant for chronic pain on chronic opiates, chronic benzodiazepine use, fibromyalgia, hyperlipidemia.  She has presented with acute renal failure of unknown etiology now with nonconvulsive status epilepticus.  EEG to evaluate for seizure.  Level of alertness: awake, asleep  AEDs during EEG study: Keppra, Vimpat, Xanax  Technical aspects: This EEG study was done with scalp electrodes positioned according to the  10-20 International system of electrode placement. Electrical activity was acquired at a sampling rate of 500Hz  and reviewed with a high frequency filter of 70Hz  and a low frequency filter of 1Hz . EEG data were recorded continuously and digitally stored.  Description: The posterior dominant rhythm consists of 7.5 Hz activity of moderate voltage (25-35 uV) seen predominantly in posterior head regions, symmetric and reactive to eye opening and eye closing. Sleep was characterized by vertex waves, sleep spindles (12 to 14 Hz), maximal frontocentral region. EEG showed Intermittent generalized 3-6Hz  theta- delta slowing. Hyperventilation and photic stimulation were not performed.     ABNORMALITY - Intermittent slow, generalized  IMPRESSION: This study is suggestive of mild to moderate diffuse encephalopathy, nonspecific to etiology. No seizures or epileptiform discharges were seen throughout the recording.  Priyanka Barbra Sarks   Overnight EEG with video  Result Date: 12/10/2021 Lora Havens, MD     12/11/2021  8:59 AM Patient Name: Hiilani Jetter MRN: [id number] Epilepsy Attending: Lora Havens Referring Physician/Provider: Lorenza Chick, MD Duration: 12/09/2021 1301 to 12/10/2021 1301  Patient history: 66 year old woman with past medical history significant for chronic pain on chronic opiates, chronic benzodiazepine use, fibromyalgia, hyperlipidemia.  She has presented with acute renal failure of unknown etiology now with nonconvulsive status epilepticus.  EEG to evaluate for seizure.  Level of alertness:  lethargic  AEDs during EEG study: Keppra, Vimpat, Xanax  Technical aspects: This EEG study was done with scalp electrodes positioned according to the 10-20 International system of electrode placement. Electrical activity was acquired at a sampling rate of 500Hz  and reviewed with a high frequency filter of 70Hz  and a low frequency filter of 1Hz . EEG data were recorded continuously and digitally stored.   Description: No clear posterior dominant rhythm was seen.  EEG showed continuous generalized polymorphic sharply contoured predominantly 5 to 8 Hz theta and alpha activity admixed with intermittent generalized 2 to 3 Hz delta slowing.  Intermittent 12 to 15 Hz sharply contoured polymorphic beta activity was noted in the vertex region seen more commonly when patient was stimulated. Hyperventilation and photic stimulation were not performed.    ABNORMALITY - Continuous slow, generalized  IMPRESSION: This study is suggestive of severe diffuse encephalopathy, nonspecific to etiology. No seizures or epileptiform discharges were seen throughout the recording.  Priyanka Barbra Sarks   Overnight EEG with video  Result Date: 12/06/2021 Lora Havens, MD     12/07/2021  8:27 AM Patient Name: Ermalinda Joubert MRN: [id number] Epilepsy Attending: Lora Havens Referring Physician/Provider: Lorenza Chick, MD Duration: 12/05/2021 2301 to 12/06/2021 2301 Patient history: 66 year old woman with  past medical history significant for chronic pain on chronic opiates, chronic benzodiazepine use, fibromyalgia, hyperlipidemia.  She has presented with acute renal failure of unknown etiology now with nonconvulsive status epilepticus.  EEG to evaluate for seizure. Level of alertness:  lethargic AEDs during EEG study: Keppra, Vimpat, Depakote, Ativan Technical aspects: This EEG study was done with scalp electrodes positioned according to the 10-20 International system of electrode placement. Electrical activity was acquired at a sampling rate of 500Hz  and reviewed with a high frequency filter of 70Hz  and a low frequency filter of 1Hz . EEG data were recorded continuously and digitally stored. Description: No clear posterior dominant rhythm was seen.  EEG showed continuous generalized polymorphic sharply contoured predominantly 5 to 8 Hz theta and alpha activity admixed with intermittent generalized 2 to 3 Hz delta slowing.  Patient  event button was pressed on 12/06/2021 at 1103 during which patient had subtle jerking, alteration go awareness per RN. Concomitant eeg before, during and after the event didn't show any eeg change to suggest seizure. Patient was noted to have subtle whole body jerking, more prominent when awake/stimulated. Concomitant eeg showed high amplitude sharply contoured 3-6hz  theta-delta slowing without definite evolution. Hyperventilation and photic stimulation were not performed.   ABNORMALITY - Continuous slow, generalized IMPRESSION: This study is suggestive of severe diffuse encephalopathy, nonspecific to etiology. No seizures or epileptiform discharges were seen throughout the recording. Patient was noted to have subtle whole body jerks, more prominent when awake/stimulated without concomitant eeg change. These episodes were most likely not epileptic. Stimulation induced myoclonus could have similar appearance. Priyanka Barbra Sarks   CT CHEST ABDOMEN PELVIS WO CONTRAST  Result Date: 12/09/2021 CLINICAL DATA:  Sepsis EXAM: CT CHEST, ABDOMEN AND PELVIS WITHOUT CONTRAST TECHNIQUE: Multidetector CT imaging of the chest, abdomen and pelvis was performed following the standard protocol without IV contrast. Examination performed without IV contrast at organ physician's request. Evaluation of the vascular structures as well as the solid abdominopelvic viscera is severely limited. RADIATION DOSE REDUCTION: This exam was performed according to the departmental dose-optimization program which includes automated exposure control, adjustment of the mA and/or kV according to patient size and/or use of iterative reconstruction technique. COMPARISON:  10/06/2021, 09/18/2014 FINDINGS: CT CHEST FINDINGS Cardiovascular: Unenhanced imaging of the heart demonstrates no pericardial effusion. Normal caliber of the thoracic aorta, with atherosclerosis of the aortic arch and descending thoracic aorta. Left internal jugular catheter tip within  the left brachiocephalic vein. Mediastinum/Nodes: Right-sided isodense nodule lower pole right lobe thyroid measures 1.9 cm. No pathologic adenopathy. The trachea is grossly normal. Enteric catheter is seen passing below diaphragm, tip within the second portion duodenum. Esophagus is otherwise unremarkable. Lungs/Pleura: Trace bilateral pleural effusions with dependent lower lobe atelectasis, right greater than left. No airspace disease or pneumothorax. Central airways are patent. Musculoskeletal: No acute or destructive bony lesions. Reconstructed images demonstrate no additional findings. CT ABDOMEN PELVIS FINDINGS Hepatobiliary: Unremarkable unenhanced appearance of the liver and gallbladder. Pancreas: Unremarkable unenhanced appearance. Spleen: Unremarkable unenhanced appearance. Adrenals/Urinary Tract: No urinary tract calculi or obstructive uropathy within either kidney. The adrenals and bladder are grossly normal. Stomach/Bowel: No bowel obstruction or ileus. Normal appendix within the central lower abdomen. No bowel wall thickening or inflammatory change. Enteric catheter tip within the second portion duodenum. Vascular/Lymphatic: Aortic atherosclerosis. No enlarged abdominal or pelvic lymph nodes. Reproductive: Status post hysterectomy. No adnexal masses. Other: No free fluid or free intraperitoneal gas. No abdominal wall hernia. Musculoskeletal: No acute or destructive bony lesions. Reconstructed images demonstrate no  additional findings. IMPRESSION: 1. Trace bilateral pleural effusions and dependent lower lobe atelectasis. No acute airspace disease. 2. No acute intra-abdominal or intrapelvic process. 3. 1.9 cm incidental right thyroid nodule. Recommend nonemergent follow-up outpatient thyroid ultrasound if not previously evaluated. Reference: J Am Coll Radiol. 2015 Feb;12(2): 143-50 4.  Aortic Atherosclerosis (ICD10-I70.0). Electronically Signed   By: Randa Ngo M.D.   On: 12/09/2021 15:56     Time  coordinating discharge: Over 30 minutes    Dwyane Dee, MD  [facility] 12/18/2021, 10:36 AM

## 2021-12-18 NOTE — Progress Notes (Addendum)
Patient's insurance authorization has been approved.  Patient will be transported via [facility] - pickup time 11am. Patient will go to room 802. The number to call for report is [phone].   MD and RN aware of discharge plan.  Madilyn Fireman, MSW, LCSW Transitions of Care  Clinical Social Worker II [phone]

## 2021-12-18 NOTE — Progress Notes (Signed)
Carla Gomez to be D/C'd  per MD order.  Discussed with [facility] and all questions fully answered.  VSS, Skin clean, dry and intact without evidence of skin break down, no evidence of skin tears noted.  IV catheter discontinued intact. Site without signs and symptoms of complications. Dressing and pressure applied.  An After Visit Summary was printed and given to the [place].  Patient instructed to return to ED, call 911, or call MD for any changes in condition.

## 2022-02-26 ENCOUNTER — Other Ambulatory Visit: Payer: Self-pay | Admitting: Internal Medicine

## 2022-02-26 DIAGNOSIS — Z1231 Encounter for screening mammogram for malignant neoplasm of breast: Secondary | ICD-10-CM

## 2022-07-03 ENCOUNTER — Ambulatory Visit
Admission: RE | Admit: 2022-07-03 | Discharge: 2022-07-03 | Disposition: A | Discharge status: Home / Self Care | Payer: Medicare Other | Source: Ambulatory Visit | Attending: Internal Medicine | Admitting: Internal Medicine

## 2022-07-03 DIAGNOSIS — Z1231 Encounter for screening mammogram for malignant neoplasm of breast: Secondary | ICD-10-CM | POA: Insufficient documentation

## 2022-09-05 ENCOUNTER — Other Ambulatory Visit: Payer: Self-pay

## 2022-09-05 DIAGNOSIS — R1031 Right lower quadrant pain: Secondary | ICD-10-CM

## 2022-09-16 ENCOUNTER — Ambulatory Visit
Admission: RE | Admit: 2022-09-16 | Discharge: 2022-09-16 | Disposition: A | Discharge status: Home / Self Care | Payer: Medicare Other | Source: Ambulatory Visit | Attending: Internal Medicine | Admitting: Internal Medicine

## 2022-09-16 DIAGNOSIS — R1031 Right lower quadrant pain: Secondary | ICD-10-CM | POA: Insufficient documentation

## 2022-09-16 DIAGNOSIS — R1032 Left lower quadrant pain: Secondary | ICD-10-CM | POA: Insufficient documentation

## 2022-09-16 MED ORDER — IOHEXOL 300 MG/ML  SOLN
100.0000 mL | Freq: Once | INTRAMUSCULAR | Status: AC | PRN
  Administered 2022-09-16: 100 mL via INTRAVENOUS

## 2022-11-25 ENCOUNTER — Telehealth: Payer: Self-pay

## 2022-11-25 NOTE — Telephone Encounter (Signed)
Left message for patient to call office back to schedule annual appt 

## 2023-02-06 ENCOUNTER — Encounter: Payer: Self-pay | Admitting: *Deleted

## 2023-02-09 ENCOUNTER — Ambulatory Visit: Payer: Medicare Other | Admitting: Anesthesiology

## 2023-02-09 ENCOUNTER — Other Ambulatory Visit: Payer: Self-pay

## 2023-02-09 ENCOUNTER — Encounter
Admission: RE | Disposition: A | Discharge status: Home / Self Care | Payer: Self-pay | Source: Home / Self Care | Attending: Gastroenterology

## 2023-02-09 ENCOUNTER — Ambulatory Visit
Admission: RE | Admit: 2023-02-09 | Discharge: 2023-02-09 | Disposition: A | Discharge status: Home / Self Care | Payer: Medicare Other | Source: Home / Self Care | Attending: Gastroenterology | Admitting: Gastroenterology

## 2023-02-09 ENCOUNTER — Encounter: Payer: Self-pay | Admitting: *Deleted

## 2023-02-09 DIAGNOSIS — K3189 Other diseases of stomach and duodenum: Secondary | ICD-10-CM | POA: Insufficient documentation

## 2023-02-09 DIAGNOSIS — D123 Benign neoplasm of transverse colon: Secondary | ICD-10-CM | POA: Insufficient documentation

## 2023-02-09 DIAGNOSIS — Z79899 Other long term (current) drug therapy: Secondary | ICD-10-CM | POA: Diagnosis not present

## 2023-02-09 DIAGNOSIS — I10 Essential (primary) hypertension: Secondary | ICD-10-CM | POA: Diagnosis not present

## 2023-02-09 DIAGNOSIS — G8929 Other chronic pain: Secondary | ICD-10-CM | POA: Diagnosis not present

## 2023-02-09 DIAGNOSIS — K2950 Unspecified chronic gastritis without bleeding: Secondary | ICD-10-CM | POA: Insufficient documentation

## 2023-02-09 DIAGNOSIS — K640 First degree hemorrhoids: Secondary | ICD-10-CM | POA: Insufficient documentation

## 2023-02-09 DIAGNOSIS — R933 Abnormal findings on diagnostic imaging of other parts of digestive tract: Secondary | ICD-10-CM | POA: Insufficient documentation

## 2023-02-09 DIAGNOSIS — R1084 Generalized abdominal pain: Secondary | ICD-10-CM | POA: Insufficient documentation

## 2023-02-09 DIAGNOSIS — F419 Anxiety disorder, unspecified: Secondary | ICD-10-CM | POA: Diagnosis not present

## 2023-02-09 DIAGNOSIS — M797 Fibromyalgia: Secondary | ICD-10-CM | POA: Diagnosis not present

## 2023-02-09 HISTORY — PX: COLONOSCOPY WITH PROPOFOL: SHX5780

## 2023-02-09 HISTORY — PX: POLYPECTOMY: SHX5525

## 2023-02-09 HISTORY — PX: ESOPHAGOGASTRODUODENOSCOPY (EGD) WITH PROPOFOL: SHX5813

## 2023-02-09 HISTORY — PX: BIOPSY: SHX5522

## 2023-02-09 SURGERY — COLONOSCOPY WITH PROPOFOL
Anesthesia: General

## 2023-02-09 MED ORDER — LIDOCAINE HCL (PF) 2 % IJ SOLN
INTRAMUSCULAR | Status: AC
  Filled 2023-02-09: qty 5

## 2023-02-09 MED ORDER — PROPOFOL 10 MG/ML IV BOLUS
INTRAVENOUS | Status: AC
  Filled 2023-02-09: qty 40

## 2023-02-09 MED ORDER — PROPOFOL 500 MG/50ML IV EMUL
INTRAVENOUS | Status: DC | PRN
  Administered 2023-02-09: 100 ug/kg/min via INTRAVENOUS

## 2023-02-09 MED ORDER — PROPOFOL 10 MG/ML IV BOLUS
INTRAVENOUS | Status: DC | PRN
  Administered 2023-02-09: 20 mg via INTRAVENOUS
  Administered 2023-02-09: 80 mg via INTRAVENOUS

## 2023-02-09 MED ORDER — SODIUM CHLORIDE 0.9 % IV SOLN: INTRAVENOUS | Status: DC

## 2023-02-09 MED ORDER — LIDOCAINE HCL (CARDIAC) PF 100 MG/5ML IV SOSY
PREFILLED_SYRINGE | INTRAVENOUS | Status: DC | PRN
  Administered 2023-02-09: 100 mg via INTRAVENOUS

## 2023-02-09 NOTE — Transfer of Care (Signed)
Immediate Anesthesia Transfer of Care Note  Patient: Carla Gomez  Procedure(s) Performed: COLONOSCOPY WITH PROPOFOL ESOPHAGOGASTRODUODENOSCOPY (EGD) WITH PROPOFOL BIOPSY POLYPECTOMY  Patient Location: PACU  Anesthesia Type:MAC  Level of Consciousness: awake  Airway & Oxygen Therapy: Patient Spontanous Breathing  Post-op Assessment: Report given to RN and Post -op Vital signs reviewed and stable  Post vital signs: Reviewed and stable  Last Vitals:  Vitals Value Taken Time  BP 107/73 02/09/23 0814  Temp 36.1 C 02/09/23 0814  Pulse 90 02/09/23 0814  Resp 12 02/09/23 0814  SpO2 99 % 02/09/23 0814  Vitals shown include unfiled device data.  Last Pain:  Vitals:   02/09/23 0814  TempSrc: Temporal  PainSc: 0-No pain         Complications: No notable events documented.

## 2023-02-09 NOTE — Anesthesia Preprocedure Evaluation (Signed)
Anesthesia Evaluation  Patient identified by MRN, date of birth, ID band Patient awake    Reviewed: Allergy & Precautions, NPO status , Patient's Chart, lab work & pertinent test results  Airway Mallampati: II  TM Distance: >3 FB Neck ROM: full    Dental  (+) Teeth Intact   Pulmonary neg pulmonary ROS   Pulmonary exam normal breath sounds clear to auscultation       Cardiovascular Exercise Tolerance: Good hypertension, Pt. on medications negative cardio ROS Normal cardiovascular exam Rhythm:Regular Rate:Normal     Neuro/Psych   Anxiety     Hx of metabolic encephalopathy, no longer on Kepra negative neurological ROS  negative psych ROS   GI/Hepatic negative GI ROS, Neg liver ROS,,,  Endo/Other  negative endocrine ROS    Renal/GU negative Renal ROS  negative genitourinary   Musculoskeletal  (+)  Fibromyalgia -  Abdominal Normal abdominal exam  (+)   Peds negative pediatric ROS (+)  Hematology negative hematology ROS (+) Blood dyscrasia, anemia   Anesthesia Other Findings Past Medical History: No date: Cancer (HCC)     Comment:  basal cell No date: Chronic fatigue No date: Chronic pain No date: Dental bridge present     Comment:  permanent bottom right No date: Fibromyalgia No date: Hypertension No date: Migraine headache  Past Surgical History: No date: ABDOMINAL HYSTERECTOMY 09/26/14: BREAST BIOPSY; Left     Comment:  lt bx/clip- benign 11/02/14: BREAST BIOPSY; Left     Comment:  benign 12/21/2020: COLONOSCOPY WITH PROPOFOL; N/A     Comment:  Procedure: COLONOSCOPY WITH PROPOFOL;  Surgeon: Midge Minium, MD;  Location: [facility];  Service:               Endoscopy;  Laterality: N/A; No date: NASAL SINUS SURGERY  BMI    Body Mass Index: 25.75 kg/m      Reproductive/Obstetrics negative OB ROS                             Anesthesia Physical Anesthesia  Plan  ASA: 2  Anesthesia Plan: General   Post-op Pain Management:    Induction: Intravenous  PONV Risk Score and Plan: Propofol infusion and TIVA  Airway Management Planned: Natural Airway  Additional Equipment:   Intra-op Plan:   Post-operative Plan:   Informed Consent: I have reviewed the patients History and Physical, chart, labs and discussed the procedure including the risks, benefits and alternatives for the proposed anesthesia with the patient or authorized representative who has indicated his/her understanding and acceptance.     Dental Advisory Given  Plan Discussed with: CRNA and Surgeon  Anesthesia Plan Comments:        Anesthesia Quick Evaluation

## 2023-02-09 NOTE — Telephone Encounter (Signed)
As of 02/09/2023 pt had not contacted office to schedule annual exam.

## 2023-02-09 NOTE — Anesthesia Postprocedure Evaluation (Signed)
Anesthesia Post Note  Patient: Wellsite geologist  Procedure(s) Performed: COLONOSCOPY WITH PROPOFOL ESOPHAGOGASTRODUODENOSCOPY (EGD) WITH PROPOFOL BIOPSY POLYPECTOMY  Patient location during evaluation: PACU Anesthesia Type: General Level of consciousness: awake and oriented Pain management: satisfactory to patient Vital Signs Assessment: post-procedure vital signs reviewed and stable Respiratory status: spontaneous breathing Cardiovascular status: blood pressure returned to baseline Anesthetic complications: no   No notable events documented.   Last Vitals:  Vitals:   02/09/23 0703 02/09/23 0814  BP: 137/76 107/73  Pulse: 98 89  Resp: 16 12  Temp: (!) 36.4 C (!) 36.1 C  SpO2: 98% 99%    Last Pain:  Vitals:   02/09/23 0834  TempSrc:   PainSc: 0-No pain                 VAN STAVEREN,Aisa Schoeppner

## 2023-02-09 NOTE — Op Note (Signed)
[facility] [facility] Patient Name: Carla Gomez Procedure Date: 02/09/2023 7:33 AM MRN: [id number] Account #: [id number] Date of Birth: 05/16/1956 Admit Type: Outpatient Age: 67 Room: [facility] ENDO ROOM 3 Gender: Female Note Status: Finalized Instrument Name: Prentice Docker [id number] Procedure:             Colonoscopy Indications:           Abnormal CT of the GI tract Providers:             Eather Colas MD, MD Referring MD:          Duane Lope. Judithann Sheen, MD (Referring MD) Medicines:             Monitored Anesthesia Care Complications:         No immediate complications. Estimated blood loss:                         Minimal. Procedure:             Pre-Anesthesia Assessment:                        - Prior to the procedure, a History and Physical was                         performed, and patient medications and allergies were                         reviewed. The patient is competent. The risks and                         benefits of the procedure and the sedation options and                         risks were discussed with the patient. All questions                         were answered and informed consent was obtained.                         Patient identification and proposed procedure were                         verified by the physician, the nurse, the                         anesthesiologist, the anesthetist and the technician                         in the endoscopy suite. Mental Status Examination:                         alert and oriented. Airway Examination: normal                         oropharyngeal airway and neck mobility. Respiratory                         Examination: clear to auscultation. CV Examination:  normal. Prophylactic Antibiotics: The patient does not                         require prophylactic antibiotics. Prior                         Anticoagulants: The patient has taken no anticoagulant                          or antiplatelet agents. ASA Grade Assessment: II - A                         patient with mild systemic disease. After reviewing                         the risks and benefits, the patient was deemed in                         satisfactory condition to undergo the procedure. The                         anesthesia plan was to use monitored anesthesia care                         (MAC). Immediately prior to administration of                         medications, the patient was re-assessed for adequacy                         to receive sedatives. The heart rate, respiratory                         rate, oxygen saturations, blood pressure, adequacy of                         pulmonary ventilation, and response to care were                         monitored throughout the procedure. The physical                         status of the patient was re-assessed after the                         procedure.                        After obtaining informed consent, the colonoscope was                         passed under direct vision. Throughout the procedure,                         the patient's blood pressure, pulse, and oxygen                         saturations were monitored continuously. The  Colonoscope was introduced through the anus and                         advanced to the the ileocecal valve. The colonoscopy                         was performed without difficulty. The patient                         tolerated the procedure well. The quality of the bowel                         preparation was inadequate. The ileocecal valve and                         the rectum were photographed. Findings:      The perianal and digital rectal examinations were normal.      Solid stool was found in the cecum, precluding visualization.      A 7 mm polyp was found in the transverse colon. The polyp was sessile.       The polyp was removed with a cold snare. Resection and  retrieval were       complete. Estimated blood loss was minimal.      Internal hemorrhoids were found during endoscopy. The hemorrhoids were       Grade I (internal hemorrhoids that do not prolapse). Impression:            - Preparation of the colon was inadequate.                        - Stool in the cecum.                        - One 7 mm polyp in the transverse colon, removed with                         a cold snare. Resected and retrieved.                        - Internal hemorrhoids. Recommendation:        - Repeat colonoscopy because the bowel preparation was                         suboptimal.                        - Return to referring physician as previously                         scheduled.                        - Discharge patient to home.                        - Resume previous diet.                        - Continue present medications.                        -  Await pathology results. Procedure Code(s):     --- Professional ---                        [phone], Colonoscopy, flexible; with removal of                         tumor(s), polyp(s), or other lesion(s) by snare                         technique Diagnosis Code(s):     --- Professional ---                        K64.0, First degree hemorrhoids                        D12.3, Benign neoplasm of transverse colon (hepatic                         flexure or splenic flexure)                        R93.3, Abnormal findings on diagnostic imaging of                         other parts of digestive tract CPT copyright 2022 American [employer]. All rights reserved. The codes documented in this report are preliminary and upon coder review may  be revised to meet current compliance requirements. Eather Colas MD, MD 02/09/2023 8:21:40 AM Number of Addenda: 0 Note Initiated On: 02/09/2023 7:33 AM Scope Withdrawal Time: 0 hours 3 minutes 51 seconds  Total Procedure Duration: 0 hours 9 minutes 31 seconds   Estimated Blood Loss:  Estimated blood loss was minimal.      [facility]

## 2023-02-09 NOTE — Interval H&P Note (Signed)
History and Physical Interval Note:  02/09/2023 7:45 AM  Carla Gomez  has presented today for surgery, with the diagnosis of Abnormal Ct Scan of GI tract,abdominal pain.  The various methods of treatment have been discussed with the patient and family. After consideration of risks, benefits and other options for treatment, the patient has consented to  Procedure(s): COLONOSCOPY WITH PROPOFOL (N/A) ESOPHAGOGASTRODUODENOSCOPY (EGD) WITH PROPOFOL (N/A) as a surgical intervention.  The patient's history has been reviewed, patient examined, no change in status, stable for surgery.  I have reviewed the patient's chart and labs.  Questions were answered to the patient's satisfaction.     Regis Bill  Ok to proceed with EGD/Colonoscopy

## 2023-02-09 NOTE — Op Note (Signed)
[facility] Patient Name: Carla Gomez Procedure Date: 02/09/2023 7:34 AM MRN: [id number] Account #: [id number] Date of Birth: June 30, 1956 Admit Type: Outpatient Age: 67 Room: [facility] ENDO ROOM 3 Gender: Female Note Status: Finalized Instrument Name: Patton Salles Endoscope [id number] Procedure:             Upper GI endoscopy Indications:           Generalized abdominal pain Providers:             Eather Colas MD, MD Referring MD:          Duane Lope. Judithann Sheen, MD (Referring MD) Medicines:             Monitored Anesthesia Care Complications:         No immediate complications. Estimated blood loss:                         Minimal. Procedure:             Pre-Anesthesia Assessment:                        - Prior to the procedure, a History and Physical was                         performed, and patient medications and allergies were                         reviewed. The patient is competent. The risks and                         benefits of the procedure and the sedation options and                         risks were discussed with the patient. All questions                         were answered and informed consent was obtained.                         Patient identification and proposed procedure were                         verified by the physician, the nurse, the                         anesthesiologist, the anesthetist and the technician                         in the endoscopy suite. Mental Status Examination:                         alert and oriented. Airway Examination: normal                         oropharyngeal airway and neck mobility. Respiratory                         Examination: clear to auscultation. CV Examination:  normal. Prophylactic Antibiotics: The patient does not                         require prophylactic antibiotics. Prior                         Anticoagulants: The patient has taken no anticoagulant                          or antiplatelet agents. ASA Grade Assessment: II - A                         patient with mild systemic disease. After reviewing                         the risks and benefits, the patient was deemed in                         satisfactory condition to undergo the procedure. The                         anesthesia plan was to use monitored anesthesia care                         (MAC). Immediately prior to administration of                         medications, the patient was re-assessed for adequacy                         to receive sedatives. The heart rate, respiratory                         rate, oxygen saturations, blood pressure, adequacy of                         pulmonary ventilation, and response to care were                         monitored throughout the procedure. The physical                         status of the patient was re-assessed after the                         procedure.                        After obtaining informed consent, the endoscope was                         passed under direct vision. Throughout the procedure,                         the patient's blood pressure, pulse, and oxygen                         saturations were monitored continuously. The Endoscope  was introduced through the mouth, and advanced to the                         second part of duodenum. The upper GI endoscopy was                         accomplished without difficulty. The patient tolerated                         the procedure well. Findings:      The examined esophagus was normal.      Patchy mild inflammation characterized by erythema was found in the       gastric antrum. Biopsies were taken with a cold forceps for Helicobacter       pylori testing. Estimated blood loss was minimal.      The examined duodenum was normal. Impression:            - Normal esophagus.                        - Gastritis. Biopsied.                        -  Normal examined duodenum. Recommendation:        - Discharge patient to home.                        - Resume previous diet.                        - Continue present medications.                        - Await pathology results.                        - Return to referring physician as previously                         scheduled. Procedure Code(s):     --- Professional ---                        [phone], Esophagogastroduodenoscopy, flexible,                         transoral; with biopsy, single or multiple Diagnosis Code(s):     --- Professional ---                        K29.70, Gastritis, unspecified, without bleeding                        R10.84, Generalized abdominal pain CPT copyright 2022 American Medical Association. All rights reserved. The codes documented in this report are preliminary and upon coder review may  be revised to meet current compliance requirements. Eather Colas MD, MD 02/09/2023 8:16:55 AM Number of Addenda: 0 Note Initiated On: 02/09/2023 7:34 AM Estimated Blood Loss:  Estimated blood loss was minimal.      [facility]

## 2023-02-09 NOTE — H&P (Signed)
Outpatient short stay form Pre-procedure 02/09/2023  Regis Bill, MD  Primary Physician: Marguarite Arbour, MD  Reason for visit:  Abdominal pain/abnormal imaging  History of present illness:    67 y/o lady with history of hypertension and chronic pain on narcotics here for EGD/Colonoscopy for abdominal pain and CT scan showing possible colitis. No blood thinners. History of laparoscopic surgery for ovarian cyst. No first degree relatives with GI malignancies.     Current Facility-Administered Medications:    0.9 %  sodium chloride infusion, , Intravenous, Continuous, Franky Reier, Rossie Muskrat, MD, Last Rate: 20 mL/hr at 02/09/23 0717, New Bag at 02/09/23 0717  Medications Prior to Admission  Medication Sig Dispense Refill Last Dose   amLODipine (NORVASC) 10 MG tablet Take 1 tablet (10 mg total) by mouth daily.   02/08/2023   Cholecalciferol (VITAMIN D) 50 MCG (2000 UT) CAPS Take 2,000 Units by mouth daily.   Past Week   clonazePAM (KLONOPIN) 0.5 MG tablet Take 1 tablet (0.5 mg total) by mouth 2 (two) times daily. (Patient taking differently: Take 0.5 mg by mouth 3 (three) times daily as needed for anxiety.) 10 tablet 0 02/08/2023   ezetimibe (ZETIA) 10 MG tablet Take 10 mg by mouth daily.   02/08/2023   morphine (MS CONTIN) 30 MG 12 hr tablet Take 30 mg by mouth every 12 (twelve) hours.   02/08/2023   Multiple Vitamin (MULTIVITAMIN WITH MINERALS) TABS tablet Take 1 tablet by mouth daily.   Past Week   nortriptyline (PAMELOR) 10 MG capsule Take 20 mg by mouth at bedtime.   02/08/2023   propranolol (INDERAL) 10 MG tablet Take 5 mg by mouth 2 (two) times daily.   02/08/2023 at 1700   QUEtiapine (SEROQUEL) 100 MG tablet Take 100 mg by mouth at bedtime.   02/08/2023   acetaminophen (TYLENOL) 325 MG tablet Take 2 tablets (650 mg total) by mouth every 6 (six) hours as needed for mild pain (or Fever >/= 101).      carvedilol (COREG) 6.25 MG tablet Take 1 tablet (6.25 mg total) by mouth 2 (two) times  daily with a meal. (Patient not taking: Reported on 02/02/2023)   Not Taking   levETIRAcetam (KEPPRA) 250 MG tablet Take 1 tablet (250 mg total) by mouth 2 (two) times daily. (Patient not taking: Reported on 02/02/2023)   Not Taking   polyethylene glycol (MIRALAX / GLYCOLAX) 17 g packet Take 17 g by mouth daily. 14 each 0    rosuvastatin (CRESTOR) 20 MG tablet Take 20 mg by mouth daily at 6 PM. (Patient not taking: Reported on 02/02/2023)   Not Taking   senna-docusate (SENOKOT-S) 8.6-50 MG tablet Take 1 tablet by mouth 2 (two) times daily.        Allergies  Allergen Reactions   Citrus Other (See Comments)    Triggers migraines   Latex Other (See Comments)    Redness around mouth after dental procedures   Other Other (See Comments)    ALLERGENS: Exposure to Inhalants such as grasses, pollen, trees, mold, mildew, cat dander, cows.  REACTION: post nasal drainage, runny nose and/or chronic sinus infections     Past Medical History:  Diagnosis Date   Cancer (HCC)    basal cell   Chronic fatigue    Chronic pain    Dental bridge present    permanent bottom right   Fibromyalgia    Hypertension    Migraine headache     Review of systems:  Otherwise negative.  Physical Exam  Gen: Alert, oriented. Appears stated age.  HEENT: PERRLA. Lungs: No respiratory distress CV: RRR Abd: soft, benign, no masses Ext: No edema    Planned procedures: Proceed with EGD/colonoscopy. The patient understands the nature of the planned procedure, indications, risks, alternatives and potential complications including but not limited to bleeding, infection, perforation, damage to internal organs and possible oversedation/side effects from anesthesia. The patient agrees and gives consent to proceed.  Please refer to procedure notes for findings, recommendations and patient disposition/instructions.     Regis Bill, MD [facility]

## 2023-02-10 ENCOUNTER — Encounter: Payer: Self-pay | Admitting: Gastroenterology

## 2023-04-23 ENCOUNTER — Other Ambulatory Visit: Payer: Self-pay | Admitting: Internal Medicine

## 2023-04-23 DIAGNOSIS — Z1231 Encounter for screening mammogram for malignant neoplasm of breast: Secondary | ICD-10-CM

## 2023-08-11 ENCOUNTER — Ambulatory Visit
Admission: RE | Admit: 2023-08-11 | Discharge: 2023-08-11 | Disposition: A | Discharge status: Home / Self Care | Payer: Medicare HMO | Source: Ambulatory Visit | Attending: Internal Medicine | Admitting: Internal Medicine

## 2023-08-11 DIAGNOSIS — Z1231 Encounter for screening mammogram for malignant neoplasm of breast: Secondary | ICD-10-CM | POA: Diagnosis present

## 2023-09-02 ENCOUNTER — Ambulatory Visit: Payer: Medicare HMO

## 2023-09-27 IMAGING — DX DG ABD PORTABLE 1V
1 series · 1 of 1 positions shown · non-contrast
Comparison: 12/07/2021

CLINICAL DATA: Feeding tube placement

EXAM:
PORTABLE ABDOMEN - 1 VIEW

[abdomen]
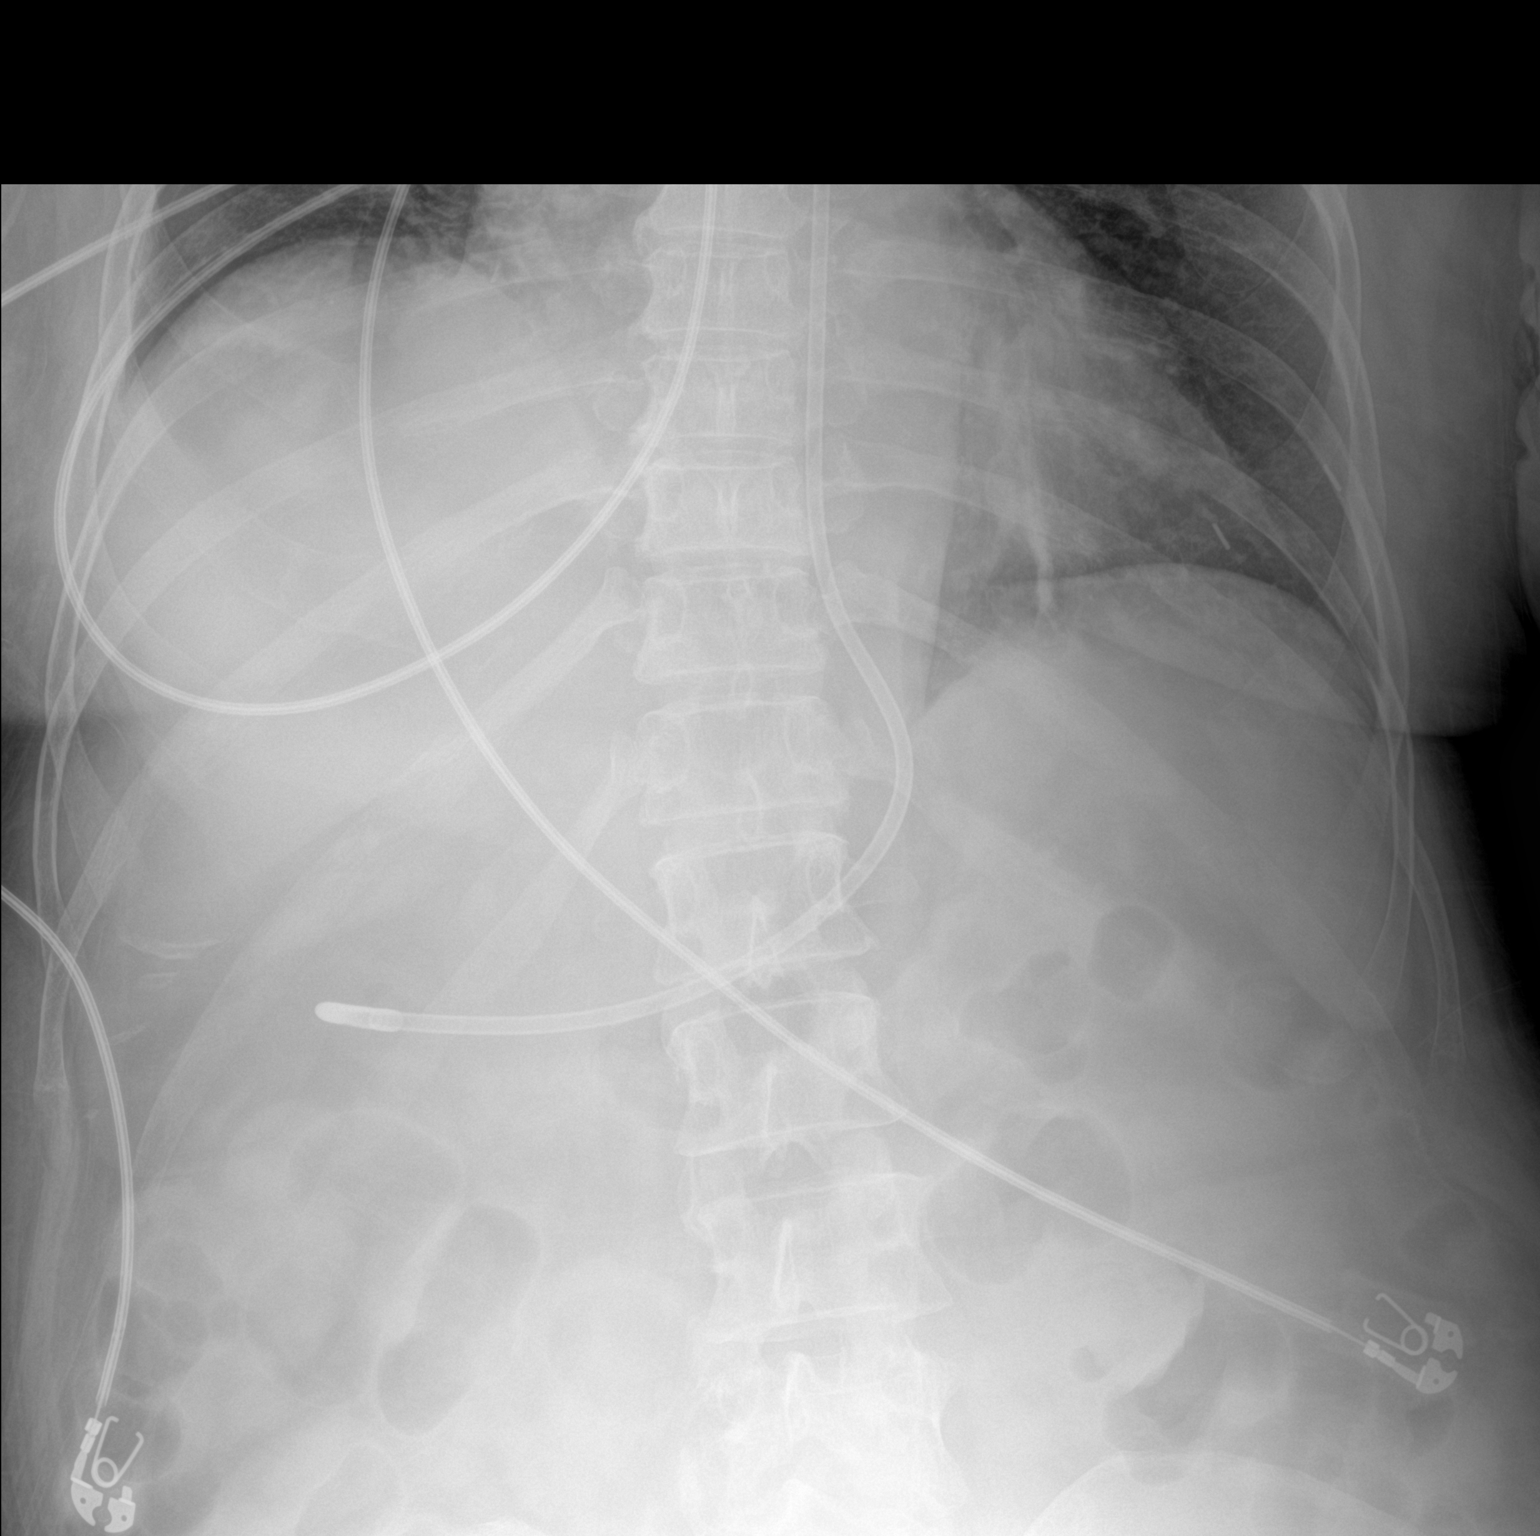

[1 of 1 positions shown; findings below may reference images not displayed]

FINDINGS: NG tube has been removed. Feeding tube has been placed with the tip
in the gastric antrum. Normal bowel gas pattern
IMPRESSION: Feeding tube tip in the gastric antrum.

## 2023-11-05 ENCOUNTER — Other Ambulatory Visit: Payer: Self-pay | Admitting: Internal Medicine

## 2023-11-05 DIAGNOSIS — R519 Headache, unspecified: Secondary | ICD-10-CM

## 2023-11-05 DIAGNOSIS — R1084 Generalized abdominal pain: Secondary | ICD-10-CM

## 2023-11-07 ENCOUNTER — Ambulatory Visit
Admission: RE | Admit: 2023-11-07 | Discharge: 2023-11-07 | Disposition: A | Discharge status: Home / Self Care | Source: Ambulatory Visit | Attending: Internal Medicine | Admitting: Internal Medicine

## 2023-11-07 DIAGNOSIS — R519 Headache, unspecified: Secondary | ICD-10-CM | POA: Insufficient documentation

## 2023-11-09 ENCOUNTER — Ambulatory Visit
Admission: RE | Admit: 2023-11-09 | Discharge: 2023-11-09 | Disposition: A | Discharge status: Home / Self Care | Source: Ambulatory Visit | Attending: Internal Medicine | Admitting: Internal Medicine

## 2023-11-09 DIAGNOSIS — R1084 Generalized abdominal pain: Secondary | ICD-10-CM

## 2024-07-19 ENCOUNTER — Other Ambulatory Visit: Payer: Self-pay | Admitting: Certified Nurse Midwife

## 2024-07-19 DIAGNOSIS — Z1231 Encounter for screening mammogram for malignant neoplasm of breast: Secondary | ICD-10-CM

## 2024-08-11 ENCOUNTER — Ambulatory Visit
Admission: RE | Admit: 2024-08-11 | Discharge: 2024-08-11 | Disposition: A | Discharge status: Home / Self Care | Source: Ambulatory Visit | Attending: Certified Nurse Midwife | Admitting: Certified Nurse Midwife

## 2024-08-11 DIAGNOSIS — Z1231 Encounter for screening mammogram for malignant neoplasm of breast: Secondary | ICD-10-CM | POA: Diagnosis present
